# Patient Record
Sex: Male | Born: 1950 | Race: White | Hispanic: No | Marital: Married | Smoking: Former smoker
Health system: Southern US, Community
[De-identification: ages and names within clinical notes are randomized; demographics above are authoritative.]

## PROBLEM LIST (undated history)

## (undated) DIAGNOSIS — C4490 Unspecified malignant neoplasm of skin, unspecified: Secondary | ICD-10-CM

## (undated) DIAGNOSIS — C801 Malignant (primary) neoplasm, unspecified: Secondary | ICD-10-CM

## (undated) DIAGNOSIS — F32A Depression, unspecified: Secondary | ICD-10-CM

## (undated) DIAGNOSIS — I10 Essential (primary) hypertension: Secondary | ICD-10-CM

## (undated) DIAGNOSIS — F329 Major depressive disorder, single episode, unspecified: Secondary | ICD-10-CM

## (undated) DIAGNOSIS — K219 Gastro-esophageal reflux disease without esophagitis: Secondary | ICD-10-CM

## (undated) DIAGNOSIS — R0602 Shortness of breath: Secondary | ICD-10-CM

## (undated) DIAGNOSIS — G4730 Sleep apnea, unspecified: Secondary | ICD-10-CM

## (undated) DIAGNOSIS — Z9289 Personal history of other medical treatment: Secondary | ICD-10-CM

## (undated) DIAGNOSIS — C3490 Malignant neoplasm of unspecified part of unspecified bronchus or lung: Secondary | ICD-10-CM

## (undated) DIAGNOSIS — C8590 Non-Hodgkin lymphoma, unspecified, unspecified site: Secondary | ICD-10-CM

## (undated) DIAGNOSIS — Z87442 Personal history of urinary calculi: Secondary | ICD-10-CM

## (undated) DIAGNOSIS — M1990 Unspecified osteoarthritis, unspecified site: Secondary | ICD-10-CM

## (undated) DIAGNOSIS — F419 Anxiety disorder, unspecified: Secondary | ICD-10-CM

## (undated) DIAGNOSIS — K37 Unspecified appendicitis: Secondary | ICD-10-CM

## (undated) DIAGNOSIS — J189 Pneumonia, unspecified organism: Secondary | ICD-10-CM

## (undated) HISTORY — DX: Non-Hodgkin lymphoma, unspecified, unspecified site: C85.90

## (undated) HISTORY — DX: Unspecified appendicitis: K37

## (undated) HISTORY — PX: APPENDECTOMY: SHX54

## (undated) HISTORY — PX: BASAL CELL CARCINOMA EXCISION: SHX1214

## (undated) HISTORY — PX: TESTICLE REMOVAL: SHX68

## (undated) HISTORY — PX: TONSILLECTOMY: SUR1361

## (undated) HISTORY — PX: SPINE SURGERY: SHX786

## (undated) HISTORY — DX: Major depressive disorder, single episode, unspecified: F32.9

## (undated) HISTORY — PX: FRACTURE SURGERY: SHX138

## (undated) HISTORY — DX: Depression, unspecified: F32.A

## (undated) HISTORY — DX: Anxiety disorder, unspecified: F41.9

## (undated) HISTORY — DX: Unspecified osteoarthritis, unspecified site: M19.90

## (undated) SURGERY — CHEST TUBE INSERTION
Anesthesia: LOCAL

---

## 1972-06-23 DIAGNOSIS — Z9289 Personal history of other medical treatment: Secondary | ICD-10-CM

## 1972-06-23 HISTORY — DX: Personal history of other medical treatment: Z92.89

## 2006-02-10 ENCOUNTER — Ambulatory Visit: Payer: Self-pay | Admitting: Gastroenterology

## 2006-02-19 ENCOUNTER — Ambulatory Visit: Payer: Self-pay | Admitting: Gastroenterology

## 2006-02-19 ENCOUNTER — Encounter (INDEPENDENT_AMBULATORY_CARE_PROVIDER_SITE_OTHER): Payer: Self-pay | Admitting: Specialist

## 2007-05-25 ENCOUNTER — Ambulatory Visit: Payer: Self-pay | Admitting: Gastroenterology

## 2007-06-01 ENCOUNTER — Ambulatory Visit: Payer: Self-pay | Admitting: Gastroenterology

## 2007-06-01 ENCOUNTER — Encounter: Payer: Self-pay | Admitting: Gastroenterology

## 2007-07-15 DIAGNOSIS — K2980 Duodenitis without bleeding: Secondary | ICD-10-CM | POA: Insufficient documentation

## 2007-07-15 DIAGNOSIS — K2990 Gastroduodenitis, unspecified, without bleeding: Secondary | ICD-10-CM

## 2007-07-15 DIAGNOSIS — M129 Arthropathy, unspecified: Secondary | ICD-10-CM | POA: Insufficient documentation

## 2007-07-15 DIAGNOSIS — Z860100 Personal history of colon polyps, unspecified: Secondary | ICD-10-CM | POA: Insufficient documentation

## 2007-07-15 DIAGNOSIS — K2970 Gastritis, unspecified, without bleeding: Secondary | ICD-10-CM | POA: Insufficient documentation

## 2007-07-15 DIAGNOSIS — K5730 Diverticulosis of large intestine without perforation or abscess without bleeding: Secondary | ICD-10-CM | POA: Insufficient documentation

## 2007-07-15 DIAGNOSIS — J309 Allergic rhinitis, unspecified: Secondary | ICD-10-CM | POA: Insufficient documentation

## 2007-07-15 DIAGNOSIS — K648 Other hemorrhoids: Secondary | ICD-10-CM | POA: Insufficient documentation

## 2009-12-10 ENCOUNTER — Encounter: Admission: RE | Admit: 2009-12-10 | Discharge: 2009-12-10 | Payer: Self-pay | Admitting: Internal Medicine

## 2009-12-17 ENCOUNTER — Ambulatory Visit (HOSPITAL_COMMUNITY): Admission: RE | Admit: 2009-12-17 | Discharge: 2009-12-17 | Payer: Self-pay | Admitting: Urology

## 2010-11-05 NOTE — Assessment & Plan Note (Signed)
[facility]                         GASTROENTEROLOGY OFFICE NOTE   BERL, BONFANTI                     MRN:          [id number]  DATE:05/25/2007                            DOB:          April 01, 1951    REFERRING PHYSICIAN:  Stan Head. Cleta Alberts, M.D.   REASON FOR CONSULTATION:  Hemoccult positive stool.   HISTORY OF PRESENT ILLNESS:  Mr. Chad Knapp is a 60 year old white male who  I previously evaluated with a screening colonoscopy in August 2007.  Findings included several hyperplastic polyps, diverticulosis and  internal hemorrhoids.  He takes Naprosyn on a routine basis for back and  shoulder pain.  Hemoccult cards were positive in August 2008.  He notes  chronic problems with intestinal gas.  He notes no change in bowel  habits, change in stool caliber, dysphagia, odynophagia, dyspeptic  symptoms, abdominal pain, rectal pain or weight loss.  No family history  of colon cancer, colon polyps or irritable bowel disease.   PAST MEDICAL HISTORY:  1. Diverticulosis.  2. Internal hemorrhoids.  3. Arthritis.  4. History of nephrolithiasis.  5. Seasonal allergic rhinitis status post TNA.  6. Status post left foot Morton neuroma removed.  7. Status post appendectomy.  8. Status post basal cell carcinoma removed from his chest.   CURRENT MEDICATIONS:  Naprosyn 500 mg b.i.d.   ALLERGIES:  None known.   REVIEW OF SYSTEMS:  Per the hand written form.   PHYSICAL EXAMINATION:  GENERAL:  Well-developed, well-nourished, well-  nourished in no acute distress.  VITAL SIGNS:  Height 6 feet, 3 inches, weight 218.2 pounds.  Blood  pressure 116/78, pulse 72 and regular.  HEENT:  Anicteric sclerae.  Oropharynx clear.  CHEST:  Clear to auscultation bilaterally.  CARDIAC:  Regular rate and rhythm without murmurs appreciated.  ABDOMEN:  Soft, nontender, nondistended.  Normoactive bowel sounds.  No  palpable organomegaly masses or hernias.  EXTREMITIES:  No cyanosis,  clubbing or edema.  NEUROLOGICAL:  Alert and oriented x3.  Grossly nonfocal.   LABORATORY DATA:  From April 06, 2007, shows a normal comprehensive  metabolic panel, lipid panel, CBC and PSA except for an elevated LDL at  119.   ASSESSMENT/PLAN:  Asymptomatic hemoccult positive stool on chronic  Naprosyn therapy.  Colonoscopy performed in August 2007.  Rule out NSAID  gastrointestinal injury.  Risks, benefits and alternatives to upper  endoscopy discussed with the patient, and he consents to proceed.  This  will be scheduled electively.     Venita Lick. Russella Dar, MD, [facility]  Electronically Signed    MTS/MedQ  DD: 05/31/2007  DT: 05/31/2007  Job #: [id number]   cc:   Brett Canales A. Cleta Alberts, M.D.

## 2011-11-18 ENCOUNTER — Ambulatory Visit (INDEPENDENT_AMBULATORY_CARE_PROVIDER_SITE_OTHER): Payer: Federal, State, Local not specified - PPO | Admitting: Family Medicine

## 2011-11-18 VITALS — BP 111/76 | HR 82 | Temp 98.0°F | Resp 16 | Ht 74.0 in | Wt 247.4 lb

## 2011-11-18 DIAGNOSIS — R05 Cough: Secondary | ICD-10-CM

## 2011-11-18 DIAGNOSIS — R059 Cough, unspecified: Secondary | ICD-10-CM

## 2011-11-18 DIAGNOSIS — J329 Chronic sinusitis, unspecified: Secondary | ICD-10-CM

## 2011-11-18 DIAGNOSIS — J302 Other seasonal allergic rhinitis: Secondary | ICD-10-CM

## 2011-11-18 DIAGNOSIS — J309 Allergic rhinitis, unspecified: Secondary | ICD-10-CM

## 2011-11-18 DIAGNOSIS — J029 Acute pharyngitis, unspecified: Secondary | ICD-10-CM

## 2011-11-18 MED ORDER — FLUTICASONE PROPIONATE 50 MCG/ACT NA SUSP: 2.0000 | Freq: Every day | NASAL | Status: DC

## 2011-11-18 MED ORDER — HYDROCODONE-HOMATROPINE 5-1.5 MG/5ML PO SYRP: 5.0000 mL | ORAL_SOLUTION | Freq: Three times a day (TID) | ORAL | Status: AC | PRN

## 2011-11-18 MED ORDER — AMOXICILLIN 875 MG PO TABS: 875.0000 mg | ORAL_TABLET | Freq: Two times a day (BID) | ORAL | Status: AC

## 2011-11-18 NOTE — Progress Notes (Signed)
  Subjective:    Patient ID: Chad Knapp, male    DOB: Oct 20, 1950, 61 y.o.   MRN: [id number]  HPI  Patient presents with 3 week history of  facial congestion and pressure, PND and cough.  PMH/ seasonal allergies  SH/ former smoker  Review of Systems  Constitutional: Negative for fever and chills.  HENT: Positive for congestion, rhinorrhea and postnasal drip. Negative for ear pain and facial swelling.   Respiratory: Positive for cough. Negative for shortness of breath.        Objective:   Physical Exam  Constitutional: He appears well-developed.  HENT:       Nares boggy (B)   Neck: Neck supple.  Cardiovascular: Normal rate, regular rhythm and normal heart sounds.   Pulmonary/Chest: Effort normal.       Coarse BS  Abdominal: Soft. Bowel sounds are normal.  Skin: Skin is warm.         Assessment & Plan:   1. Sinusitis  amoxicillin (AMOXIL) 875 MG tablet  2. Seasonal allergies  fluticasone (FLONASE) 50 MCG/ACT nasal spray  3. Cough  HYDROcodone-homatropine (HYCODAN) 5-1.5 MG/5ML syrup; ProAir HFA 1-2 puffs q 6 hours prn(has at home)    Anticipatory guidance

## 2011-11-20 ENCOUNTER — Encounter: Payer: Self-pay | Admitting: Family Medicine

## 2012-09-23 ENCOUNTER — Ambulatory Visit (INDEPENDENT_AMBULATORY_CARE_PROVIDER_SITE_OTHER): Payer: Federal, State, Local not specified - PPO | Admitting: Family Medicine

## 2012-09-23 VITALS — BP 120/90 | HR 103 | Temp 99.0°F | Resp 20 | Ht 74.0 in | Wt 247.0 lb

## 2012-09-23 DIAGNOSIS — J019 Acute sinusitis, unspecified: Secondary | ICD-10-CM

## 2012-09-23 MED ORDER — AZITHROMYCIN 250 MG PO TABS: ORAL_TABLET | ORAL | Status: DC

## 2012-09-23 MED ORDER — HYDROCODONE-HOMATROPINE 5-1.5 MG/5ML PO SYRP: 5.0000 mL | ORAL_SOLUTION | Freq: Four times a day (QID) | ORAL | Status: DC | PRN

## 2012-09-23 NOTE — Progress Notes (Signed)
62 year old gentleman who comes in with 1 week of progressive sinus congestion. He's also coughing frequently. He tried Mucinex and this has not helped him. He said no fever, hemoptysis, nausea or vomiting  Objective: Marked nasal passage congestion HEENT otherwise negative Chest: Few rhonchi otherwise clear  Assessment: Sinusitis with cough  Plan: No diagnosis found. Z-Pak and Hydromet with followup in 3 days if not better

## 2012-09-29 ENCOUNTER — Ambulatory Visit (INDEPENDENT_AMBULATORY_CARE_PROVIDER_SITE_OTHER): Payer: Federal, State, Local not specified - PPO | Admitting: Emergency Medicine

## 2012-09-29 VITALS — BP 120/78 | HR 73 | Temp 98.3°F | Resp 18 | Ht 74.0 in | Wt 250.8 lb

## 2012-09-29 DIAGNOSIS — J209 Acute bronchitis, unspecified: Secondary | ICD-10-CM

## 2012-09-29 MED ORDER — HYDROCOD POLST-CHLORPHEN POLST 10-8 MG/5ML PO LQCR: 5.0000 mL | Freq: Two times a day (BID) | ORAL | Status: DC | PRN

## 2012-09-29 NOTE — Progress Notes (Signed)
[facility] [address], [place] [address] [phone]  Date:  09/29/2012   Name:  Chad Knapp   DOB:  July 17, 1950   MRN:  [id number]  PCP:  No PCP Per Patient    Chief Complaint: Follow-up   History of Present Illness:  Chad Knapp is a 62 y.o. very pleasant male patient who presents with the following:  Seen a week ago and treated with zithromax for sinusitis.  Has persistent cough. Productive mucoid sputum. Clear mucoid nasal discharge.  No wheezing or shortness of breath.  No fever or chills.  No nausea or vomiting.  Completed course of zithromax and tussionex   No improvement with over the counter medications or other home remedies. Denies other complaint or health concern today.   Patient Active Problem List  Diagnosis  . HEMORRHOIDS, INTERNAL  . RHINITIS  . GASTRITIS  . DUODENITIS WITHOUT MENTION OF HEMORRHAGE  . DIVERTICULOSIS OF COLON  . ARTHRITIS  . COLONIC POLYPS, HYPERPLASTIC, HX OF    Past Medical History  Diagnosis Date  . Arthritis   . Depression   . Anxiety     Past Surgical History  Procedure Laterality Date  . Appendectomy    . Fracture surgery      History  Substance Use Topics  . Smoking status: Former Games developer  . Smokeless tobacco: Not on file  . Alcohol Use: 25.2 oz/week    42 Cans of beer per week    No family history on file.  No Known Allergies  Medication list has been reviewed and updated.  Current Outpatient Prescriptions on File Prior to Visit  Medication Sig Dispense Refill  . aspirin 81 MG tablet Take 81 mg by mouth daily.      Marland Kitchen azithromycin (ZITHROMAX) 250 MG tablet Take 2 tablets tonight. Take one tablet every day for the next 4 days.  6 each  0  . citalopram (CELEXA) 20 MG tablet Take 20 mg by mouth daily.      . fish oil-omega-3 fatty acids 1000 MG capsule Take 2 g by mouth daily.      Marland Kitchen ketoconazole (NIZORAL) 2 % cream Apply topically daily.      . multivitamin/minerals (GOLDEN AGE)  LIQD Take by mouth daily.      . naproxen (NAPROSYN) 500 MG tablet Take 500 mg by mouth 2 (two) times daily with a meal.      . dextromethorphan-guaiFENesin (MUCINEX DM) 30-600 MG per 12 hr tablet Take 1 tablet by mouth every 12 (twelve) hours.      . fluticasone (FLONASE) 50 MCG/ACT nasal spray Place 2 sprays into the nose daily.  1 g  6  . HYDROcodone-homatropine (HYCODAN) 5-1.5 MG/5ML syrup Take 5 mLs by mouth every 6 (six) hours as needed for cough.  120 mL  0   No current facility-administered medications on file prior to visit.    Review of Systems:  As per HPI, otherwise negative.    Physical Examination: Filed Vitals:   09/29/12 1830  BP: 120/78  Pulse: 73  Temp: 98.3 F (36.8 C)  Resp: 18   Filed Vitals:   09/29/12 1830  Height: 6\' 2"  (1.88 m)  Weight: 250 lb 12.8 oz (113.762 kg)   Body mass index is 32.19 kg/(m^2). Ideal Body Weight: Weight in (lb) to have BMI = 25: 194.3  GEN: WDWN, NAD, Non-toxic, A & O x 3 HEENT: Atraumatic, Normocephalic. Neck supple. No masses, No LAD. Ears and  Nose: No external deformity. CV: RRR, No M/G/R. No JVD. No thrill. No extra heart sounds. PULM: CTA B, no wheezes, crackles, rhonchi. No retractions. No resp. distress. No accessory muscle use. ABD: S, NT, ND, +BS. No rebound. No HSM. EXTR: No c/c/e NEURO Normal gait.  PSYCH: Normally interactive. Conversant. Not depressed or anxious appearing.  Calm demeanor.    Assessment and Plan: Bronchitis Bronchospasm Use MDI Refill tussionex   Signed,  Phillips Odor, MD

## 2012-09-29 NOTE — Patient Instructions (Signed)

## 2012-11-08 ENCOUNTER — Ambulatory Visit (INDEPENDENT_AMBULATORY_CARE_PROVIDER_SITE_OTHER): Payer: Federal, State, Local not specified - PPO | Admitting: Family Medicine

## 2012-11-08 VITALS — BP 122/72 | HR 86 | Temp 97.7°F | Resp 16 | Ht 75.0 in | Wt 256.0 lb

## 2012-11-08 DIAGNOSIS — M549 Dorsalgia, unspecified: Secondary | ICD-10-CM

## 2012-11-08 DIAGNOSIS — M129 Arthropathy, unspecified: Secondary | ICD-10-CM

## 2012-11-08 DIAGNOSIS — M5410 Radiculopathy, site unspecified: Secondary | ICD-10-CM

## 2012-11-08 DIAGNOSIS — M546 Pain in thoracic spine: Secondary | ICD-10-CM

## 2012-11-08 DIAGNOSIS — M542 Cervicalgia: Secondary | ICD-10-CM

## 2012-11-08 MED ORDER — HYDROCODONE-ACETAMINOPHEN 5-325 MG PO TABS: 1.0000 | ORAL_TABLET | Freq: Four times a day (QID) | ORAL | Status: DC | PRN

## 2012-11-08 MED ORDER — PREDNISONE 10 MG PO TABS: ORAL_TABLET | ORAL | Status: DC

## 2012-11-08 MED ORDER — CYCLOBENZAPRINE HCL 10 MG PO TABS: 10.0000 mg | ORAL_TABLET | Freq: Three times a day (TID) | ORAL | Status: DC | PRN
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
## 2012-11-08 NOTE — Patient Instructions (Addendum)
Back Injury Prevention Back injuries can be extremely painful and difficult to heal. After having one back injury, you are much more likely to experience another later on. It is important to learn how to avoid injuring or re-injuring your back. The following tips can help you to prevent a back injury. PHYSICAL FITNESS  Exercise regularly and try to develop good tone in your abdominal muscles. Your abdominal muscles provide a lot of the support needed by your back.  Do aerobic exercises (walking, jogging, biking, swimming) regularly.  Do exercises that increase balance and strength (tai chi, yoga) regularly. This can decrease your risk of falling and injuring your back.  Stretch before and after exercising.  Maintain a healthy weight. The more you weigh, the more stress is placed on your back. For every pound of weight, 10 times that amount of pressure is placed on the back. DIET  Talk to your caregiver about how much calcium and vitamin D you need per day. These nutrients help to prevent weakening of the bones (osteoporosis). Osteoporosis can cause broken (fractured) bones that lead to back pain.  Include good sources of calcium in your diet, such as dairy products, green, leafy vegetables, and products with calcium added (fortified).  Include good sources of vitamin D in your diet, such as milk and foods that are fortified with vitamin D.  Consider taking a nutritional supplement or a multivitamin if needed.  Stop smoking if you smoke. POSTURE  Sit and stand up straight. Avoid leaning forward when you sit or hunching over when you stand.  Choose chairs with good low back (lumbar) support.  If you work at a desk, sit close to your work so you do not need to lean over. Keep your chin tucked in. Keep your neck drawn back and elbows bent at a right angle. Your arms should look like the letter "L."  Sit high and close to the steering wheel when you drive. Add a lumbar support to your car  seat if needed.  Avoid sitting or standing in one position for too long. Take breaks to get up, stretch, and walk around at least once every hour. Take breaks if you are driving for long periods of time.  Sleep on your side with your knees slightly bent, or sleep on your back with a pillow under your knees. Do not sleep on your stomach. LIFTING, TWISTING, AND REACHING  Avoid heavy lifting, especially repetitive lifting. If you must do heavy lifting:  Stretch before lifting.  Work slowly.  Rest between lifts.  Use carts and dollies to move objects when possible.  Make several small trips instead of carrying 1 heavy load.  Ask for help when you need it.  Ask for help when moving big, awkward objects.  Follow these steps when lifting:  Stand with your feet shoulder-width apart.  Get as close to the object as you can. Do not try to pick up heavy objects that are far from your body.  Use handles or lifting straps if they are available.  Bend at your knees. Squat down, but keep your heels off the floor.  Keep your shoulders pulled back, your chin tucked in, and your back straight.  Lift the object slowly, tightening the muscles in your legs, abdomen, and buttocks. Keep the object as close to the center of your body as possible.  When you put a load down, use these same guidelines in reverse.  Do not:  Lift the object above your waist.    Twist at the waist while lifting or carrying a load. Move your feet if you need to turn, not your waist.  Bend over without bending at your knees.  Avoid reaching over your head, across a table, or for an object on a high surface. OTHER TIPS  Avoid wet floors and keep sidewalks clear of ice to prevent falls.  Do not sleep on a mattress that is too soft or too hard.  Keep items that are used frequently within easy reach.  Put heavier objects on shelves at waist level and lighter objects on lower or higher shelves.  Find ways to  decrease your stress, such as exercise, massage, or relaxation techniques. Stress can build up in your muscles. Tense muscles are more vulnerable to injury.  Seek treatment for depression or anxiety if needed. These conditions can increase your risk of developing back pain. SEEK MEDICAL CARE IF:  You injure your back.  You have questions about diet, exercise, or other ways to prevent back injuries. MAKE SURE YOU:  Understand these instructions.  Will watch your condition.  Will get help right away if you are not doing well or get worse. Document Released: 07/17/2004 Document Revised: 09/01/2011 Document Reviewed: 07/21/2011 [facility] Patient Information 2013 [place], [place]. Back Exercises These exercises may help you when beginning to rehabilitate your injury. Your symptoms may resolve with or without further involvement from your physician, physical therapist or athletic trainer. While completing these exercises, remember:   Restoring tissue flexibility helps normal motion to return to the joints. This allows healthier, less painful movement and activity.  An effective stretch should be held for at least 30 seconds.  A stretch should never be painful. You should only feel a gentle lengthening or release in the stretched tissue. STRETCH  Extension, Prone on Elbows   Lie on your stomach on the floor, a bed will be too soft. Place your palms about shoulder width apart and at the height of your head.  Place your elbows under your shoulders. If this is too painful, stack pillows under your chest.  Allow your body to relax so that your hips drop lower and make contact more completely with the floor.  Hold this position for __________ seconds.  Slowly return to lying flat on the floor. Repeat __________ times. Complete this exercise __________ times per day.  RANGE OF MOTION  Extension, Prone Press Ups   Lie on your stomach on the floor, a bed will be too soft. Place your palms about  shoulder width apart and at the height of your head.  Keeping your back as relaxed as possible, slowly straighten your elbows while keeping your hips on the floor. You may adjust the placement of your hands to maximize your comfort. As you gain motion, your hands will come more underneath your shoulders.  Hold this position __________ seconds.  Slowly return to lying flat on the floor. Repeat __________ times. Complete this exercise __________ times per day.  RANGE OF MOTION- Quadruped, Neutral Spine   Assume a hands and knees position on a firm surface. Keep your hands under your shoulders and your knees under your hips. You may place padding under your knees for comfort.  Drop your head and point your tail bone toward the ground below you. This will round out your low back like an angry cat. Hold this position for __________ seconds.  Slowly lift your head and release your tail bone so that your back sags into a large arch, like an  old horse.  Hold this position for __________ seconds.  Repeat this until you feel limber in your low back.  Now, find your "sweet spot." This will be the most comfortable position somewhere between the two previous positions. This is your neutral spine. Once you have found this position, tense your stomach muscles to support your low back.  Hold this position for __________ seconds. Repeat __________ times. Complete this exercise __________ times per day.  STRETCH  Flexion, Single Knee to Chest   Lie on a firm bed or floor with both legs extended in front of you.  Keeping one leg in contact with the floor, bring your opposite knee to your chest. Hold your leg in place by either grabbing behind your thigh or at your knee.  Pull until you feel a gentle stretch in your low back. Hold __________ seconds.  Slowly release your grasp and repeat the exercise with the opposite side. Repeat __________ times. Complete this exercise __________ times per day.    STRETCH - Hamstrings, Standing  Stand or sit and extend your right / left leg, placing your foot on a chair or foot stool  Keeping a slight arch in your low back and your hips straight forward.  Lead with your chest and lean forward at the waist until you feel a gentle stretch in the back of your right / left knee or thigh. (When done correctly, this exercise requires leaning only a small distance.)  Hold this position for __________ seconds. Repeat __________ times. Complete this stretch __________ times per day. STRENGTHENING  Deep Abdominals, Pelvic Tilt   Lie on a firm bed or floor. Keeping your legs in front of you, bend your knees so they are both pointed toward the ceiling and your feet are flat on the floor.  Tense your lower abdominal muscles to press your low back into the floor. This motion will rotate your pelvis so that your tail bone is scooping upwards rather than pointing at your feet or into the floor.  With a gentle tension and even breathing, hold this position for __________ seconds. Repeat __________ times. Complete this exercise __________ times per day.  STRENGTHENING  Abdominals, Crunches   Lie on a firm bed or floor. Keeping your legs in front of you, bend your knees so they are both pointed toward the ceiling and your feet are flat on the floor. Cross your arms over your chest.  Slightly tip your chin down without bending your neck.  Tense your abdominals and slowly lift your trunk high enough to just clear your shoulder blades. Lifting higher can put excessive stress on the low back and does not further strengthen your abdominal muscles.  Control your return to the starting position. Repeat __________ times. Complete this exercise __________ times per day.  STRENGTHENING  Quadruped, Opposite UE/LE Lift   Assume a hands and knees position on a firm surface. Keep your hands under your shoulders and your knees under your hips. You may place padding under your  knees for comfort.  Find your neutral spine and gently tense your abdominal muscles so that you can maintain this position. Your shoulders and hips should form a rectangle that is parallel with the floor and is not twisted.  Keeping your trunk steady, lift your right hand no higher than your shoulder and then your left leg no higher than your hip. Make sure you are not holding your breath. Hold this position __________ seconds.  Continuing to keep your abdominal muscles  tense and your back steady, slowly return to your starting position. Repeat with the opposite arm and leg. Repeat __________ times. Complete this exercise __________ times per day. Document Released: 06/27/2005 Document Revised: 09/01/2011 Document Reviewed: 09/21/2008 [facility] Patient Information 2013 [place], [place].

## 2012-11-22 ENCOUNTER — Ambulatory Visit (INDEPENDENT_AMBULATORY_CARE_PROVIDER_SITE_OTHER): Payer: Federal, State, Local not specified - PPO | Admitting: Family Medicine

## 2012-11-22 ENCOUNTER — Ambulatory Visit: Payer: Federal, State, Local not specified - PPO

## 2012-11-22 VITALS — BP 124/88 | HR 94 | Temp 97.8°F | Resp 18 | Ht 75.0 in | Wt 254.0 lb

## 2012-11-22 DIAGNOSIS — M546 Pain in thoracic spine: Secondary | ICD-10-CM

## 2012-11-22 DIAGNOSIS — M5010 Cervical disc disorder with radiculopathy, unspecified cervical region: Secondary | ICD-10-CM

## 2012-11-22 DIAGNOSIS — M5412 Radiculopathy, cervical region: Secondary | ICD-10-CM

## 2012-11-22 MED ORDER — TRIAMCINOLONE ACETONIDE 40 MG/ML IJ SUSP
40.0000 mg | Freq: Once | INTRAMUSCULAR | Status: AC
  Administered 2012-11-22: 40 mg via INTRAMUSCULAR

## 2012-11-22 MED ORDER — OXYCODONE-ACETAMINOPHEN 5-325 MG PO TABS: 1.0000 | ORAL_TABLET | Freq: Four times a day (QID) | ORAL | Status: DC | PRN

## 2012-11-22 MED ORDER — AMITRIPTYLINE HCL 50 MG PO TABS: 50.0000 mg | ORAL_TABLET | Freq: Every day | ORAL | Status: DC

## 2012-11-22 NOTE — Progress Notes (Signed)
Subjective:    Patient ID: Chad Knapp, male    DOB: 1950-12-13, 62 y.o.   MRN: [id number] Chief Complaint  Patient presents with  . Shoulder Pain    Recheck  . Arm Pain   HPI  Better than it was 2 wks ago - down to 7/10 from 10/10 - pain from shoulder blade radiating down into forearm and 2nd finger numb and feels like it has needles in it.  Hydrocodone and flexeril just knocked off the edge of it -  Couldn't tell any difference on the prednisone.  Naproxen doesn't touch it. Has a point on his posterior shoulder that he can press and it radiates down his back. He can psuh certain areas of his forearm that if he pushes his left 2nd finger becomes more numb. Did get a skin biopsy at the [place]. They did a subcu injection with lidocaine that temp gave his whole arm relief but now wearing off. Turning towards the left with his neck causes more pain and laying on left side is very bad (laying on back or right is ok) No ortho doc Right handed so doesn't know if any weakness in left.  Past Medical History  Diagnosis Date  . Arthritis   . Depression   . Anxiety    Current Outpatient Prescriptions on File Prior to Visit  Medication Sig Dispense Refill  . aspirin 81 MG tablet Take 81 mg by mouth daily.      . citalopram (CELEXA) 20 MG tablet Take 20 mg by mouth daily.      . fish oil-omega-3 fatty acids 1000 MG capsule Take 2 g by mouth daily.      Marland Kitchen ketoconazole (NIZORAL) 2 % cream Apply topically daily.      . multivitamin/minerals (GOLDEN AGE) LIQD Take by mouth daily.      . naproxen (NAPROSYN) 500 MG tablet Take 500 mg by mouth 2 (two) times daily with a meal.      . fluticasone (FLONASE) 50 MCG/ACT nasal spray Place 2 sprays into the nose daily.  1 g  6   No current facility-administered medications on file prior to visit.   No Known Allergies  Review of Systems  Constitutional: Positive for activity change. Negative for fever, chills, diaphoresis and appetite change.  HENT:  Negative for neck pain and neck stiffness.   Musculoskeletal: Positive for myalgias, back pain and arthralgias. Negative for joint swelling and gait problem.  Skin: Negative for color change, rash and wound.  Neurological: Positive for numbness. Negative for tremors and weakness.  Hematological: Negative for adenopathy. Does not bruise/bleed easily.  Psychiatric/Behavioral: Positive for sleep disturbance.      BP 124/88  Pulse 94  Temp(Src) 97.8 F (36.6 C) (Oral)  Resp 18  Ht 6\' 3"  (1.905 m)  Wt 254 lb (115.214 kg)  BMI 31.75 kg/m2  SpO2 96% Objective:   Physical Exam  Constitutional: He is oriented to person, place, and time. He appears well-developed and well-nourished. No distress.  HENT:  Head: Normocephalic and atraumatic.  Eyes: No scleral icterus.  Pulmonary/Chest: Effort normal.  Musculoskeletal:       Left shoulder: He exhibits pain, spasm and decreased strength. He exhibits normal range of motion, no bony tenderness, no swelling and no effusion.       Left wrist: He exhibits normal range of motion, no tenderness and no bony tenderness.       Cervical back: He exhibits decreased range of motion and spasm. He exhibits  no tenderness, no bony tenderness and no pain.       Left hand: Decreased sensation noted. Decreased sensation is present in the radial distribution. Normal strength noted. He exhibits no finger abduction, no thumb/finger opposition and no wrist extension trouble.  Markedly decreased ROM with ear to shoulder and cervical extension.  Neurological: He is alert and oriented to person, place, and time. He displays no atrophy. He exhibits abnormal muscle tone.  Reflex Scores:      Tricep reflexes are 2+ on the right side and 2+ on the left side.      Bicep reflexes are 2+ on the right side and 2+ on the left side.      Brachioradialis reflexes are 2+ on the right side and 2+ on the left side. Neg Tinel's and Phalen's  Skin: Skin is warm and dry. He is not  diaphoretic.  Psychiatric: He has a normal mood and affect. His behavior is normal.     [facility] reading (PRIMARY) by  Dr. Clelia Croft. C-spine: C6 anterior inferior edge with bony spur separate - send for stat over-read - Mild Deg change only  Verbal consent obtained. Area of greatest pain over left upper trap cleaned w/ betadine x 2 and anesthetized with cold spray.  40mg  of Kenolog w/ 2cc 1% lidocaine mixed injected IM and fanned out with 25g needle. Pt tolerated procedure well. No comp, no EBL. Assessment & Plan:  Back pain, thoracic - Plan: DG Cervical Spine 2 or 3 views, triamcinolone acetonide (KENALOG-40) injection 40 mg - Attempted trigger point injection today for diagnostic and therapeutic reasons  Cervical disc disorder with radiculopathy of cervical region - Plan: Ambulatory referral to Orthopedic Surgery - I am concerned pt is having a C6 radiculopathy from his neck - though he localizes most of his pain to his shoulder and neck xray benign so could be more of a brachial plexus issue - will ask ortho to eval pt to help determine etiology of sxs and due to no sig improvement over 4-5 wks of pain now.  In the meantime, will start trial of elavil qhs and try percocet prn during the day.  Note given for 1 wk out of work.  Meds ordered this encounter  Medications  . triamcinolone acetonide (KENALOG-40) injection 40 mg    Sig:   . amitriptyline (ELAVIL) 50 MG tablet    Sig: Take 1 tablet (50 mg total) by mouth at bedtime.    Dispense:  30 tablet    Refill:  1  . oxyCODONE-acetaminophen (ROXICET) 5-325 MG per tablet    Sig: Take 1 tablet by mouth every 6 (six) hours as needed for pain.    Dispense:  40 tablet    Refill:  0

## 2013-01-12 NOTE — H&P (Signed)
History of Present Illness The patient is a 62 year old male who presents with neck pain. The patient is seen today in referral from [facility] [address]. The patient reports symptoms involving the left lateral neck which began 3 week(s) ago. The symptoms began without any known injury. Symptoms include neck pain, shoulder pain (left) and numbness (left index finger). The pain radiates to the left shoulder and left forearm. The patient describes the pain as sharp, dull and throbbing (at times with use). Onset was gradual. The symptoms occur frequently.The patient does feel that the symptoms are unchanged. Prior to being seen today the patient was previously evaluated in urgent care. Past evaluation has included cervical spine x-rays. Past treatment has included opioid analgesics and spinal injections (cortisone).  Subjective Transcription  The patient does not have a family physician. He presents with a three week history of atraumatic neck pain and radiation into the left arm. He states he is right hand dominant but he has been having horrific left arm pain. He was referred to me by the Urgent Care.      Family History Cancer. father   Social History Alcohol use. current drinker; drinks beer; less than 5 per week Children. 2 Current work status. working full time Drug/Alcohol Rehab (Currently). no Drug/Alcohol Rehab (Previously). no Exercise. Exercises rarely; does running / walking Illicit drug use. no Living situation. live with spouse Marital status. married Number of flights of stairs before winded. 2-3 Pain Contract. no Tobacco / smoke exposure. no Tobacco use. former smoker; smoke(d) less than 1/2 pack(s) per day   Past Surgical History Appendectomy Foot Surgery. left Other Orthopaedic Surgery Tonsillectomy   Other Problems Anxiety Disorder Diverticulitis Of Colon Kidney Stone Skin Cancer Sleep Apnea   Review  of Systems General:Present- Fatigue. Not Present- Chills, Fever, Night Sweats, Appetite Loss, Feeling sick, Weight Gain and Weight Loss. Respiratory:Present- Dyspnea. Not Present- Snoring, Chronic Cough and Bloody sputum. Cardiovascular:Present- Shortness of Breath. Not Present- Chest Pain, Swelling of Extremities, Leg Cramps and Palpitations. Musculoskeletal:Present- Back Pain. Not Present- Muscle Weakness, Muscle Pain, Joint Stiffness, Joint Swelling and Joint Pain. Neurological:Present- Numbness. Not Present- Tingling, Burning, Tremor, Headaches and Dizziness.   Vitals 11/25/2012 10:22 AM Weight: 254 lb Height: 75 in Body Surface Area: 2.47 m Body Mass Index: 31.75 kg/m BP: 124/88 (Sitting, Left Arm, Standard)    Objective Transcription  He is a pleasant gentleman appearing younger than his stated age. Alert and oriented times three. He has neck pain with range of motion to palpation. He does not have any significant shoulder, elbow or wrist pain with range of motion. He does have a trace weakness of the biceps and triceps on the left side and diminished sensation to light touch, predominantly in the C6 distribution. He ambulates well without any gait abnormality. Negative Hoffman's sign. There are 2+ symmetrical DTR's. Negative Babinski. No clonus. Normal gait pattern. He has a positive Spurling's sign on the left side.   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 A C2-3 there is a left sided disc hard disc osteophyte, at C3-4 there is a left hard disc osteophyte, both causing moderate to severe left foraminal narrowing. There is also left facet arthrosis that is moderate to severe at both those levels. C4-5 there is minimal disease. C5-6 there is facet arthrosis that is moderate to severe on the left with advanced left facet arthrosis. At C6-7 there is central and to the left hard disc osteophyte causing foraminal  stenosis.  Plans  Transcription(Byrd Rushlow Sheela Stack, MD; 12/17/2012 11:50 AM)  At this point in time the patient's clinical symptoms are consistent with C6-7 radiculopathy. While he does have significant problems in the upper cervical spine, I do not think these are causing his radicular symptoms in the arm. He has negative Babinski test. Negative Hoffman's sign. No clonus. Normal gait pattern. There is no evidence of suggest cervical spondylitic myelopathy and further more there are no cord signal changes.    At this point I would like to try a cervical epidural steroid injection specifically a C7 selective nerve root block. The patient is focused on mostly triceps and extensor surface of the forearm pain consistent with C7 nerve distribution. If there is significant improvement with this then we can at least begin to localize his pain source. I would not be surprised if there is only partial recovery as he has multiple areas, but clinically this is here he complains the most of. I will see him back after he has the C7 selective nerve root block and then we can determine how best to proceed at that time.  Patient noted marked temp. Improvement after injection.  Will proceed with ACDF of C6/7.  l Risks of surgery include, but are not limited to: Throat pain,swallowing difficulty, hoarseness or change in voice, Death, stroke, paralysis, nerve root damage/injury, bleeding, blood clots, loss of bowel/bladder control, hardware failure, or malposition, spinal fluid leak, adjacent segment disease, non-union, need for further surgery, ongoing or worse pain, infection and recurrent disc herniation

## 2013-01-24 ENCOUNTER — Encounter (HOSPITAL_COMMUNITY): Payer: Self-pay | Admitting: Pharmacy Technician

## 2013-01-24 ENCOUNTER — Encounter: Payer: Self-pay | Admitting: Gastroenterology

## 2013-01-27 ENCOUNTER — Encounter (HOSPITAL_COMMUNITY): Payer: Self-pay

## 2013-01-27 ENCOUNTER — Encounter (HOSPITAL_COMMUNITY)
Admission: RE | Admit: 2013-01-27 | Discharge: 2013-01-27 | Disposition: A | Discharge status: Home / Self Care | Payer: Federal, State, Local not specified - PPO | Source: Ambulatory Visit | Attending: Orthopedic Surgery | Admitting: Orthopedic Surgery

## 2013-01-27 ENCOUNTER — Other Ambulatory Visit (HOSPITAL_COMMUNITY): Payer: Federal, State, Local not specified - PPO

## 2013-01-27 DIAGNOSIS — Z01812 Encounter for preprocedural laboratory examination: Secondary | ICD-10-CM | POA: Insufficient documentation

## 2013-01-27 DIAGNOSIS — Z01818 Encounter for other preprocedural examination: Secondary | ICD-10-CM | POA: Insufficient documentation

## 2013-01-27 HISTORY — DX: Personal history of other medical treatment: Z92.89

## 2013-01-27 HISTORY — DX: Sleep apnea, unspecified: G47.30

## 2013-01-27 HISTORY — DX: Malignant (primary) neoplasm, unspecified: C80.1

## 2013-01-27 LAB — COMPREHENSIVE METABOLIC PANEL
AST: 21 U/L (ref 0–37)
CO2: 23 mEq/L (ref 19–32)
Calcium: 9.2 mg/dL (ref 8.4–10.5)
Creatinine, Ser: 0.9 mg/dL (ref 0.50–1.35)
GFR calc Af Amer: >90 mL/min (ref >90)
GFR calc non Af Amer: 89 mL/min — ABNORMAL LOW (ref >90)
Glucose, Bld: 103 mg/dL — ABNORMAL HIGH (ref 70–99)

## 2013-01-27 LAB — CBC
MCH: 32 pg (ref 26.0–34.0)
MCV: 90.7 fL (ref 78.0–100.0)
Platelets: 145 10*3/uL — ABNORMAL LOW (ref 150–400)
RBC: 4.94 MIL/uL (ref 4.22–5.81)

## 2013-01-27 NOTE — Progress Notes (Signed)
Primary physician - [facility] on [address] Does not have cardiologist Echo at [facility] - patient states was normal

## 2013-01-27 NOTE — Pre-Procedure Instructions (Addendum)
Chad Knapp  01/27/2013   Your procedure is scheduled on:  Thursday, August 14th  Report to [name] at 0530 AM.  Call this number if you have problems the morning of surgery: [phone]   Remember:   Do not eat food or drink liquids after midnight.   Take these medicines the morning of surgery with A SIP OF WATER: Celexa, Flonase, Albuterol if needed Stop taking aspirin, fish oil, naproxen 5 days prior to surgery   Do not wear jewelry.  Do not wear lotions, powders, or perfume, deodorant.  Do not bring valuables to the hospital.  [facility] is not responsible  for any belongings or valuables.  Contacts, dentures or bridgework may not be worn into surgery.  Leave suitcase in the car. After surgery it may be brought to your room.  For patients admitted to the hospital, checkout time is 11:00 AM the day of discharge.   Patients discharged the day of surgery will not be allowed to drive home.   Special Instructions: Shower using CHG 2 nights before surgery and the night before surgery.  If you shower the day of surgery use CHG.  Use special wash - you have one bottle of CHG for all showers.  You should use approximately 1/3 of the bottle for each shower.   Please read over the following fact sheets that you were given: Pain Booklet, Coughing and Deep Breathing, MRSA Information and Surgical Site Infection Prevention

## 2013-02-02 MED ORDER — CEFAZOLIN SODIUM-DEXTROSE 2-3 GM-% IV SOLR
2.0000 g | INTRAVENOUS | Status: AC
  Administered 2013-02-03: 2 g via INTRAVENOUS
  Filled 2013-02-02: qty 50

## 2013-02-02 MED ORDER — DEXAMETHASONE SODIUM PHOSPHATE 4 MG/ML IJ SOLN
4.0000 mg | Freq: Once | INTRAMUSCULAR | Status: AC
  Administered 2013-02-03: 4 mg via INTRAVENOUS
  Filled 2013-02-02: qty 1

## 2013-02-02 MED ORDER — ACETAMINOPHEN 10 MG/ML IV SOLN
1000.0000 mg | Freq: Once | INTRAVENOUS | Status: AC
  Administered 2013-02-03: 1000 mg via INTRAVENOUS
  Filled 2013-02-02: qty 100

## 2013-02-03 ENCOUNTER — Observation Stay (HOSPITAL_COMMUNITY): Payer: Federal, State, Local not specified - PPO

## 2013-02-03 ENCOUNTER — Encounter (HOSPITAL_COMMUNITY): Payer: Self-pay | Admitting: Certified Registered Nurse Anesthetist

## 2013-02-03 ENCOUNTER — Encounter (HOSPITAL_COMMUNITY)
Admission: RE | Disposition: A | Discharge status: Home / Self Care | Payer: Self-pay | Source: Ambulatory Visit | Attending: Orthopedic Surgery

## 2013-02-03 ENCOUNTER — Ambulatory Visit (HOSPITAL_COMMUNITY): Payer: Federal, State, Local not specified - PPO | Admitting: Anesthesiology

## 2013-02-03 ENCOUNTER — Observation Stay (HOSPITAL_COMMUNITY)
Admission: RE | Admit: 2013-02-03 | Discharge: 2013-02-04 | Disposition: A | Discharge status: Home / Self Care | Payer: Federal, State, Local not specified - PPO | Source: Ambulatory Visit | Attending: Orthopedic Surgery | Admitting: Orthopedic Surgery

## 2013-02-03 ENCOUNTER — Encounter (HOSPITAL_COMMUNITY): Payer: Self-pay | Admitting: Anesthesiology

## 2013-02-03 DIAGNOSIS — Z01812 Encounter for preprocedural laboratory examination: Secondary | ICD-10-CM | POA: Insufficient documentation

## 2013-02-03 DIAGNOSIS — Z01818 Encounter for other preprocedural examination: Secondary | ICD-10-CM | POA: Insufficient documentation

## 2013-02-03 DIAGNOSIS — R262 Difficulty in walking, not elsewhere classified: Secondary | ICD-10-CM | POA: Insufficient documentation

## 2013-02-03 DIAGNOSIS — M5412 Radiculopathy, cervical region: Principal | ICD-10-CM | POA: Insufficient documentation

## 2013-02-03 HISTORY — PX: ANTERIOR CERVICAL DECOMP/DISCECTOMY FUSION: SHX1161

## 2013-02-03 HISTORY — PX: CERVICAL SPINE SURGERY: SHX589

## 2013-02-03 HISTORY — DX: Shortness of breath: R06.02

## 2013-02-03 HISTORY — PX: CERVICAL FUSION: SHX112

## 2013-02-03 SURGERY — ANTERIOR CERVICAL DECOMPRESSION/DISCECTOMY FUSION 1 LEVEL
Anesthesia: General | Site: Spine Cervical | Wound class: Clean

## 2013-02-03 MED ORDER — OXYCODONE HCL 5 MG PO TABS
ORAL_TABLET | ORAL | Status: AC
  Administered 2013-02-03: 5 mg
  Filled 2013-02-03: qty 1

## 2013-02-03 MED ORDER — HYDROMORPHONE HCL PF 1 MG/ML IJ SOLN
INTRAMUSCULAR | Status: AC
  Filled 2013-02-03: qty 1

## 2013-02-03 MED ORDER — MIDAZOLAM HCL 5 MG/5ML IJ SOLN
INTRAMUSCULAR | Status: DC | PRN
  Administered 2013-02-03: 2 mg via INTRAVENOUS

## 2013-02-03 MED ORDER — ACETAMINOPHEN 10 MG/ML IV SOLN
1000.0000 mg | Freq: Four times a day (QID) | INTRAVENOUS | Status: DC
  Administered 2013-02-03 – 2013-02-04 (×3): 1000 mg via INTRAVENOUS
  Filled 2013-02-03 (×4): qty 100

## 2013-02-03 MED ORDER — DEXAMETHASONE SODIUM PHOSPHATE 4 MG/ML IJ SOLN
4.0000 mg | Freq: Four times a day (QID) | INTRAMUSCULAR | Status: DC
  Administered 2013-02-03 (×2): 4 mg via INTRAVENOUS
  Filled 2013-02-03 (×8): qty 1

## 2013-02-03 MED ORDER — CEFAZOLIN SODIUM 1-5 GM-% IV SOLN
1.0000 g | Freq: Three times a day (TID) | INTRAVENOUS | Status: AC
  Administered 2013-02-03 (×2): 1 g via INTRAVENOUS
  Filled 2013-02-03 (×2): qty 50

## 2013-02-03 MED ORDER — BUPIVACAINE-EPINEPHRINE 0.25% -1:200000 IJ SOLN
INTRAMUSCULAR | Status: DC | PRN
  Administered 2013-02-03: 3 mL

## 2013-02-03 MED ORDER — THROMBIN 20000 UNITS EX SOLR
CUTANEOUS | Status: AC
  Filled 2013-02-03: qty 20000

## 2013-02-03 MED ORDER — OXYCODONE HCL 5 MG PO TABS
10.0000 mg | ORAL_TABLET | ORAL | Status: DC | PRN
  Administered 2013-02-03 – 2013-02-04 (×3): 10 mg via ORAL
  Filled 2013-02-03 (×3): qty 2

## 2013-02-03 MED ORDER — MENTHOL 3 MG MT LOZG: 1.0000 | LOZENGE | OROMUCOSAL | Status: DC | PRN

## 2013-02-03 MED ORDER — LACTATED RINGERS IV SOLN
INTRAVENOUS | Status: DC | PRN
  Administered 2013-02-03: 08:00:00 via INTRAVENOUS

## 2013-02-03 MED ORDER — FENTANYL CITRATE 0.05 MG/ML IJ SOLN
INTRAMUSCULAR | Status: DC | PRN
  Administered 2013-02-03 (×2): 50 ug via INTRAVENOUS
  Administered 2013-02-03: 100 ug via INTRAVENOUS
  Administered 2013-02-03: 50 ug via INTRAVENOUS
  Administered 2013-02-03: 100 ug via INTRAVENOUS

## 2013-02-03 MED ORDER — OXYCODONE HCL 5 MG PO TABS: 5.0000 mg | ORAL_TABLET | Freq: Once | ORAL | Status: DC | PRN

## 2013-02-03 MED ORDER — CITALOPRAM HYDROBROMIDE 20 MG PO TABS
20.0000 mg | ORAL_TABLET | Freq: Every day | ORAL | Status: DC
  Administered 2013-02-04: 20 mg via ORAL
  Filled 2013-02-03: qty 1

## 2013-02-03 MED ORDER — NEOSTIGMINE METHYLSULFATE 1 MG/ML IJ SOLN
INTRAMUSCULAR | Status: DC | PRN
  Administered 2013-02-03: 5 mg via INTRAVENOUS

## 2013-02-03 MED ORDER — 0.9 % SODIUM CHLORIDE (POUR BTL) OPTIME
TOPICAL | Status: DC | PRN
  Administered 2013-02-03: 1000 mL

## 2013-02-03 MED ORDER — METHOCARBAMOL 500 MG PO TABS
ORAL_TABLET | ORAL | Status: AC
  Administered 2013-02-03: 500 mg
  Filled 2013-02-03: qty 1

## 2013-02-03 MED ORDER — TERBINAFINE HCL 1 % EX CREA: 1.0000 "application " | TOPICAL_CREAM | Freq: Two times a day (BID) | CUTANEOUS | Status: DC

## 2013-02-03 MED ORDER — DOCUSATE SODIUM 100 MG PO CAPS
100.0000 mg | ORAL_CAPSULE | Freq: Two times a day (BID) | ORAL | Status: DC
  Administered 2013-02-03 – 2013-02-04 (×2): 100 mg via ORAL
  Filled 2013-02-03 (×3): qty 1

## 2013-02-03 MED ORDER — ALBUTEROL SULFATE HFA 108 (90 BASE) MCG/ACT IN AERS
2.0000 | INHALATION_SPRAY | RESPIRATORY_TRACT | Status: DC | PRN
  Filled 2013-02-03: qty 6.7

## 2013-02-03 MED ORDER — METHOCARBAMOL 500 MG PO TABS
500.0000 mg | ORAL_TABLET | Freq: Four times a day (QID) | ORAL | Status: DC | PRN
  Administered 2013-02-03 – 2013-02-04 (×2): 500 mg via ORAL
  Filled 2013-02-03 (×2): qty 1

## 2013-02-03 MED ORDER — ARTIFICIAL TEARS OP OINT
TOPICAL_OINTMENT | OPHTHALMIC | Status: DC | PRN
  Administered 2013-02-03: 1 via OPHTHALMIC

## 2013-02-03 MED ORDER — SODIUM CHLORIDE 0.9 % IJ SOLN
3.0000 mL | Freq: Two times a day (BID) | INTRAMUSCULAR | Status: DC
  Administered 2013-02-03: 3 mL via INTRAVENOUS

## 2013-02-03 MED ORDER — PROMETHAZINE HCL 25 MG/ML IJ SOLN: 6.2500 mg | INTRAMUSCULAR | Status: DC | PRN

## 2013-02-03 MED ORDER — DEXTROSE 5 % IV SOLN: 500.0000 mg | Freq: Four times a day (QID) | INTRAVENOUS | Status: DC | PRN

## 2013-02-03 MED ORDER — HYDROMORPHONE HCL PF 1 MG/ML IJ SOLN
0.2500 mg | INTRAMUSCULAR | Status: DC | PRN
  Administered 2013-02-03 (×4): 0.5 mg via INTRAVENOUS

## 2013-02-03 MED ORDER — HEMOSTATIC AGENTS (NO CHARGE) OPTIME
TOPICAL | Status: DC | PRN
  Administered 2013-02-03: 1 via TOPICAL

## 2013-02-03 MED ORDER — ONDANSETRON HCL 4 MG/2ML IJ SOLN
INTRAMUSCULAR | Status: DC | PRN
  Administered 2013-02-03: 4 mg via INTRAVENOUS

## 2013-02-03 MED ORDER — LACTATED RINGERS IV SOLN
INTRAVENOUS | Status: DC | PRN
  Administered 2013-02-03 (×2): via INTRAVENOUS

## 2013-02-03 MED ORDER — LIDOCAINE HCL (CARDIAC) 20 MG/ML IV SOLN
INTRAVENOUS | Status: DC | PRN
  Administered 2013-02-03: 90 mg via INTRAVENOUS

## 2013-02-03 MED ORDER — PHENOL 1.4 % MT LIQD
1.0000 | OROMUCOSAL | Status: DC | PRN
  Filled 2013-02-03: qty 177

## 2013-02-03 MED ORDER — ONDANSETRON HCL 4 MG/2ML IJ SOLN
4.0000 mg | INTRAMUSCULAR | Status: DC | PRN
  Administered 2013-02-03: 4 mg via INTRAVENOUS
  Filled 2013-02-03: qty 2

## 2013-02-03 MED ORDER — OXYCODONE HCL 5 MG/5ML PO SOLN: 5.0000 mg | Freq: Once | ORAL | Status: DC | PRN

## 2013-02-03 MED ORDER — LACTATED RINGERS IV SOLN
INTRAVENOUS | Status: DC
  Administered 2013-02-03: 12:00:00 via INTRAVENOUS

## 2013-02-03 MED ORDER — PROPOFOL 10 MG/ML IV BOLUS
INTRAVENOUS | Status: DC | PRN
  Administered 2013-02-03: 200 mg via INTRAVENOUS
  Administered 2013-02-03: 100 mg via INTRAVENOUS

## 2013-02-03 MED ORDER — KETOCONAZOLE 2 % EX CREA
1.0000 "application " | TOPICAL_CREAM | Freq: Two times a day (BID) | CUTANEOUS | Status: DC
  Filled 2013-02-03: qty 15

## 2013-02-03 MED ORDER — SODIUM CHLORIDE 0.9 % IV SOLN: 250.0000 mL | INTRAVENOUS | Status: DC

## 2013-02-03 MED ORDER — BUPIVACAINE-EPINEPHRINE PF 0.25-1:200000 % IJ SOLN
INTRAMUSCULAR | Status: AC
  Filled 2013-02-03: qty 30

## 2013-02-03 MED ORDER — ZOLPIDEM TARTRATE 5 MG PO TABS: 5.0000 mg | ORAL_TABLET | Freq: Every evening | ORAL | Status: DC | PRN

## 2013-02-03 MED ORDER — SODIUM CHLORIDE 0.9 % IJ SOLN: 3.0000 mL | INTRAMUSCULAR | Status: DC | PRN

## 2013-02-03 MED ORDER — PHENYLEPHRINE HCL 10 MG/ML IJ SOLN
INTRAMUSCULAR | Status: DC | PRN
  Administered 2013-02-03 (×2): 80 ug via INTRAVENOUS

## 2013-02-03 MED ORDER — MORPHINE SULFATE 2 MG/ML IJ SOLN
1.0000 mg | INTRAMUSCULAR | Status: DC | PRN
  Administered 2013-02-03 (×2): 2 mg via INTRAVENOUS
  Filled 2013-02-03 (×2): qty 1

## 2013-02-03 MED ORDER — ROCURONIUM BROMIDE 100 MG/10ML IV SOLN
INTRAVENOUS | Status: DC | PRN
  Administered 2013-02-03: 20 mg via INTRAVENOUS
  Administered 2013-02-03: 50 mg via INTRAVENOUS

## 2013-02-03 MED ORDER — SUCCINYLCHOLINE CHLORIDE 20 MG/ML IJ SOLN
INTRAMUSCULAR | Status: DC | PRN
  Administered 2013-02-03: 140 mg via INTRAVENOUS

## 2013-02-03 MED ORDER — DEXAMETHASONE 4 MG PO TABS
4.0000 mg | ORAL_TABLET | Freq: Four times a day (QID) | ORAL | Status: DC
  Administered 2013-02-03 – 2013-02-04 (×2): 4 mg via ORAL
  Filled 2013-02-03 (×10): qty 1

## 2013-02-03 MED ORDER — GLYCOPYRROLATE 0.2 MG/ML IJ SOLN
INTRAMUSCULAR | Status: DC | PRN
  Administered 2013-02-03: .8 mg via INTRAVENOUS

## 2013-02-03 MED ORDER — THROMBIN 20000 UNITS EX SOLR
CUTANEOUS | Status: DC | PRN
  Administered 2013-02-03: 09:00:00 via TOPICAL

## 2013-02-03 MED ORDER — FLUTICASONE PROPIONATE 50 MCG/ACT NA SUSP
2.0000 | Freq: Every day | NASAL | Status: DC | PRN
  Filled 2013-02-03: qty 16

## 2013-02-03 SURGICAL SUPPLY — 54 items
BLADE SURG ROTATE 9660 (MISCELLANEOUS) IMPLANT
BUR EGG ELITE 4.0 (BURR) IMPLANT
BUR MATCHSTICK NEURO 3.0 LAGG (BURR) IMPLANT
CANISTER SUCTION 2500CC (MISCELLANEOUS) ×2 IMPLANT
CLOTH BEACON ORANGE TIMEOUT ST (SAFETY) ×2 IMPLANT
CLSR STERI-STRIP ANTIMIC 1/2X4 (GAUZE/BANDAGES/DRESSINGS) ×2 IMPLANT
CORDS BIPOLAR (ELECTRODE) ×2 IMPLANT
COVER SURGICAL LIGHT HANDLE (MISCELLANEOUS) ×4 IMPLANT
CRADLE DONUT ADULT HEAD (MISCELLANEOUS) ×2 IMPLANT
DRAPE C-ARM 42X72 X-RAY (DRAPES) ×2 IMPLANT
DRAPE POUCH INSTRU U-SHP 10X18 (DRAPES) ×2 IMPLANT
DRAPE SURG 17X23 STRL (DRAPES) ×2 IMPLANT
DRAPE U-SHAPE 47X51 STRL (DRAPES) ×2 IMPLANT
DRSG MEPILEX BORDER 4X4 (GAUZE/BANDAGES/DRESSINGS) ×2 IMPLANT
DURAPREP 26ML APPLICATOR (WOUND CARE) ×2 IMPLANT
ELECT COATED BLADE 2.86 ST (ELECTRODE) ×2 IMPLANT
ELECT REM PT RETURN 9FT ADLT (ELECTROSURGICAL) ×2
ELECTRODE REM PT RTRN 9FT ADLT (ELECTROSURGICAL) ×1 IMPLANT
ENDOSKELETON LG TC 6VBR 8MM (Orthopedic Implant) ×1 IMPLANT
GLOVE BIOGEL PI IND STRL 8.5 (GLOVE) ×1 IMPLANT
GLOVE BIOGEL PI INDICATOR 8.5 (GLOVE) ×1
GLOVE ECLIPSE 8.5 STRL (GLOVE) ×2 IMPLANT
GOWN PREVENTION PLUS XXLARGE (GOWN DISPOSABLE) ×2 IMPLANT
GOWN STRL REIN XL XLG (GOWN DISPOSABLE) ×4 IMPLANT
KIT BASIN OR (CUSTOM PROCEDURE TRAY) ×2 IMPLANT
KIT ROOM TURNOVER OR (KITS) ×2 IMPLANT
NDL SPNL 18GX3.5 QUINCKE PK (NEEDLE) ×1 IMPLANT
NEEDLE SPNL 18GX3.5 QUINCKE PK (NEEDLE) ×2 IMPLANT
NS IRRIG 1000ML POUR BTL (IV SOLUTION) ×2 IMPLANT
PACK ORTHO CERVICAL (CUSTOM PROCEDURE TRAY) ×2 IMPLANT
PACK UNIVERSAL I (CUSTOM PROCEDURE TRAY) ×2 IMPLANT
PAD ARMBOARD 7.5X6 YLW CONV (MISCELLANEOUS) ×4 IMPLANT
PATTIES SURGICAL .25X.25 (GAUZE/BANDAGES/DRESSINGS) IMPLANT
PIN DISTRACTION 14 (PIN) ×1 IMPLANT
PIN RETAINER PRODISC 14 MM (PIN) ×1 IMPLANT
PLATE ONE LEVEL SKYLINE 14MM (Plate) ×1 IMPLANT
PUTTY BONE DBX 2.5 MIS (Bone Implant) ×1 IMPLANT
RESTRAINT LIMB HOLDER UNIV (RESTRAINTS) ×2 IMPLANT
SCREW SKYLINE 14MM SD-VA (Screw) ×4 IMPLANT
SPONGE INTESTINAL PEANUT (DISPOSABLE) ×2 IMPLANT
SPONGE SURGIFOAM ABS GEL 100 (HEMOSTASIS) ×2 IMPLANT
SURGIFLO TRUKIT (HEMOSTASIS) IMPLANT
SUT MNCRL AB 3-0 PS2 18 (SUTURE) ×2 IMPLANT
SUT SILK 2 0 (SUTURE)
SUT SILK 2-0 18XBRD TIE 12 (SUTURE) IMPLANT
SUT VIC AB 2-0 CT1 18 (SUTURE) ×2 IMPLANT
SYR BULB IRRIGATION 50ML (SYRINGE) ×2 IMPLANT
SYR CONTROL 10ML LL (SYRINGE) ×2 IMPLANT
TAPE CLOTH 4X10 WHT NS (GAUZE/BANDAGES/DRESSINGS) ×2 IMPLANT
TAPE UMBILICAL COTTON 1/8X30 (MISCELLANEOUS) ×2 IMPLANT
TOWEL OR 17X24 6PK STRL BLUE (TOWEL DISPOSABLE) ×2 IMPLANT
TOWEL OR 17X26 10 PK STRL BLUE (TOWEL DISPOSABLE) ×2 IMPLANT
WATER STERILE IRR 1000ML POUR (IV SOLUTION) ×2 IMPLANT
YANKAUER SUCT BULB TIP NO VENT (SUCTIONS) ×1 IMPLANT

## 2013-02-03 NOTE — Plan of Care (Signed)
Problem: Consults Goal: Diagnosis - Spinal Surgery Cervical Spine Fusion C6-C7     

## 2013-02-03 NOTE — Brief Op Note (Signed)
02/03/2013  9:45 AM  PATIENT:  Chad Knapp  62 y.o. male  PRE-OPERATIVE DIAGNOSIS:  cervical spondylotic radiculopathy  POST-OPERATIVE DIAGNOSIS:  cervical spondylotic radiculopathy  PROCEDURE:  Procedure(s): ACDF C6 - 7     1 LEVEL (N/A)  SURGEON:  Surgeon(s) and Role:    * Venita Lick, MD - Primary  PHYSICIAN ASSISTANT:   ASSISTANTS: none   ANESTHESIA:   general  EBL:  Total I/O In: 1000 [I.V.:1000] Out: -   BLOOD ADMINISTERED:none  DRAINS: none   LOCAL MEDICATIONS USED:  MARCAINE     SPECIMEN:  No Specimen  DISPOSITION OF SPECIMEN:  N/A  COUNTS:  YES  TOURNIQUET:  * No tourniquets in log *  DICTATION: .Other Dictation: Dictation Number [id number]  PLAN OF CARE: Admit for overnight observation  PATIENT DISPOSITION:  PACU - hemodynamically stable.

## 2013-02-03 NOTE — Evaluation (Signed)
Physical Therapy Evaluation Patient Details Name: Chad Knapp MRN: [id number] DOB: 1951-04-02 Today's Date: 02/03/2013 Time: 1207-1232 PT Time Calculation (min): 25 min  PT Assessment / Plan / Recommendation History of Present Illness  Pt is a 62 y/o male admitted s/p ACDF C6-7 on 02/03/2013  Clinical Impression  Pt presents with decreased safety and independence with functional mobility following ACDF of C6-7. Pt with previous injury to L lower leg with residual foot drop, but functional strength grossly in LLE. At time of PT eval, pt required supervision during transfer to EOB, and min guard for all other transfers and ambulation. This patient would benefit from skilled physical therapy services to address acute functional limitations, stair training, gait training with an AD, and functional mobility prior to d/c.     PT Assessment  Patient needs continued PT services    Follow Up Recommendations  No PT follow up    Does the patient have the potential to tolerate intense rehabilitation      Barriers to Discharge        Equipment Recommendations  Rolling walker with 5" wheels (depending on progress; pt has cane at home)    Recommendations for Other Services     Frequency 7X/week    Precautions / Restrictions Precautions Precautions: Cervical Required Braces or Orthoses: Cervical Brace Cervical Brace: Hard collar Restrictions Weight Bearing Restrictions: No   Pertinent Vitals/Pain Pt reports that he has had pain medication and is not in pain at time of PT eval.      Mobility  Bed Mobility Bed Mobility: Supine to Sit;Sitting - Scoot to Edge of Bed Supine to Sit: 5: Supervision;HOB elevated;With rails Sitting - Scoot to Edge of Bed: 5: Supervision;With rail Details for Bed Mobility Assistance: Pt able to perform transfer to EOB with VC's for sequencing and maintaining upright cervical posture. Transfers Transfers: Sit to Stand;Stand to Sit Sit to Stand: 4: Min  guard;From bed;With upper extremity assist Stand to Sit: 4: Min guard;To chair/3-in-1;With armrests Details for Transfer Assistance: VC's for hand placement on seated surface prior to initiating transfers, and cues to maintain proper upper body and cervical posture. Ambulation/Gait Ambulation/Gait Assistance: 4: Min guard Ambulation Distance (Feet): 5 Feet Assistive device: Rolling walker Ambulation/Gait Assistance Details: Pt able to ambulate without an AD, however feels more comfortable with RW right now due to residual effects from injury to L LE. Gait Pattern: Step-through pattern;Decreased stride length;Decreased dorsiflexion - left;Decreased weight shift to right;Decreased weight shift to left Gait velocity: decreased Stairs: No    Exercises     PT Diagnosis: Difficulty walking;Acute pain  PT Problem List: Decreased strength;Decreased activity tolerance;Decreased balance;Decreased mobility;Decreased knowledge of use of DME;Decreased safety awareness;Decreased knowledge of precautions;Pain PT Treatment Interventions:       PT Goals(Current goals can be found in the care plan section) Acute Rehab PT Goals Patient Stated Goal: To return home with family PT Goal Formulation: With patient Time For Goal Achievement: 02/10/13 Potential to Achieve Goals: Good  Visit Information  Last PT Received On: 02/03/13 Assistance Needed: +1 History of Present Illness: Pt is a 62 y/o male admitted s/p ACDF C6-7 on 02/03/2013       Prior Functioning  Home Living Family/patient expects to be discharged to:: Private residence Living Arrangements: Spouse/significant other Available Help at Discharge: Family;Available 24 hours/day Type of Home: House Home Access: Stairs to enter [employer] of Steps: 1 Entrance Stairs-Rails: None Home Layout: One level Home Equipment: Cane - single point Prior Function Level of  Independence: Independent Comments: Previous crush injury of L lower  leg causing foot drop. Pt able to ambulate without an AD with shoes on prior to admission. Communication Communication: No difficulties Dominant Hand: Right    Cognition  Cognition Arousal/Alertness: Awake/alert Behavior During Therapy: WFL for tasks assessed/performed Overall Cognitive Status: Within Functional Limits for tasks assessed    Extremity/Trunk Assessment Upper Extremity Assessment Upper Extremity Assessment: Defer to OT evaluation Lower Extremity Assessment Lower Extremity Assessment: Overall WFL for tasks assessed;LLE deficits/detail LLE Deficits / Details: Previous crush injury causing foot drop   Balance Balance Balance Assessed: Yes Static Sitting Balance Static Sitting - Balance Support: Bilateral upper extremity supported;Feet supported Static Sitting - Level of Assistance: 7: Independent Static Sitting - Comment/# of Minutes: 5  End of Session PT - End of Session Equipment Utilized During Treatment: Gait belt;Cervical collar;Oxygen Activity Tolerance: Patient tolerated treatment well Patient left: in chair;with call bell/phone within reach  GP     Ruthann Cancer 02/03/2013, 1:44 PM  Ruthann Cancer, PT Acute Rehabilitation Services

## 2013-02-03 NOTE — H&P (Signed)
No change in clinical exam H+P Reviewed  

## 2013-02-03 NOTE — Preoperative (Signed)
Beta Blockers   Reason not to administer Beta Blockers:Not Applicable 

## 2013-02-03 NOTE — Anesthesia Procedure Notes (Signed)
Procedure Name: Intubation Date/Time: 02/03/2013 7:40 AM Performed by: Margaree Mackintosh Pre-anesthesia Checklist: Patient identified, Timeout performed, Emergency Drugs available, Suction available and Patient being monitored Patient Re-evaluated:Patient Re-evaluated prior to inductionOxygen Delivery Method: Circle system utilized Preoxygenation: Pre-oxygenation with 100% oxygen Intubation Type: IV induction Ventilation: Two handed mask ventilation required and Oral airway inserted - appropriate to patient size Laryngoscope size: Glidescope. Grade View: Grade I Tube type: Oral Tube size: 7.5 mm Number of attempts: 1 Airway Equipment and Method: Stylet Placement Confirmation: ETT inserted through vocal cords under direct vision,  positive ETCO2 and breath sounds checked- equal and bilateral Secured at: 23 cm Tube secured with: Tape Dental Injury: Teeth and Oropharynx as per pre-operative assessment

## 2013-02-03 NOTE — Anesthesia Postprocedure Evaluation (Signed)
  Anesthesia Post-op Note  Patient: Chad Knapp  Procedure(s) Performed: Procedure(s): ACDF C6 - 7     1 LEVEL (N/A)  Patient Location: PACU  Anesthesia Type:General  Level of Consciousness: awake, alert  and oriented  Airway and Oxygen Therapy: Patient Spontanous Breathing and Patient connected to nasal cannula oxygen  Post-op Pain: mild  Post-op Assessment: Post-op Vital signs reviewed, Patient's Cardiovascular Status Stable, Respiratory Function Stable, Patent Airway and Pain level controlled  Post-op Vital Signs: stable  Complications: No apparent anesthesia complications

## 2013-02-03 NOTE — Anesthesia Preprocedure Evaluation (Addendum)
Anesthesia Evaluation  Patient identified by MRN, date of birth, ID band Patient awake    Reviewed: Allergy & Precautions, H&P , NPO status , Patient's Chart, lab work & pertinent test results  History of Anesthesia Complications Negative for: history of anesthetic complications  Airway Mallampati: II TM Distance: >3 FB Neck ROM: Full    Dental  (+) Dental Advisory Given and Teeth Intact   Pulmonary sleep apnea and Continuous Positive Airway Pressure Ventilation , former smoker,  breath sounds clear to auscultation        Cardiovascular negative cardio ROS  Rhythm:Regular Rate:Normal     Neuro/Psych negative neurological ROS     GI/Hepatic negative GI ROS, Neg liver ROS,   Endo/Other  negative endocrine ROS  Renal/GU negative Renal ROS     Musculoskeletal   Abdominal   Peds  Hematology   Anesthesia Other Findings   Reproductive/Obstetrics                          Anesthesia Physical Anesthesia Plan  ASA: II  Anesthesia Plan: General   Post-op Pain Management:    Induction: Intravenous  Airway Management Planned: Oral ETT  Additional Equipment:   Intra-op Plan:   Post-operative Plan: Extubation in OR  Informed Consent: I have reviewed the patients History and Physical, chart, labs and discussed the procedure including the risks, benefits and alternatives for the proposed anesthesia with the patient or authorized representative who has indicated his/her understanding and acceptance.   Dental advisory given  Plan Discussed with: CRNA and Surgeon  Anesthesia Plan Comments:         Anesthesia Quick Evaluation

## 2013-02-03 NOTE — Transfer of Care (Signed)
Immediate Anesthesia Transfer of Care Note  Patient: Chad Knapp  Procedure(s) Performed: Procedure(s): ACDF C6 - 7     1 LEVEL (N/A)  Patient Location: PACU  Anesthesia Type:General  Level of Consciousness: awake, alert  and oriented  Airway & Oxygen Therapy: Patient Spontanous Breathing and Patient connected to face mask oxygen  Post-op Assessment: Report given to PACU RN, Post -op Vital signs reviewed and stable and Patient moving all extremities X 4  Post vital signs: Reviewed and stable  Complications: No apparent anesthesia complications

## 2013-02-04 ENCOUNTER — Encounter (HOSPITAL_COMMUNITY): Payer: Self-pay | Admitting: Orthopedic Surgery

## 2013-02-04 MED ORDER — DOCUSATE SODIUM 100 MG PO CAPS: 100.0000 mg | ORAL_CAPSULE | Freq: Three times a day (TID) | ORAL | Status: DC | PRN

## 2013-02-04 MED ORDER — POLYETHYLENE GLYCOL 3350 17 GM/SCOOP PO POWD: 17.0000 g | Freq: Every day | ORAL | Status: DC

## 2013-02-04 MED ORDER — ONDANSETRON HCL 4 MG PO TABS: 4.0000 mg | ORAL_TABLET | Freq: Three times a day (TID) | ORAL | Status: DC | PRN

## 2013-02-04 MED ORDER — OXYCODONE-ACETAMINOPHEN 10-325 MG PO TABS: 1.0000 | ORAL_TABLET | ORAL | Status: DC | PRN

## 2013-02-04 MED ORDER — METHOCARBAMOL 500 MG PO TABS: 500.0000 mg | ORAL_TABLET | Freq: Three times a day (TID) | ORAL | Status: DC | PRN

## 2013-02-04 NOTE — Discharge Summary (Signed)
Patient ID: Chad Knapp MRN: [id number] DOB/AGE: May 29, 1951 62 y.o.  Admit date: 02/03/2013 Discharge date: 02/04/2013  Admission Diagnoses:  Active Problems:   * No active hospital problems. *   Discharge Diagnoses:  Active Problems:   * No active hospital problems. *  status post Procedure(s): ACDF C6 - 7     1 LEVEL  Past Medical History  Diagnosis Date  . Arthritis   . Depression   . Anxiety   . Sleep apnea     cpap  . Cancer     basal cell -removed  . Hx of transfusion of packed red blood cells 1974  . Shortness of breath     Surgeries: Procedure(s): ACDF C6 - 7     1 LEVEL on 02/03/2013   Consultants:    Discharged Condition: Improved  Hospital Course: Chad Knapp is an 62 y.o. male who was admitted 02/03/2013 for operative treatment of <principal problem not specified>. Patient failed conservative treatments (please see the history and physical for the specifics) and had severe unremitting pain that affects sleep, daily activities and work/hobbies. After pre-op clearance, the patient was taken to the operating room on 02/03/2013 and underwent  Procedure(s): ACDF C6 - 7     1 LEVEL.    Patient was given perioperative antibiotics: Anti-infectives   Start     Dose/Rate Route Frequency Ordered Stop   02/03/13 1600  ceFAZolin (ANCEF) IVPB 1 g/50 mL premix     1 g 100 mL/hr over 30 Minutes Intravenous Every 8 hours 02/03/13 1142 02/03/13 2349   02/02/13 1424  ceFAZolin (ANCEF) IVPB 2 g/50 mL premix     2 g 100 mL/hr over 30 Minutes Intravenous 30 min pre-op 02/02/13 1424 02/03/13 0751       Patient was given sequential compression devices and early ambulation to prevent DVT.   Patient benefited maximally from hospital stay and there were no complications. At the time of discharge, the patient was urinating/moving their bowels without difficulty, tolerating a regular diet, pain is controlled with oral pain medications and they have been cleared by  PT/OT.   Recent vital signs: Patient Vitals for the past 24 hrs:  BP Temp Temp src Pulse Resp SpO2  02/04/13 0538 138/88 mmHg 98 F (36.7 C) Oral 79 18 98 %  02/04/13 0135 138/81 mmHg 98 F (36.7 C) Oral 66 18 98 %  02/03/13 2056 145/85 mmHg 98.4 F (36.9 C) Oral 83 20 98 %  02/03/13 1400 148/104 mmHg 97.6 F (36.4 C) - 81 20 99 %  02/03/13 1140 136/84 mmHg - - 74 18 96 %  02/03/13 1113 - 98 F (36.7 C) - 73 20 95 %  02/03/13 1107 - - - 73 16 97 %  02/03/13 1102 146/97 mmHg - - - - -  02/03/13 1100 - - - 77 15 96 %  02/03/13 1047 135/91 mmHg - - - - -  02/03/13 1040 137/95 mmHg - - 71 10 96 %  02/03/13 1030 - - - 69 18 99 %  02/03/13 1017 139/93 mmHg - - - - -  02/03/13 1000 140/88 mmHg 97.6 F (36.4 C) - - 20 -  02/03/13 0956 - - - 87 - 91 %     Recent laboratory studies: No results found for this basename: WBC, HGB, HCT, PLT, NA, K, CL, CO2, BUN, CREATININE, GLUCOSE, PT, INR, CALCIUM, 2,  in the last 72 hours   Discharge Medications:  Medication List    STOP taking these medications       naproxen 500 MG tablet  Commonly known as:  NAPROSYN      TAKE these medications       ACIDOPHILUS PO  Take 1 tablet by mouth daily.     albuterol 108 (90 BASE) MCG/ACT inhaler  Commonly known as:  PROVENTIL HFA;VENTOLIN HFA  Inhale 2 puffs into the lungs every 4 (four) hours as needed for wheezing or shortness of breath.     aspirin 81 MG tablet  Take 81 mg by mouth daily.     citalopram 20 MG tablet  Commonly known as:  CELEXA  Take 20 mg by mouth daily.     docusate sodium 100 MG capsule  Commonly known as:  COLACE  Take 1 capsule (100 mg total) by mouth 3 (three) times daily as needed for constipation.     Fish Oil 600 MG Caps  Take 600 mg by mouth daily.     fluticasone 50 MCG/ACT nasal spray  Commonly known as:  FLONASE  Place 2 sprays into the nose daily as needed for rhinitis or allergies.     ketoconazole 2 % cream  Commonly known as:  NIZORAL  Apply  1 application topically 2 (two) times daily.     methocarbamol 500 MG tablet  Commonly known as:  ROBAXIN  Take 1 tablet (500 mg total) by mouth 3 (three) times daily as needed.     multivitamin with minerals Tabs tablet  Take 1 tablet by mouth daily.     ondansetron 4 MG tablet  Commonly known as:  ZOFRAN  Take 1 tablet (4 mg total) by mouth every 8 (eight) hours as needed for nausea.     oxyCODONE-acetaminophen 10-325 MG per tablet  Commonly known as:  PERCOCET  Take 1 tablet by mouth every 4 (four) hours as needed for pain.     polyethylene glycol powder powder  Commonly known as:  GLYCOLAX  Take 17 g by mouth daily.     terbinafine 1 % cream  Commonly known as:  LAMISIL  Apply 1 application topically 2 (two) times daily.        Diagnostic Studies: Dg Cervical Spine 2-3 Views  02/03/2013   *RADIOLOGY REPORT*  Clinical Data: Cervical fusion  DG C-ARM 1-60 MIN,CERVICAL SPINE - 2-3 VIEW  Comparison: 01/27/2013  Findings: Three fluoroscopic intraoperative spot images document changes of anterior instrumented ACDF C6-7.  Alignment is preserved.  IMPRESSION:  ACDF C6-7   Original Report Authenticated By: D. Andria Rhein, MD   Dg Cervical Spine 2 Or 3 Views  02/03/2013   *RADIOLOGY REPORT*  Clinical Data: Post fusion.  CERVICAL SPINE - 2-3 VIEW  Comparison: 01/27/2013 plain film exam.  Findings: Two portable views of the cervical spine submitted for review after surgery.  This reveals C6-7 anterior discectomy with plate and screw is in place with interbody spacer.  The inferior aspect of the fusion is not well visualized on the lateral view secondary to overlying shoulders.  There is a slight tilt of the anterior plate to the left on the frontal view.  On the lateral view, interbody spacer appears contained within the disc space region.  IMPRESSION: C6-7 anterior discectomy with plate and screw is in place with interbody spacer.  The inferior aspect of the fusion is not well visualized on  the lateral view secondary to overlying shoulders. There is a slight tilt of the anterior plate to the left  on the frontal view.  On the lateral view, interbody spacer appears contained within the disc space region.   Original Report Authenticated By: Lacy Duverney, M.D.   Dg Cervical Spine 2 Or 3 Views  01/27/2013   *RADIOLOGY REPORT*  Clinical Data: Preop for cervical fusion.  CERVICAL SPINE - 2-3 VIEW  Comparison: 11/22/2012.  Findings: The cervical vertebral bodies are normally aligned.  No acute bony findings or abnormal prevertebral soft tissue swelling. Mild multilevel disc disease and facet disease but no destructive bony changes.  IMPRESSION: Stable alignment and mild stable degenerative changes. No acute bony findings.   Original Report Authenticated By: Rudie Meyer, M.D.   Dg C-arm 1-60 Min  02/03/2013   *RADIOLOGY REPORT*  Clinical Data: Cervical fusion  DG C-ARM 1-60 MIN,CERVICAL SPINE - 2-3 VIEW  Comparison: 01/27/2013  Findings: Three fluoroscopic intraoperative spot images document changes of anterior instrumented ACDF C6-7.  Alignment is preserved.  IMPRESSION:  ACDF C6-7   Original Report Authenticated By: D. Andria Rhein, MD          Follow-up Information   Follow up with Alvy Beal, MD. Schedule an appointment as soon as possible for a visit in 2 weeks.   Specialty:  Orthopedic Surgery   Contact information:   [address] [place] [address] [phone]       Discharge Plan:  discharge to home  Disposition: stable    Signed: Venita Lick D for Dr. Venita Lick [facility] [phone] 02/04/2013, 7:43 AM

## 2013-02-04 NOTE — Progress Notes (Signed)
    Subjective: Procedure(s) (LRB): ACDF C6 - 7     1 LEVEL (N/A) 1 Day Post-Op  Patient reports pain as 2 on 0-10 scale.  Reports decreased arm pain reports incisional neck pain   Positive void Negative bowel movement Positive flatus Negative chest pain or shortness of breath  Objective: Vital signs in last 24 hours: Temp:  [97.6 F (36.4 C)-98.4 F (36.9 C)] 98 F (36.7 C) (08/15 0538) Pulse Rate:  [66-87] 79 (08/15 0538) Resp:  [10-20] 18 (08/15 0538) BP: (135-148)/(81-104) 138/88 mmHg (08/15 0538) SpO2:  [91 %-99 %] 98 % (08/15 0538)  Intake/Output from previous day: 08/14 0701 - 08/15 0700 In: 2452.1 [P.O.:50; I.V.:2402.1] Out: 2100 [Urine:2050; Blood:50]  Labs: No results found for this basename: WBC, RBC, HCT, PLT,  in the last 72 hours No results found for this basename: NA, K, CL, CO2, BUN, CREATININE, GLUCOSE, CALCIUM,  in the last 72 hours No results found for this basename: LABPT, INR,  in the last 72 hours  Physical Exam: Neurologically intact ABD soft Neurovascular intact Intact pulses distally Incision: dressing C/D/I and no drainage Compartment soft  Assessment/Plan: Patient stable  xrays satisfactory Mobilization with physical therapy Encourage incentive spirometry Continue care  Advance diet Up with therapy D/C IV fluids D/C to home today  Venita Lick, MD [facility] [phone]

## 2013-02-04 NOTE — Evaluation (Signed)
Occupational Therapy Evaluation Patient Details Name: Chad Knapp MRN: [id number] DOB: Nov 14, 1950 Today's Date: 02/04/2013 Time: 4098-1191 OT Time Calculation (min): 14 min  OT Assessment / Plan / Recommendation History of present illness Pt is a 62 y/o male admitted s/p ACDF C6-7 on 02/03/2013   Clinical Impression   Patient evaluated by Occupational Therapy with no further acute OT needs identified. All education has been completed and the patient has no further questions. All education completed. See below for any follow-up Occupational Therapy or equipment needs. OT is signing off. Thank you for this referral.     OT Assessment  Patient does not need any further OT services    Follow Up Recommendations  No OT follow up    Barriers to Discharge      Equipment Recommendations  None recommended by OT    Recommendations for Other Services    Frequency       Precautions / Restrictions Precautions Precautions: Cervical Required Braces or Orthoses: Cervical Brace Cervical Brace: Hard collar Restrictions Weight Bearing Restrictions: No   Pertinent Vitals/Pain     ADL  Eating/Feeding: Modified independent Where Assessed - Eating/Feeding: Chair Grooming: Wash/dry hands;Wash/dry face;Teeth care;Set up Where Assessed - Grooming: Supported sitting;Unsupported standing Upper Body Bathing: Supervision/safety Where Assessed - Upper Body Bathing: Unsupported sitting Lower Body Bathing: Supervision/safety Where Assessed - Lower Body Bathing: Unsupported sit to stand Upper Body Dressing: Supervision/safety Where Assessed - Upper Body Dressing: Unsupported sitting Lower Body Dressing: Supervision/safety Where Assessed - Lower Body Dressing: Unsupported sit to stand Toilet Transfer: Modified independent Toilet Transfer Method: Sit to stand;Stand pivot Toilet Transfer Equipment: Comfort height toilet Toileting - Clothing Manipulation and Hygiene: Supervision/safety Where  Assessed - Toileting Clothing Manipulation and Hygiene: Standing ADL Comments: pt and wife verbalize/demonstrate independence with cervical precautions.  Instructed both in safe technique for LB ADLs, grooming, donning/doffing collar, changing pads.  All questions answered, pt ready for discharge.     OT Diagnosis:    OT Problem List:   OT Treatment Interventions:     OT Goals(Current goals can be found in the care plan section)    Visit Information  Last OT Received On: 02/04/13 Assistance Needed: +1 History of Present Illness: Pt is a 63 y/o male admitted s/p ACDF C6-7 on 02/03/2013       Prior Functioning     Home Living Family/patient expects to be discharged to:: Private residence Living Arrangements: Spouse/significant other Available Help at Discharge: Family;Available 24 hours/day Type of Home: House Home Access: Stairs to enter [employer] of Steps: 1 Entrance Stairs-Rails: None Home Layout: One level Home Equipment: Cane - single point Prior Function Level of Independence: Independent Comments: Previous crush injury of L lower leg causing foot drop. Pt able to ambulate without an AD with shoes on prior to admission. Communication Communication: No difficulties Dominant Hand: Right         Vision/Perception     Cognition  Cognition Arousal/Alertness: Awake/alert Behavior During Therapy: WFL for tasks assessed/performed Overall Cognitive Status: Within Functional Limits for tasks assessed    Extremity/Trunk Assessment Upper Extremity Assessment Upper Extremity Assessment: Overall WFL for tasks assessed (within confines of precautions) Lower Extremity Assessment Lower Extremity Assessment: Defer to PT evaluation     Mobility       Exercise     Balance     End of Session OT - End of Session Equipment Utilized During Treatment: Cervical collar Activity Tolerance: Patient tolerated treatment well Patient left: in chair;with call  bell/phone within reach;with family/visitor present  GO Functional Limitation: Self care Self Care Current Status [phone]): At least 1 percent but less than 20 percent impaired, limited or restricted Self Care Goal Status ([id number]): At least 1 percent but less than 20 percent impaired, limited or restricted Self Care Discharge Status [phone]): At least 1 percent but less than 20 percent impaired, limited or restricted   [name] M 02/04/2013, 3:00 PM

## 2013-02-04 NOTE — Progress Notes (Signed)
   CARE MANAGEMENT NOTE 02/04/2013  Patient:  Chad Knapp, Chad Knapp   Account Number:  [id number]  Date Initiated:  02/04/2013  Documentation initiated by:  [facility] [address] [facility]  Subjective/Objective Assessment:   Anterior cervical diskectomy and fusion C6-7     Action/Plan:   HH, no HH PT recommended.   Anticipated DC Date:  02/04/2013   Anticipated DC Plan:  HOME/SELF CARE      DC Planning Services  CM consult      Choice offered to / List presented to:             Status of service:  Completed, signed off Medicare Important Message given?   (If response is "NO", the following Medicare IM given date fields will be blank) Date Medicare IM given:   Date Additional Medicare IM given:    Discharge Disposition:  HOME/SELF CARE  Per UR Regulation:  Reviewed for med. necessity/level of care/duration of stay  If discussed at Long Length of Stay Meetings, dates discussed:    Comments:  02/04/2013 0945 NCM spoke to pt and no DME is needed. Wife at home to assist with his care. Isidoro Donning RN CCM Case Mgmt phone [phone]

## 2013-02-04 NOTE — Progress Notes (Signed)
PT evaluation addendum Late entry for 02/11/2013   11-Feb-2013 11/08/1207  PT G-Codes **NOT FOR INPATIENT CLASS**  Functional Assessment Tool Used clinical judgement/chart review  Functional Limitation Mobility: Walking and moving around  Mobility: Walking and Moving Around Current Status [phone]) CI  Mobility: Walking and Moving Around Goal Status [phone]) CI  02/04/2013 Corlis Hove, PT [phone]

## 2013-02-04 NOTE — Progress Notes (Signed)
Patient d/c to home this am with wife.  IV's both removed.  Prescriptions and d/c instructions given and reviewed.

## 2013-02-04 NOTE — Progress Notes (Signed)
Utilization review complete. Glessie Eustice RN CCM Case Mgmt phone [phone] 

## 2013-02-04 NOTE — Progress Notes (Signed)
SW received a consult for possible placement. PT  At this time is recommending no follow up. Clinical Social Worker will sign off for now as social work intervention is no longer needed. Please consult [place] again if new need arises.   Sabino Niemann, MSW [phone]

## 2013-02-04 NOTE — Progress Notes (Signed)
Physical Therapy Treatment Patient Details Name: Chad Knapp MRN: [id number] DOB: 08/15/1950 Today's Date: 02/04/2013 Time: 5621-3086 PT Time Calculation (min): 14 min  PT Assessment / Plan / Recommendation  History of Present Illness Pt is a 62 y/o male admitted s/p ACDF C6-7 on 02/03/2013   PT Comments   Patient has met all PT goals. Plans to DC today  Follow Up Recommendations  No PT follow up     Does the patient have the potential to tolerate intense rehabilitation     Barriers to Discharge        Equipment Recommendations  Rolling walker with 5" wheels    Recommendations for Other Services    Frequency 7X/week   Progress towards PT Goals Progress towards PT goals: Progressing toward goals  Plan Current plan remains appropriate    Precautions / Restrictions Precautions Precautions: Cervical Required Braces or Orthoses: Cervical Brace Cervical Brace: Hard collar   Pertinent Vitals/Pain no apparent distress     Mobility  Bed Mobility Supine to Sit: 7: Independent Sitting - Scoot to Edge of Bed: 7: Independent Transfers Sit to Stand: 7: Independent Stand to Sit: 7: Independent Ambulation/Gait Ambulation/Gait Assistance: 6: Modified independent (Device/Increase time) Ambulation Distance (Feet): 600 Feet Assistive device: None Gait Pattern: Within Functional Limits Stairs: Yes Stairs Assistance: 6: Modified independent (Device/Increase time) Stair Management Technique: Forwards;Alternating pattern;No rails Number of Stairs: 5    Exercises     PT Diagnosis:    PT Problem List:   PT Treatment Interventions:     PT Goals (current goals can now be found in the care plan section)    Visit Information  Last PT Received On: 02/04/13 Assistance Needed: +1 History of Present Illness: Pt is a 62 y/o male admitted s/p ACDF C6-7 on 02/03/2013    Subjective Data      Cognition  Cognition Arousal/Alertness: Awake/alert Behavior During Therapy: WFL for  tasks assessed/performed Overall Cognitive Status: Within Functional Limits for tasks assessed    Balance     End of Session PT - End of Session Equipment Utilized During Treatment: Cervical collar Activity Tolerance: Patient tolerated treatment well Patient left: in chair;with call bell/phone within reach   GP     Fredrich Birks 02/04/2013, 8:47 AM 02/04/2013 Fredrich Birks PTA [phone] pager [phone] office

## 2013-02-04 NOTE — Op Note (Signed)
NAME:  QUINTAVIS, BRANDS NO.:  [id number]  MEDICAL RECORD NO.:  [id number]  LOCATION:  [id number]                        FACILITY:  [facility]  PHYSICIAN:  Alvy Beal, MD    DATE OF BIRTH:  1950-07-26  DATE OF PROCEDURE:  02/03/2013 DATE OF DISCHARGE:                              OPERATIVE REPORT   PREOPERATIVE DIAGNOSIS:  Cervical spondylitic radiculopathy, left side C7.  POSTOPERATIVE DIAGNOSIS:  Cervical spondylitic radiculopathy, left side C7.  OPERATIVE PROCEDURE:  Anterior cervical diskectomy and fusion C6-7.  INSTRUMENTATION SYSTEM USED:  Titan 8 large lordotic intervertebral spacer packed with DBX mix with a 14 mm anterior cervical SKYLINE DePuy plate fixed with 14 mm screws.  COMPLICATIONS:  None.  CONDITION:  Stable.  INTRAOPERATIVE FINDINGS:  Disk fragment removed from a left posterolateral corner with resection of the osteophytes in the uncovertebral joint as well.  HISTORY:  This is a very pleasant gentleman, who has been complaining of severe neck and left arm pain for sometime.  Clinical and radiographic analysis confirmed the diagnosis of C7 radiculopathy from a C6-7 disk or disk osteophyte.  The patient had release of symptoms with isolated left C7 selective nerve root block.  As a result of the positive success over the long-term conservative management, we elected to proceed with surgery.  All appropriate risks, benefits, and alternatives to surgery were discussed with the patient and consent was obtained.  OPERATIVE NOTE:  The patient was brought to the operating room, placed supine on the operating table.  After successful induction of general anesthesia and endotracheal intubation, TEDs, and SCDs were applied. Towels were placed between the shoulder blades and pulling restraints were placed at the wrist.  The anterior cervical spine was then prepped and draped in standard fashion.  Time-out was done to confirm the patient, procedure,  and all other pertinent important data.  Once this was done, we identified the C6-7 disk space with x-ray and then infiltrated the proposed transverse incision site.  A standard left transverse Clementeen Graham approach was then undertaken.  Incision was made.  Sharp dissection was carried out down through the platysma and then sharply dissected along the medial border of sternocleidomastoid through the deep cervical and prevertebral fascia.  I then swept the omohyoid to the right along with the trachea and esophagus, and identified and protected the carotid sheath laterally with my finger.  I then placed a needle into the C6-7 disk space, took an x-ray, and confirmed I was at the appropriate level.  I then mobilized the longus coli muscles from the midbody of C6 to the midbody of C7 using bipolar electrocautery.  I then placed self- retaining retractors and deflated the endotracheal cuff, expanded the retractors to the appropriate width and then reinflated the endotracheal cuff.  I then removed the anterior osteophytes from the body of C6-7 and then performed an annulotomy.  Using pituitary rongeurs, curettes, and Kerrison rongeurs, I removed all the disk material.  I then placed distraction pins into the bodies of C6 and C7, distracted the intervertebral space and then maintained the distraction pins.  This allowed me to work further posteriorly.  I released the posterior anulus, and then using  a 1 mm Kerrison resected the posterior osteophytic ridge on the inferior aspect of the C6-7 vertebral bodies. This allowed me to get underneath the uncovertebral joints and resect the osteophytes from here as well.  I then used a small nerve hook to develop a plane underneath the posterior longitudinal ligament.  I then used a 1 mm Kerrison to resect this.  I then mobilized out a disk fragment on the left side with my nerve hook.  At this point, I was satisfied with my decompression.  I then  rasped the endplates to have bleeding subchondral bone.  I trialed with various implants and elected to use the 8 large end plate.  This was obtained, packed with DBX mix, and malleted to the appropriate depth.  The anterior cervical plate was then affixed with self-drilling screws into the bodies of C6 and C7. Once the screws were all tightened down, they all had good purchase.  I finally torqued them with the locking device.  At this point, I then removed all the retracting devices, irrigated copiously with normal saline, obtained hemostasis using bipolar electrocautery.  I then checked to ensure the esophagus did not become entrapped beneath the plate and when I confirmed this, I returned it to the midline position along by the trachea.  I then closed the platysma with interrupted 2-0 Vicryl sutures, and 3-0 Monocryl for the skin.  Steri-Strips and dry dressing, and Aspen collar were applied.  The patient was ultimately extubated and transferred to PACU without incident.  At the end of the case, all needle and sponge counts were correct.  Final x-rays were satisfactory.     Alvy Beal, MD     DDB/MEDQ  D:  02/03/2013  T:  02/03/2013  Job:  [id number]

## 2013-04-28 ENCOUNTER — Other Ambulatory Visit: Payer: Self-pay

## 2014-04-07 ENCOUNTER — Other Ambulatory Visit: Payer: Self-pay

## 2014-09-08 ENCOUNTER — Ambulatory Visit (INDEPENDENT_AMBULATORY_CARE_PROVIDER_SITE_OTHER): Payer: Federal, State, Local not specified - PPO

## 2014-09-08 ENCOUNTER — Ambulatory Visit (INDEPENDENT_AMBULATORY_CARE_PROVIDER_SITE_OTHER): Payer: Federal, State, Local not specified - PPO | Admitting: Family Medicine

## 2014-09-08 ENCOUNTER — Encounter: Payer: Self-pay | Admitting: Family Medicine

## 2014-09-08 VITALS — BP 125/84 | HR 78 | Temp 97.9°F | Resp 16 | Ht 74.0 in | Wt 264.8 lb

## 2014-09-08 DIAGNOSIS — Z1329 Encounter for screening for other suspected endocrine disorder: Secondary | ICD-10-CM

## 2014-09-08 DIAGNOSIS — G4733 Obstructive sleep apnea (adult) (pediatric): Secondary | ICD-10-CM | POA: Diagnosis not present

## 2014-09-08 DIAGNOSIS — Z125 Encounter for screening for malignant neoplasm of prostate: Secondary | ICD-10-CM

## 2014-09-08 DIAGNOSIS — M542 Cervicalgia: Secondary | ICD-10-CM

## 2014-09-08 DIAGNOSIS — Z136 Encounter for screening for cardiovascular disorders: Secondary | ICD-10-CM

## 2014-09-08 DIAGNOSIS — Z23 Encounter for immunization: Secondary | ICD-10-CM

## 2014-09-08 DIAGNOSIS — Z113 Encounter for screening for infections with a predominantly sexual mode of transmission: Secondary | ICD-10-CM

## 2014-09-08 DIAGNOSIS — M5440 Lumbago with sciatica, unspecified side: Secondary | ICD-10-CM

## 2014-09-08 DIAGNOSIS — B351 Tinea unguium: Secondary | ICD-10-CM | POA: Diagnosis not present

## 2014-09-08 DIAGNOSIS — Z1383 Encounter for screening for respiratory disorder NEC: Secondary | ICD-10-CM | POA: Diagnosis not present

## 2014-09-08 DIAGNOSIS — Z0000 Encounter for general adult medical examination without abnormal findings: Secondary | ICD-10-CM | POA: Diagnosis not present

## 2014-09-08 DIAGNOSIS — Z130 Encounter for screening for diseases of the blood and blood-forming organs and certain disorders involving the immune mechanism: Secondary | ICD-10-CM | POA: Diagnosis not present

## 2014-09-08 DIAGNOSIS — Z1389 Encounter for screening for other disorder: Secondary | ICD-10-CM

## 2014-09-08 LAB — CBC WITH DIFFERENTIAL/PLATELET
BASOS ABS: 0 10*3/uL (ref 0.0–0.1)
BASOS PCT: 0 % (ref 0–1)
Eosinophils Absolute: 0.4 10*3/uL (ref 0.0–0.7)
Eosinophils Relative: 6 % — ABNORMAL HIGH (ref 0–5)
HEMATOCRIT: 46 % (ref 39.0–52.0)
Hemoglobin: 16.3 g/dL (ref 13.0–17.0)
LYMPHS PCT: 29 % (ref 12–46)
Lymphs Abs: 1.9 10*3/uL (ref 0.7–4.0)
MCH: 31.7 pg (ref 26.0–34.0)
MCHC: 35.4 g/dL (ref 30.0–36.0)
MCV: 89.5 fL (ref 78.0–100.0)
MONO ABS: 0.5 10*3/uL (ref 0.1–1.0)
MONOS PCT: 8 % (ref 3–12)
MPV: 8.9 fL (ref 8.6–12.4)
NEUTROS ABS: 3.8 10*3/uL (ref 1.7–7.7)
NEUTROS PCT: 57 % (ref 43–77)
Platelets: 151 10*3/uL (ref 150–400)
RBC: 5.14 MIL/uL (ref 4.22–5.81)
RDW: 13.5 % (ref 11.5–15.5)
WBC: 6.6 10*3/uL (ref 4.0–10.5)

## 2014-09-08 NOTE — Patient Instructions (Signed)
Cardiac Diet This diet can help prevent heart disease and stroke. Many factors influence your heart health, including eating and exercise habits. Coronary risk rises a lot with abnormal blood fat (lipid) levels. Cardiac meal planning includes limiting unhealthy fats, increasing healthy fats, and making other small dietary changes. General guidelines are as follows:  Adjust calorie intake to reach and maintain desirable body weight.  Limit total fat intake to less than 30% of total calories. Saturated fat should be less than 7% of calories.  Saturated fats are found in animal products and in some vegetable products. Saturated vegetable fats are found in coconut oil, cocoa butter, palm oil, and palm kernel oil. Read labels carefully to avoid these products as much as possible. Use butter in moderation. Choose tub margarines and oils that have 2 grams of fat or less. Good cooking oils are canola and olive oils.  Practice low-fat cooking techniques. Do not fry food. Instead, broil, bake, boil, steam, grill, roast on a rack, stir-fry, or microwave it. Other fat reducing suggestions include:  Remove the skin from poultry.  Remove all visible fat from meats.  Skim the fat off stews, soups, and gravies before serving them.  Steam vegetables in water or broth instead of sauting them in fat.  Avoid foods with trans fat (or hydrogenated oils), such as commercially fried foods and commercially baked goods. Commercial shortening and deep-frying fats will contain trans fat.  Increase intake of fruits, vegetables, whole grains, and legumes to replace foods high in fat.  Increase consumption of nuts, legumes, and seeds to at least 4 servings weekly. One serving of a legume equals  cup, and 1 serving of nuts or seeds equals  cup.  Choose whole grains more often. Have 3 servings per day (a serving is 1 ounce [oz]).  Eat 4 to 5 servings of vegetables per day. A serving of vegetables is 1 cup of raw leafy  vegetables;  cup of raw or cooked cut-up vegetables;  cup of vegetable juice.  Eat 4 to 5 servings of fruit per day. A serving of fruit is 1 medium whole fruit;  cup of dried fruit;  cup of fresh, frozen, or canned fruit;  cup of 100% fruit juice.  Increase your intake of dietary fiber to 20 to 30 grams per day. Insoluble fiber may help lower your risk of heart disease and may help curb your appetite.  Soluble fiber binds cholesterol to be removed from the blood. Foods high in soluble fiber are dried beans, citrus fruits, oats, apples, bananas, broccoli, Brussels sprouts, and eggplant.  Try to include foods fortified with plant sterols or stanols, such as yogurt, breads, juices, or margarines. Choose several fortified foods to achieve a daily intake of 2 to 3 grams of plant sterols or stanols.  Foods with omega-3 fats can help reduce your risk of heart disease. Aim to have a 3.5 oz portion of fatty fish twice per week, such as salmon, mackerel, albacore tuna, sardines, lake trout, or herring. If you wish to take a fish oil supplement, choose one that contains 1 gram of both DHA and EPA.  Limit processed meats to 2 servings (3 oz portion) weekly.  Limit the sodium in your diet to 1500 milligrams (mg) per day. If you have high blood pressure, talk to a registered dietitian about a DASH (Dietary Approaches to Stop Hypertension) eating plan.  Limit sweets and beverages with added sugar, such as soda, to no more than 5 servings per week. One  serving is:   1 tablespoon sugar.  1 tablespoon jelly or jam.   cup sorbet.  1 cup lemonade.   cup regular soda. CHOOSING FOODS Starches  Allowed: Breads: All kinds (wheat, rye, raisin, white, oatmeal, [place], [place], and English muffin bread). Low-fat rolls: English muffins, frankfurter and hamburger buns, bagels, pita bread, tortillas (not fried). Pancakes, waffles, biscuits, and muffins made with recommended oil.  Avoid: Products made with  saturated or trans fats, oils, or whole milk products. Butter rolls, cheese breads, croissants. Commercial doughnuts, muffins, sweet rolls, biscuits, waffles, pancakes, store-bought mixes. Crackers  Allowed: Low-fat crackers and snacks: Animal, graham, rye, saltine (with recommended oil, no lard), oyster, and matzo crackers. Bread sticks, melba toast, rusks, flatbread, pretzels, and light popcorn.  Avoid: High-fat crackers: cheese crackers, butter crackers, and those made with coconut, palm oil, or trans fat (hydrogenated oils). Buttered popcorn. Cereals  Allowed: Hot or cold whole-grain cereals.  Avoid: Cereals containing coconut, hydrogenated vegetable fat, or animal fat. Potatoes / Pasta / Rice  Allowed: All kinds of potatoes, rice, and pasta (such as macaroni, spaghetti, and noodles).  Avoid: Pasta or rice prepared with cream sauce or high-fat cheese. Chow mein noodles, [place] fries. Vegetables  Allowed: All vegetables and vegetable juices.  Avoid: Fried vegetables. Vegetables in cream, butter, or high-fat cheese sauces. Limit coconut. Fruit in cream or custard. Protein  Allowed: Limit your intake of meat, seafood, and poultry to no more than 6 oz (cooked weight) per day. All lean, well-trimmed beef, veal, pork, and lamb. All chicken and [place] without skin. All fish and shellfish. Wild game: wild duck, rabbit, pheasant, and venison. Egg whites or low-cholesterol egg substitutes may be used as desired. Meatless dishes: recipes with dried beans, peas, lentils, and tofu (soybean curd). Seeds and nuts: all seeds and most nuts.  Avoid: Prime grade and other heavily marbled and fatty meats, such as short ribs, spare ribs, rib eye roast or steak, frankfurters, sausage, bacon, and high-fat luncheon meats, mutton. Caviar. Commercially fried fish. Domestic duck, goose, venison sausage. Organ meats: liver, gizzard, heart, chitterlings, brains, kidney, sweetbreads. Dairy  Allowed: Low-fat  cheeses: nonfat or low-fat cottage cheese (1% or 2% fat), cheeses made with part skim milk, such as mozzarella, farmers, string, or ricotta. (Cheeses should be labeled no more than 2 to 6 grams fat per oz.). Skim (or 1%) milk: liquid, powdered, or evaporated. Buttermilk made with low-fat milk. Drinks made with skim or low-fat milk or cocoa. Chocolate milk or cocoa made with skim or low-fat (1%) milk. Nonfat or low-fat yogurt.  Avoid: Whole milk cheeses, including colby, cheddar, [place], Monterey Jack, [place], [place], [place], American, Swiss, and blue. Creamed cottage cheese, cream cheese. Whole milk and whole milk products, including buttermilk or yogurt made from whole milk, drinks made from whole milk. Condensed milk, evaporated whole milk, and 2% milk. Soups and Combination Foods  Allowed: Low-fat low-sodium soups: broth, dehydrated soups, homemade broth, soups with the fat removed, homemade cream soups made with skim or low-fat milk. Low-fat spaghetti, lasagna, chili, and Spanish rice if low-fat ingredients and low-fat cooking techniques are used.  Avoid: Cream soups made with whole milk, cream, or high-fat cheese. All other soups. Desserts and Sweets  Allowed: Sherbet, fruit ices, gelatins, meringues, and angel food cake. Homemade desserts with recommended fats, oils, and milk products. Jam, jelly, honey, marmalade, sugars, and syrups. Pure sugar candy, such as gum drops, hard candy, jelly beans, marshmallows, mints, and small amounts of dark chocolate.  Avoid: Commercially prepared  cakes, pies, cookies, frosting, pudding, or mixes for these products. Desserts containing whole milk products, chocolate, coconut, lard, palm oil, or palm kernel oil. Ice cream or ice cream drinks. Candy that contains chocolate, coconut, butter, hydrogenated fat, or unknown ingredients. Buttered syrups. Fats and Oils  Allowed: Vegetable oils: safflower, sunflower, corn, soybean, cottonseed, sesame, canola, olive,  or peanut. Non-hydrogenated margarines. Salad dressing or mayonnaise: homemade or commercial, made with a recommended oil. Low or nonfat salad dressing or mayonnaise.  Limit added fats and oils to 6 to 8 tsp per day (includes fats used in cooking, baking, salads, and spreads on bread). Remember to count the "hidden fats" in foods.  Avoid: Solid fats and shortenings: butter, lard, salt pork, bacon drippings. Gravy containing meat fat, shortening, or suet. Cocoa butter, coconut. Coconut oil, palm oil, palm kernel oil, or hydrogenated oils: these ingredients are often used in bakery products, nondairy creamers, whipped toppings, candy, and commercially fried foods. Read labels carefully. Salad dressings made of unknown oils, sour cream, or cheese, such as blue cheese and Roquefort. Cream, all kinds: half-and-half, light, heavy, or whipping. Sour cream or cream cheese (even if "light" or low-fat). Nondairy cream substitutes: coffee creamers and sour cream substitutes made with palm, palm kernel, hydrogenated oils, or coconut oil. Beverages  Allowed: Coffee (regular or decaffeinated), tea. Diet carbonated beverages, mineral water. Alcohol: Check with your caregiver. Moderation is recommended.  Avoid: Whole milk, regular sodas, and juice drinks with added sugar. Condiments  Allowed: All seasonings and condiments. Cocoa powder. "Cream" sauces made with recommended ingredients.  Avoid: Carob powder made with hydrogenated fats. SAMPLE MENU Breakfast   cup orange juice   cup oatmeal  1 slice toast  1 tsp margarine  1 cup skim milk Lunch  [place] sandwich with 2 oz [place], 2 slices bread  Lettuce and tomato slices  Fresh fruit  Carrot sticks  Coffee or tea Snack  Fresh fruit or low-fat crackers Dinner  3 oz lean ground beef  1 baked potato  1 tsp margarine   cup asparagus  Lettuce salad  1 tbs non-creamy dressing   cup peach slices  1 cup skim milk Document Released:  03/18/2008 Document Revised: 12/09/2011 Document Reviewed: 08/09/2013 [employer] Patient Information 2015 [place], [place]. This information is not intended to replace advice given to you by your health care provider. Make sure you discuss any questions you have with your health care provider.   Health Maintenance A healthy lifestyle and preventative care can promote health and wellness.  Maintain regular health, dental, and eye exams.  Eat a healthy diet. Foods like vegetables, fruits, whole grains, low-fat dairy products, and lean protein foods contain the nutrients you need and are low in calories. Decrease your intake of foods high in solid fats, added sugars, and salt. Get information about a proper diet from your health care provider, if necessary.  Regular physical exercise is one of the most important things you can do for your health. Most adults should get at least 150 minutes of moderate-intensity exercise (any activity that increases your heart rate and causes you to sweat) each week. In addition, most adults need muscle-strengthening exercises on 2 or more days a week.   Maintain a healthy weight. The body mass index (BMI) is a screening tool to identify possible weight problems. It provides an estimate of body fat based on height and weight. Your health care provider can find your BMI and can help you achieve or maintain a healthy weight. For males  20 years and older:  A BMI below 18.5 is considered underweight.  A BMI of 18.5 to 24.9 is normal.  A BMI of 25 to 29.9 is considered overweight.  A BMI of 30 and above is considered obese.  Maintain normal blood lipids and cholesterol by exercising and minimizing your intake of saturated fat. Eat a balanced diet with plenty of fruits and vegetables. Blood tests for lipids and cholesterol should begin at age 48 and be repeated every 5 years. If your lipid or cholesterol levels are high, you are over age 60, or you are at high risk for  heart disease, you may need your cholesterol levels checked more frequently.Ongoing high lipid and cholesterol levels should be treated with medicines if diet and exercise are not working.  If you smoke, find out from your health care provider how to quit. If you do not use tobacco, do not start.  Lung cancer screening is recommended for adults aged 92-80 years who are at high risk for developing lung cancer because of a history of smoking. A yearly low-dose CT scan of the lungs is recommended for people who have at least a 30-pack-year history of smoking and are current smokers or have quit within the past 15 years. A pack year of smoking is smoking an average of 1 pack of cigarettes a day for 1 year (for example, a 30-pack-year history of smoking could mean smoking 1 pack a day for 30 years or 2 packs a day for 15 years). Yearly screening should continue until the smoker has stopped smoking for at least 15 years. Yearly screening should be stopped for people who develop a health problem that would prevent them from having lung cancer treatment.  If you choose to drink alcohol, do not have more than 2 drinks per day. One drink is considered to be 12 oz (360 mL) of beer, 5 oz (150 mL) of wine, or 1.5 oz (45 mL) of liquor.  Avoid the use of street drugs. Do not share needles with anyone. Ask for help if you need support or instructions about stopping the use of drugs.  High blood pressure causes heart disease and increases the risk of stroke. Blood pressure should be checked at least every 1-2 years. Ongoing high blood pressure should be treated with medicines if weight loss and exercise are not effective.  If you are 10-38 years old, ask your health care provider if you should take aspirin to prevent heart disease.  Diabetes screening involves taking a blood sample to check your fasting blood sugar level. This should be done once every 3 years after age 80 if you are at a normal weight and without  risk factors for diabetes. Testing should be considered at a younger age or be carried out more frequently if you are overweight and have at least 1 risk factor for diabetes.  Colorectal cancer can be detected and often prevented. Most routine colorectal cancer screening begins at the age of 57 and continues through age 8. However, your health care provider may recommend screening at an earlier age if you have risk factors for colon cancer. On a yearly basis, your health care provider may provide home test kits to check for hidden blood in the stool. A small camera at the end of a tube may be used to directly examine the colon (sigmoidoscopy or colonoscopy) to detect the earliest forms of colorectal cancer. Talk to your health care provider about this at age 57 when routine screening  begins. A direct exam of the colon should be repeated every 5-10 years through age 34, unless early forms of precancerous polyps or small growths are found.  People who are at an increased risk for hepatitis B should be screened for this virus. You are considered at high risk for hepatitis B if:  You were born in a country where hepatitis B occurs often. Talk with your health care provider about which countries are considered high risk.  Your parents were born in a high-risk country and you have not received a shot to protect against hepatitis B (hepatitis B vaccine).  You have HIV or AIDS.  You use needles to inject street drugs.  You live with, or have sex with, someone who has hepatitis B.  You are a man who has sex with other men (MSM).  You get hemodialysis treatment.  You take certain medicines for conditions like cancer, organ transplantation, and autoimmune conditions.  Hepatitis C blood testing is recommended for all people born from [birth year] through 1966 and any individual with known risk factors for hepatitis C.  Healthy men should no longer receive prostate-specific antigen (PSA) blood tests as part of  routine cancer screening. Talk to your health care provider about prostate cancer screening.  Testicular cancer screening is not recommended for adolescents or adult males who have no symptoms. Screening includes self-exam, a health care provider exam, and other screening tests. Consult with your health care provider about any symptoms you have or any concerns you have about testicular cancer.  Practice safe sex. Use condoms and avoid high-risk sexual practices to reduce the spread of sexually transmitted infections (STIs).  You should be screened for STIs, including gonorrhea and chlamydia if:  You are sexually active and are younger than 24 years.  You are older than 24 years, and your health care provider tells you that you are at risk for this type of infection.  Your sexual activity has changed since you were last screened, and you are at an increased risk for chlamydia or gonorrhea. Ask your health care provider if you are at risk.  If you are at risk of being infected with HIV, it is recommended that you take a prescription medicine daily to prevent HIV infection. This is called pre-exposure prophylaxis (PrEP). You are considered at risk if:  You are a man who has sex with other men (MSM).  You are a heterosexual man who is sexually active with multiple partners.  You take drugs by injection.  You are sexually active with a partner who has HIV.  Talk with your health care provider about whether you are at high risk of being infected with HIV. If you choose to begin PrEP, you should first be tested for HIV. You should then be tested every 3 months for as long as you are taking PrEP.  Use sunscreen. Apply sunscreen liberally and repeatedly throughout the day. You should seek shade when your shadow is shorter than you. Protect yourself by wearing long sleeves, pants, a wide-brimmed hat, and sunglasses year round whenever you are outdoors.  Tell your health care provider of new moles  or changes in moles, especially if there is a change in shape or color. Also, tell your health care provider if a mole is larger than the size of a pencil eraser.  A one-time screening for abdominal aortic aneurysm (AAA) and surgical repair of large AAAs by ultrasound is recommended for men aged 9-75 years who are current or former smokers.  Stay current with your vaccines (immunizations). Document Released: 12/06/2007 Document Revised: 06/14/2013 Document Reviewed: 11/04/2010 [facility] Patient Information 2015 [place], [place]. This information is not intended to replace advice given to you by your health care provider. Make sure you discuss any questions you have with your health care provider.   Exercise to Stay Healthy Exercise helps you become and stay healthy. EXERCISE IDEAS AND TIPS Choose exercises that:  You enjoy.  Fit into your day. You do not need to exercise really hard to be healthy. You can do exercises at a slow or medium level and stay healthy. You can:  Stretch before and after working out.  Try yoga, Pilates, or tai chi.  Lift weights.  Walk fast, swim, jog, run, climb stairs, bicycle, dance, or rollerskate.  Take aerobic classes. Exercises that burn about 150 calories:  Running 1  miles in 15 minutes.  Playing volleyball for 45 to 60 minutes.  Washing and waxing a car for 45 to 60 minutes.  Playing touch football for 45 minutes.  Walking 1  miles in 35 minutes.  Pushing a stroller 1  miles in 30 minutes.  Playing basketball for 30 minutes.  Raking leaves for 30 minutes.  Bicycling 5 miles in 30 minutes.  Walking 2 miles in 30 minutes.  Dancing for 30 minutes.  Shoveling snow for 15 minutes.  Swimming laps for 20 minutes.  Walking up stairs for 15 minutes.  Bicycling 4 miles in 15 minutes.  Gardening for 30 to 45 minutes.  Jumping rope for 15 minutes.  Washing windows or floors for 45 to 60 minutes. Document Released: 07/12/2010  Document Revised: 09/01/2011 Document Reviewed: 07/12/2010 [facility] Patient Information 2015 [place], [place]. This information is not intended to replace advice given to you by your health care provider. Make sure you discuss any questions you have with your health care provider.

## 2014-09-08 NOTE — Progress Notes (Deleted)
   Subjective:    Patient ID: Chad Knapp, male    DOB: 12-Aug-1950, 64 y.o.   MRN: [id number]  HPI    Review of Systems  Constitutional: Positive for fatigue.  HENT: Positive for hearing loss and rhinorrhea.   Eyes: Negative.   Respiratory: Positive for apnea and shortness of breath.   Cardiovascular: Negative.   Gastrointestinal: Negative.   Endocrine: Negative.   Genitourinary: Negative.   Musculoskeletal: Positive for back pain, arthralgias and neck pain.  Skin: Negative.   Allergic/Immunologic: Negative.   Neurological: Positive for tremors and numbness.  Hematological: Negative.   Psychiatric/Behavioral: Positive for sleep disturbance and decreased concentration. The patient is nervous/anxious.        Objective:   Physical Exam        Assessment & Plan:

## 2014-09-08 NOTE — Progress Notes (Addendum)
Subjective:    Patient ID: Chad Knapp, male    DOB: Nov 27, 1950, 64 y.o.   MRN: [id number] This chart was scribed for Delman Cheadle, MD by Zola Button, Medical Scribe. This patient was seen in Room 24 and the patient's care was started at 3:01 PM.   Chief Complaint  Patient presents with  . Annual Exam    HPI HPI Comments: Chad Knapp is a 64 y.o. male with a hx of basal cell carcinoma who presents to the [facility] for a complete physical exam.   Neck Pain: Patient had C6-7 fusion surgery done by Dr. Rolena Infante in August, 2014. He was doing well until 3 weeks ago when he started having the same symptoms again - neck tightness/pain radiating down his left arm with left arm numbness. He also started having some numbness in his right arm, which is new for him.   Back Pain: Patient also has had lumbar back pain radiating to his hips for several years. The right hip is described more of a burning sensation and the left hip is described as a pain. He has been seen by the [facility] for this and was found to have herniated disc. This pain has been gradually worsening. The pain is worse when standing still or sitting in the same position for minutes at a time; it is not as bad when walking. He had an MRI of his lower back when he had surgery at Dr. Rolena Infante' office; this was done in error.  Tremors: Patient notes having some tremors in his hands occasionally, mostly when he holds small things such as a can. His father did not have tremors as far as the patient is aware.  Seasonal Allergies: Patient takes loratadine every morning for allergies.  Toenail Fungus: Patient reports having some toenail fungus. He had been put on Lamisil in the past a long time ago, but he does not remember if it was effective. He has had his toenail removed and was told the fungus would not grow back, but it did return. Patient denies pain to the area.  Health Maintenance: Patient goes to the [place] about every 6  months. He had a colonoscopy done at [facility], set up through the [place], about 3-4 years ago. He was told to have a repeat colonoscopy in 5 years. His father does have hx of prostate cancer. Last visit to PCP at [facility] was in September of last year. He sees his dermatologist every 6 months due to hx of basal cell carcinoma. He has had his shingles vaccine a few years ago. Patient does not have a urologist and does not have any urinary symptoms. Patient takes multivitamins daily, naproxen once daily (for 5-6 years), and baby aspirin daily. He had been diagnosed with severe sleep apnea and was put on a CPAP; he has been sleeping well since then. Patient has not had hearing testing done. He denies chest pain, indigestion, heartburn. Dentist: Demetrius Revel.  Patient retired in January, 2015.  Past Medical History  Diagnosis Date  . Arthritis   . Depression   . Anxiety   . Sleep apnea     cpap  . Cancer     basal cell -removed  . Hx of transfusion of packed red blood cells 1974  . Shortness of breath    Past Surgical History  Procedure Laterality Date  . Appendectomy    . Tonsillectomy    . Fracture surgery Left     tib/fib/femur  .  Cervical spine surgery  02/03/2013    Dr Rolena Infante  . Anterior cervical decomp/discectomy fusion N/A 02/03/2013    Procedure: ACDF C6 - 7     1 LEVEL;  Surgeon: Melina Schools, MD;  Location: [facility];  Service: Orthopedics;  Laterality: N/A;   Current Outpatient Prescriptions on File Prior to Visit  Medication Sig Dispense Refill  . albuterol (PROVENTIL HFA;VENTOLIN HFA) 108 (90 BASE) MCG/ACT inhaler Inhale 2 puffs into the lungs every 4 (four) hours as needed for wheezing or shortness of breath.    Marland Kitchen aspirin 81 MG tablet Take 81 mg by mouth daily.    . citalopram (CELEXA) 20 MG tablet Take 20 mg by mouth daily.    . fluticasone (FLONASE) 50 MCG/ACT nasal spray Place 2 sprays into the nose daily as needed for rhinitis or allergies.    Marland Kitchen ketoconazole (NIZORAL) 2 % cream  Apply 1 application topically 2 (two) times daily.     . Multiple Vitamin (MULTIVITAMIN WITH MINERALS) TABS tablet Take 1 tablet by mouth daily.    Marland Kitchen docusate sodium (COLACE) 100 MG capsule Take 1 capsule (100 mg total) by mouth 3 (three) times daily as needed for constipation. (Patient not taking: Reported on 09/08/2014) 30 capsule 0  . Lactobacillus (ACIDOPHILUS PO) Take 1 tablet by mouth daily.    . methocarbamol (ROBAXIN) 500 MG tablet Take 1 tablet (500 mg total) by mouth 3 (three) times daily as needed. (Patient not taking: Reported on 09/08/2014) 60 tablet 0  . Omega-3 Fatty Acids (FISH OIL) 600 MG CAPS Take 600 mg by mouth daily.    . ondansetron (ZOFRAN) 4 MG tablet Take 1 tablet (4 mg total) by mouth every 8 (eight) hours as needed for nausea. (Patient not taking: Reported on 09/08/2014) 20 tablet 0  . oxyCODONE-acetaminophen (PERCOCET) 10-325 MG per tablet Take 1 tablet by mouth every 4 (four) hours as needed for pain. (Patient not taking: Reported on 09/08/2014) 60 tablet 0  . polyethylene glycol powder (GLYCOLAX) powder Take 17 g by mouth daily. (Patient not taking: Reported on 09/08/2014) 255 g 1  . terbinafine (LAMISIL) 1 % cream Apply 1 application topically 2 (two) times daily.     No current facility-administered medications on file prior to visit.   No Known Allergies Family History  Problem Relation Age of Onset  . Mental illness Mother    History   Social History  . Marital Status: Married    Spouse Name: N/A  . Number of Children: N/A  . Years of Education: N/A   Social History Main Topics  . Smoking status: Former Smoker    Quit date: 06/20/2007  . Smokeless tobacco: Never Used  . Alcohol Use: 0.0 oz/week     Comment: socially  . Drug Use: No  . Sexual Activity: Yes   Other Topics Concern  . None   Social History Narrative    Review of Systems  All other systems reviewed and are negative.  Review of Systems  Constitutional: Positive for fatigue.  HENT:  Positive for hearing loss and rhinorrhea.   Eyes: Negative.   Respiratory: Positive for apnea and shortness of breath.   Cardiovascular: Negative.   Gastrointestinal: Negative.   Endocrine: Negative.   Genitourinary: Negative.   Musculoskeletal: Positive for back pain, arthralgias and neck pain.  Skin: Negative.   Allergic/Immunologic: Negative.   Neurological: Positive for tremors and numbness.  Hematological: Negative.   Psychiatric/Behavioral: Positive for sleep disturbance and decreased concentration. The patient is nervous/anxious.  Objective:  BP 125/84 mmHg  Pulse 78  Temp(Src) 97.9 F (36.6 C) (Oral)  Resp 16  Ht 6\' 2"  (1.88 m)  Wt 264 lb 12.8 oz (120.112 kg)  BMI 33.98 kg/m2  SpO2 96%  Physical Exam  Constitutional: He is oriented to person, place, and time. He appears well-developed and well-nourished. No distress.  HENT:  Head: Normocephalic and atraumatic.  Right Ear: Tympanic membrane normal.  Left Ear: Tympanic membrane normal.  Mouth/Throat: Oropharynx is clear and moist. No oropharyngeal exudate.  Eyes: Pupils are equal, round, and reactive to light.  Neck: Neck supple. No thyromegaly present.  Normal thyroid.  Cardiovascular: Normal rate.   Pulmonary/Chest: Effort normal.  Genitourinary: Prostate normal. Rectal exam shows external hemorrhoid.  Moderate non-inflamed external hemorrhoid, non-thrombosed. Normal rectum. Normal prostate.  Musculoskeletal: He exhibits no edema.  Lymphadenopathy:    He has no cervical adenopathy.  Neurological: He is alert and oriented to person, place, and time. No cranial nerve deficit.  Reflex Scores:      Patellar reflexes are 2+ on the right side and 2+ on the left side. Skin: Skin is warm and dry. No rash noted.  Psychiatric: He has a normal mood and affect. His behavior is normal.  Nursing note and vitals reviewed.   [facility] reading (PRIMARY) by  Dr. Brigitte Pulse. c-spine:  Surgical hardware appears in place at c7-t1 -  severe disc degeneration and c6-vertebral spurring   l-spine: spondylolisthesis at L4-5 with degenerative spurring.  EKG: NSR, no ischemic change  Assessment & Plan:  Neck pain - Plan: DG Cervical Spine 2 or 3 views, DG Cervical Spine 2 or 3 views  Low back pain with sciatica, sciatica laterality unspecified, unspecified back pain laterality - Plan: DG Lumbar Spine 2-3 Views, DG Lumbar Spine 2-3 Views  Need for Tdap vaccination - Plan: Tdap vaccine greater than or equal to 7yo IM  Preventative health care - Plan: Vit D  25 hydroxy (rtn osteoporosis monitoring), PSA, Lipid panel, COMPLETE METABOLIC PANEL WITH GFR, CBC with Differential/Platelet, TSH  Screening for cardiovascular, respiratory, and genitourinary diseases - Plan: EKG 12-Lead  Onychomycosis  Obstructive sleep apnea  Screening for thyroid disorder  Screening for STD (sexually transmitted disease) - Plan: TSH  Screening for deficiency anemia - Plan: COMPLETE METABOLIC PANEL WITH GFR, CBC with Differential/Platelet  Screening for prostate cancer - Plan: PSA    I personally performed the services described in this documentation, which was scribed in my presence. The recorded information has been reviewed and considered, and addended by me as needed.  Delman Cheadle, MD MPH

## 2014-09-09 LAB — LIPID PANEL
CHOL/HDL RATIO: 4 ratio
CHOLESTEROL: 175 mg/dL (ref 0–200)
HDL: 44 mg/dL (ref >=40)
LDL Cholesterol: 115 mg/dL — ABNORMAL HIGH (ref 0–99)
TRIGLYCERIDES: 82 mg/dL (ref <150)
VLDL: 16 mg/dL (ref 0–40)

## 2014-09-09 LAB — COMPLETE METABOLIC PANEL WITH GFR
ALT: 18 U/L (ref 0–53)
AST: 15 U/L (ref 0–37)
Albumin: 3.9 g/dL (ref 3.5–5.2)
Alkaline Phosphatase: 70 U/L (ref 39–117)
BILIRUBIN TOTAL: 0.4 mg/dL (ref 0.2–1.2)
BUN: 15 mg/dL (ref 6–23)
CALCIUM: 9.1 mg/dL (ref 8.4–10.5)
CHLORIDE: 106 meq/L (ref 96–112)
CO2: 21 mEq/L (ref 19–32)
CREATININE: 0.93 mg/dL (ref 0.50–1.35)
GFR, Est African American: >89 mL/min
GFR, Est Non African American: 86 mL/min
Glucose, Bld: 91 mg/dL (ref 70–99)
Potassium: 4.1 mEq/L (ref 3.5–5.3)
Sodium: 137 mEq/L (ref 135–145)
Total Protein: 6.5 g/dL (ref 6.0–8.3)

## 2014-09-09 LAB — PSA: PSA: 1.25 ng/mL (ref <=4.00)

## 2014-09-09 LAB — VITAMIN D 25 HYDROXY (VIT D DEFICIENCY, FRACTURES): VIT D 25 HYDROXY: 35 ng/mL (ref 30–100)

## 2014-09-09 LAB — TSH: TSH: 2.361 u[IU]/mL (ref 0.350–4.500)

## 2014-10-15 NOTE — Addendum Note (Signed)
Addended by: Brigitte Pulse, Mckinzey Entwistle on: 10/15/2014 01:00 AM   Modules accepted: Miquel Dunn

## 2015-05-14 ENCOUNTER — Ambulatory Visit (INDEPENDENT_AMBULATORY_CARE_PROVIDER_SITE_OTHER): Payer: Federal, State, Local not specified - PPO | Admitting: Urgent Care

## 2015-05-14 VITALS — BP 122/74 | HR 80 | Temp 98.0°F | Resp 16 | Ht 74.0 in | Wt 281.0 lb

## 2015-05-14 DIAGNOSIS — Z87442 Personal history of urinary calculi: Secondary | ICD-10-CM

## 2015-05-14 DIAGNOSIS — M549 Dorsalgia, unspecified: Secondary | ICD-10-CM

## 2015-05-14 LAB — POC MICROSCOPIC URINALYSIS (UMFC): MUCUS RE: ABSENT

## 2015-05-14 LAB — POCT URINALYSIS DIP (MANUAL ENTRY)
BILIRUBIN UA: NEGATIVE
BILIRUBIN UA: NEGATIVE
Blood, UA: NEGATIVE
GLUCOSE UA: NEGATIVE
Leukocytes, UA: NEGATIVE
Nitrite, UA: NEGATIVE
Protein Ur, POC: NEGATIVE
SPEC GRAV UA: 1.02
Urobilinogen, UA: 0.2
pH, UA: 5.5

## 2015-05-14 MED ORDER — TRAMADOL HCL 50 MG PO TABS: 50.0000 mg | ORAL_TABLET | Freq: Three times a day (TID) | ORAL | Status: DC | PRN

## 2015-05-14 MED ORDER — METHOCARBAMOL 500 MG PO TABS: 500.0000 mg | ORAL_TABLET | Freq: Three times a day (TID) | ORAL | Status: DC | PRN

## 2015-05-14 MED ORDER — TAMSULOSIN HCL 0.4 MG PO CAPS: 0.4000 mg | ORAL_CAPSULE | Freq: Every day | ORAL | Status: DC

## 2015-05-14 NOTE — Progress Notes (Signed)
MRN: [id number] DOB: 04-08-1951  Subjective:   Chad Knapp is a 64 y.o. male with pmh of renal stones presenting for chief complaint of Back Pain  Reports 4 day history of worsening low back pain. Yesterday, his back pain was at its worst, felt really uncomfortable in any position. Has also had some nausea, urinary frequency. Has tried Robaxin with minimal relief. Patient last had a kidney stone 2-3 years ago, passed it on his own. This pain feels very much the same as his previous kidney stones. Has had shockwave lithotripsy before as well. Denies fever, abdominal pain, pelvic pain, vomiting, hematuria, back trauma, history of low back injury. He has bowel movements every day. Denies bloody stools. Denies straining.  Chad Knapp has a current medication list which includes the following prescription(s): albuterol, aspirin, fluticasone, multivitamin with minerals, naproxen, terbinafine, and citalopram. Also has No Known Allergies.  Chad Knapp  has a past medical history of Arthritis; Depression; Anxiety; Sleep apnea; Cancer [facility]); transfusion of packed red blood cells (1974); and Shortness of breath. Also  has past surgical history that includes Appendectomy; Tonsillectomy; Fracture surgery (Left); Cervical spine surgery (02/03/2013); Anterior cervical decomp/discectomy fusion (N/A, 02/03/2013); and Excision basal cell carcinoma.  Objective:   Vitals: BP 122/74 mmHg  Pulse 80  Temp(Src) 98 F (36.7 C) (Oral)  Resp 16  Ht 6\' 2"  (1.88 m)  Wt 281 lb (127.461 kg)  BMI 36.06 kg/m2  SpO2 97%  Physical Exam  Constitutional: He is oriented to person, place, and time. He appears well-developed and well-nourished.  Cardiovascular: Normal rate, regular rhythm and intact distal pulses.  Exam reveals no gallop and no friction rub.   No murmur heard. Pulmonary/Chest: No respiratory distress. He has no wheezes. He has no rales.  Abdominal: Soft. Bowel sounds are normal. He exhibits no distension and no  mass. There is no tenderness.  Musculoskeletal:       Lumbar back: He exhibits tenderness (over area depicted). He exhibits no bony tenderness, no swelling, no edema, no deformity and no laceration.       Back:  Neurological: He is alert and oriented to person, place, and time.  Skin: Skin is warm and dry. No rash noted. No erythema. No pallor.   Results for orders placed or performed in visit on 05/14/15 (from the past 24 hour(s))  POCT urinalysis dipstick     Status: Abnormal   Collection Time: 05/14/15 10:03 AM  Result Value Ref Range   Color, UA yellow yellow   Clarity, UA cloudy (A) clear   Glucose, UA negative negative   Bilirubin, UA negative negative   Ketones, POC UA negative negative   Spec Grav, UA 1.020    Blood, UA negative negative   pH, UA 5.5    Protein Ur, POC negative negative   Urobilinogen, UA 0.2    Nitrite, UA Negative Negative   Leukocytes, UA Negative Negative  POCT Microscopic Urinalysis ([facility])     Status: Abnormal   Collection Time: 05/14/15 10:03 AM  Result Value Ref Range   WBC,UR,HPF,POC None None WBC/hpf   RBC,UR,HPF,POC None None RBC/hpf   Bacteria None None, Too numerous to count   Mucus Absent Absent   Epithelial Cells, UR Per Microscopy Few (A) None, Too numerous to count cells/hpf   Assessment and Plan :   1. Bilateral back pain, unspecified location 2. History of renal stone - Recurrent renal stone versus constipation, DDD, ascending UTI. Start aggressive hydration, Flomax, use Tylenol as needed for  pain. Strain urine for kidney stone. Tramadol for breakthrough pain. Rtc in 1 week if no improvement. Patient agreed.   Jaynee Eagles, PA-C [facility] [phone] 05/14/2015 9:33 AM

## 2015-05-14 NOTE — Patient Instructions (Addendum)
Renal Colic Renal colic is pain that is caused by passing a kidney stone. The pain can be sharp and severe. It may be felt in the back, abdomen, side (flank), or groin. It can cause nausea. Renal colic can come and go. HOME CARE INSTRUCTIONS Watch your condition for any changes. The following actions may help to lessen any discomfort that you are feeling:  Take medicines only as directed by your health care provider.  Ask your health care provider if it is okay to take over-the-counter pain medicine.  Drink enough fluid to keep your urine clear or pale yellow. Drink 6-8 glasses of water each day.  Limit the amount of salt that you eat to less than 2 grams per day.  Reduce the amount of protein in your diet. Eat less meat, fish, nuts, and dairy.  Avoid foods such as spinach, rhubarb, nuts, or bran. These may make kidney stones more likely to form. SEEK MEDICAL CARE IF:  You have a fever or chills.  Your urine smells bad or looks cloudy.  You have pain or burning when you pass urine. SEEK IMMEDIATE MEDICAL CARE IF:  Your flank pain or groin pain suddenly worsens.  You become confused or disoriented or you lose consciousness.   This information is not intended to replace advice given to you by your health care provider. Make sure you discuss any questions you have with your health care provider.   Document Released: 03/19/2005 Document Revised: 06/30/2014 Document Reviewed: 04/19/2014 Elsevier Interactive Patient Education 2016 Elsevier Inc.    Tramadol tablets What is this medicine? TRAMADOL (TRA ma dole) is a pain reliever. It is used to treat moderate to severe pain in adults. This medicine may be used for other purposes; ask your health care provider or pharmacist if you have questions. What should I tell my health care provider before I take this medicine? They need to know if you have any of these conditions: -brain tumor -depression -drug abuse or addiction -head  injury -if you frequently drink alcohol containing drinks -kidney disease or trouble passing urine -liver disease -lung disease, asthma, or breathing problems -seizures or epilepsy -suicidal thoughts, plans, or attempt; a previous suicide attempt by you or a family member -an unusual or allergic reaction to tramadol, codeine, other medicines, foods, dyes, or preservatives -pregnant or trying to get pregnant -breast-feeding How should I use this medicine? Take this medicine by mouth with a full glass of water. Follow the directions on the prescription label. If the medicine upsets your stomach, take it with food or milk. Do not take more medicine than you are told to take. Talk to your pediatrician regarding the use of this medicine in children. Special care may be needed. Overdosage: If you think you have taken too much of this medicine contact a poison control center or emergency room at once. NOTE: This medicine is only for you. Do not share this medicine with others. What if I miss a dose? If you miss a dose, take it as soon as you can. If it is almost time for your next dose, take only that dose. Do not take double or extra doses. What may interact with this medicine? Do not take this medicine with any of the following medications: -MAOIs like Carbex, Eldepryl, Marplan, Nardil, and Parnate This medicine may also interact with the following medications: -alcohol or medicines that contain alcohol -antihistamines -benzodiazepines -bupropion -carbamazepine or oxcarbazepine -clozapine -cyclobenzaprine -digoxin -furazolidone -linezolid -medicines for depression, anxiety, or psychotic disturbances -  medicines for migraine headache like almotriptan, eletriptan, frovatriptan, naratriptan, rizatriptan, sumatriptan, zolmitriptan -medicines for pain like pentazocine, buprenorphine, butorphanol, meperidine, nalbuphine, and propoxyphene -medicines for sleep -muscle  relaxants -naltrexone -phenobarbital -phenothiazines like perphenazine, thioridazine, chlorpromazine, mesoridazine, fluphenazine, prochlorperazine, promazine, and trifluoperazine -procarbazine -warfarin This list may not describe all possible interactions. Give your health care provider a list of all the medicines, herbs, non-prescription drugs, or dietary supplements you use. Also tell them if you smoke, drink alcohol, or use illegal drugs. Some items may interact with your medicine. What should I watch for while using this medicine? Tell your doctor or health care professional if your pain does not go away, if it gets worse, or if you have new or a different type of pain. You may develop tolerance to the medicine. Tolerance means that you will need a higher dose of the medicine for pain relief. Tolerance is normal and is expected if you take this medicine for a long time. Do not suddenly stop taking your medicine because you may develop a severe reaction. Your body becomes used to the medicine. This does NOT mean you are addicted. Addiction is a behavior related to getting and using a drug for a non-medical reason. If you have pain, you have a medical reason to take pain medicine. Your doctor will tell you how much medicine to take. If your doctor wants you to stop the medicine, the dose will be slowly lowered over time to avoid any side effects. You may get drowsy or dizzy. Do not drive, use machinery, or do anything that needs mental alertness until you know how this medicine affects you. Do not stand or sit up quickly, especially if you are an older patient. This reduces the risk of dizzy or fainting spells. Alcohol can increase or decrease the effects of this medicine. Avoid alcoholic drinks. You may have constipation. Try to have a bowel movement at least every 2 to 3 days. If you do not have a bowel movement for 3 days, call your doctor or health care professional. Your mouth may get dry. Chewing  sugarless gum or sucking hard candy, and drinking plenty of water may help. Contact your doctor if the problem does not go away or is severe. What side effects may I notice from receiving this medicine? Side effects that you should report to your doctor or health care professional as soon as possible: -allergic reactions like skin rash, itching or hives, swelling of the face, lips, or tongue -breathing difficulties, wheezing -confusion -itching -light headedness or fainting spells -redness, blistering, peeling or loosening of the skin, including inside the mouth -seizures Side effects that usually do not require medical attention (report to your doctor or health care professional if they continue or are bothersome): -constipation -dizziness -drowsiness -headache -nausea, vomiting This list may not describe all possible side effects. Call your doctor for medical advice about side effects. You may report side effects to FDA at 1-800-FDA-1088. Where should I keep my medicine? Keep out of the reach of children. This medicine may cause accidental overdose and death if it taken by other adults, children, or pets. Mix any unused medicine with a substance like cat litter or coffee grounds. Then throw the medicine away in a sealed container like a sealed bag or a coffee can with a lid. Do not use the medicine after the expiration date. Store at room temperature between 15 and 30 degrees C (59 and 86 degrees F). NOTE: This sheet is a summary. It may not  cover all possible information. If you have questions about this medicine, talk to your doctor, pharmacist, or health care provider.    2016, Elsevier/Gold Standard. (2013-08-05 15:42:09)

## 2015-06-04 ENCOUNTER — Ambulatory Visit (INDEPENDENT_AMBULATORY_CARE_PROVIDER_SITE_OTHER): Payer: Federal, State, Local not specified - PPO | Admitting: Internal Medicine

## 2015-06-04 ENCOUNTER — Ambulatory Visit (INDEPENDENT_AMBULATORY_CARE_PROVIDER_SITE_OTHER): Payer: Federal, State, Local not specified - PPO

## 2015-06-04 VITALS — BP 130/88 | HR 79 | Temp 98.1°F | Resp 16 | Ht 74.0 in | Wt 274.0 lb

## 2015-06-04 DIAGNOSIS — Z87442 Personal history of urinary calculi: Secondary | ICD-10-CM

## 2015-06-04 DIAGNOSIS — K5732 Diverticulitis of large intestine without perforation or abscess without bleeding: Secondary | ICD-10-CM | POA: Diagnosis not present

## 2015-06-04 DIAGNOSIS — R1012 Left upper quadrant pain: Secondary | ICD-10-CM | POA: Diagnosis not present

## 2015-06-04 DIAGNOSIS — R110 Nausea: Secondary | ICD-10-CM

## 2015-06-04 DIAGNOSIS — K5901 Slow transit constipation: Secondary | ICD-10-CM | POA: Diagnosis not present

## 2015-06-04 LAB — POCT URINALYSIS DIP (MANUAL ENTRY)
Bilirubin, UA: NEGATIVE
GLUCOSE UA: NEGATIVE
Ketones, POC UA: NEGATIVE
Leukocytes, UA: NEGATIVE
Nitrite, UA: NEGATIVE
PH UA: 7
Protein Ur, POC: NEGATIVE
RBC UA: NEGATIVE
SPEC GRAV UA: 1.02
UROBILINOGEN UA: 0.2

## 2015-06-04 LAB — POC MICROSCOPIC URINALYSIS (UMFC): Mucus: ABSENT

## 2015-06-04 LAB — COMPREHENSIVE METABOLIC PANEL
ALK PHOS: 75 U/L (ref 40–115)
ALT: 27 U/L (ref 9–46)
AST: 23 U/L (ref 10–35)
Albumin: 4.1 g/dL (ref 3.6–5.1)
BILIRUBIN TOTAL: 0.5 mg/dL (ref 0.2–1.2)
BUN: 11 mg/dL (ref 7–25)
CALCIUM: 9.3 mg/dL (ref 8.6–10.3)
CO2: 25 mmol/L (ref 20–31)
Chloride: 99 mmol/L (ref 98–110)
Creat: 0.98 mg/dL (ref 0.70–1.25)
GLUCOSE: 89 mg/dL (ref 65–99)
Potassium: 4.5 mmol/L (ref 3.5–5.3)
Sodium: 141 mmol/L (ref 135–146)
TOTAL PROTEIN: 7.3 g/dL (ref 6.1–8.1)

## 2015-06-04 LAB — POCT CBC
Granulocyte percent: 67.3 %G (ref 37–80)
HCT, POC: 48.7 % (ref 43.5–53.7)
HEMOGLOBIN: 17 g/dL (ref 14.1–18.1)
LYMPH, POC: 1.9 (ref 0.6–3.4)
MCH, POC: 31.9 pg — AB (ref 27–31.2)
MCHC: 34.8 g/dL (ref 31.8–35.4)
MCV: 91.6 fL (ref 80–97)
MID (cbc): 0.7 (ref 0–0.9)
MPV: 5.4 fL (ref 0–99.8)
PLATELET COUNT, POC: 213 10*3/uL (ref 142–424)
POC Granulocyte: 5.2 (ref 2–6.9)
POC LYMPH %: 24.1 % (ref 10–50)
POC MID %: 8.6 %M (ref 0–12)
RBC: 5.32 M/uL (ref 4.69–6.13)
RDW, POC: 12.6 %
WBC: 7.7 10*3/uL (ref 4.6–10.2)

## 2015-06-04 LAB — POCT SEDIMENTATION RATE: POCT SED RATE: 38 mm/h — AB (ref 0–22)

## 2015-06-04 LAB — LIPASE: Lipase: 57 U/L (ref 7–60)

## 2015-06-04 LAB — GLUCOSE, POCT (MANUAL RESULT ENTRY): POC GLUCOSE: 91 mg/dL (ref 70–99)

## 2015-06-04 MED ORDER — METRONIDAZOLE 250 MG PO TABS: 250.0000 mg | ORAL_TABLET | Freq: Three times a day (TID) | ORAL | Status: DC

## 2015-06-04 MED ORDER — CIPROFLOXACIN HCL 500 MG PO TABS: 500.0000 mg | ORAL_TABLET | Freq: Two times a day (BID) | ORAL | Status: DC

## 2015-06-04 NOTE — Patient Instructions (Signed)
Constipation, Adult Constipation is when a person has fewer than three bowel movements a week, has difficulty having a bowel movement, or has stools that are dry, hard, or larger than normal. As people grow older, constipation is more common. A low-fiber diet, not taking in enough fluids, and taking certain medicines may make constipation worse.  CAUSES   Certain medicines, such as antidepressants, pain medicine, iron supplements, antacids, and water pills.   Certain diseases, such as diabetes, irritable bowel syndrome (IBS), thyroid disease, or depression.   Not drinking enough water.   Not eating enough fiber-rich foods.   Stress or travel.   Lack of physical activity or exercise.   Ignoring the urge to have a bowel movement.   Using laxatives too much.  SIGNS AND SYMPTOMS   Having fewer than three bowel movements a week.   Straining to have a bowel movement.   Having stools that are hard, dry, or larger than normal.   Feeling full or bloated.   Pain in the lower abdomen.   Not feeling relief after having a bowel movement.  DIAGNOSIS  Your health care provider will take a medical history and perform a physical exam. Further testing may be done for severe constipation. Some tests may include:  A barium enema X-ray to examine your rectum, colon, and, sometimes, your small intestine.   A sigmoidoscopy to examine your lower colon.   A colonoscopy to examine your entire colon. TREATMENT  Treatment will depend on the severity of your constipation and what is causing it. Some dietary treatments include drinking more fluids and eating more fiber-rich foods. Lifestyle treatments may include regular exercise. If these diet and lifestyle recommendations do not help, your health care provider may recommend taking over-the-counter laxative medicines to help you have bowel movements. Prescription medicines may be prescribed if over-the-counter medicines do not work.    HOME CARE INSTRUCTIONS   Eat foods that have a lot of fiber, such as fruits, vegetables, whole grains, and beans.  Limit foods high in fat and processed sugars, such as french fries, hamburgers, cookies, candies, and soda.   A fiber supplement may be added to your diet if you cannot get enough fiber from foods.   Drink enough fluids to keep your urine clear or pale yellow.   Exercise regularly or as directed by your health care provider.   Go to the restroom when you have the urge to go. Do not hold it.   Only take over-the-counter or prescription medicines as directed by your health care provider. Do not take other medicines for constipation without talking to your health care provider first.  [place] IF:   You have bright red blood in your stool.   Your constipation lasts for more than 4 days or gets worse.   You have abdominal or rectal pain.   You have thin, pencil-like stools.   You have unexplained weight loss. MAKE SURE YOU:   Understand these instructions.  Will watch your condition.  Will get help right away if you are not doing well or get worse.   This information is not intended to replace advice given to you by your health care provider. Make sure you discuss any questions you have with your health care provider.   Document Released: 03/07/2004 Document Revised: 06/30/2014 Document Reviewed: 03/21/2013 Elsevier Interactive Patient Education 2016 [employer]. Diverticulitis Diverticulitis is inflammation or infection of small pouches in your colon that form when you have a condition called  diverticulosis. The pouches in your colon are called diverticula. Your colon, or large intestine, is where water is absorbed and stool is formed. Complications of diverticulitis can include:  Bleeding.  Severe infection.  Severe pain.  Perforation of your colon.  Obstruction of your colon. CAUSES  Diverticulitis is caused by  bacteria. Diverticulitis happens when stool becomes trapped in diverticula. This allows bacteria to grow in the diverticula, which can lead to inflammation and infection. RISK FACTORS People with diverticulosis are at risk for diverticulitis. Eating a diet that does not include enough fiber from fruits and vegetables may make diverticulitis more likely to develop. SYMPTOMS  Symptoms of diverticulitis may include:  Abdominal pain and tenderness. The pain is normally located on the left side of the abdomen, but may occur in other areas.  Fever and chills.  Bloating.  Cramping.  Nausea.  Vomiting.  Constipation.  Diarrhea.  Blood in your stool. DIAGNOSIS  Your health care provider will ask you about your medical history and do a physical exam. You may need to have tests done because many medical conditions can cause the same symptoms as diverticulitis. Tests may include:  Blood tests.  Urine tests.  Imaging tests of the abdomen, including X-rays and CT scans. When your condition is under control, your health care provider may recommend that you have a colonoscopy. A colonoscopy can show how severe your diverticula are and whether something else is causing your symptoms. TREATMENT  Most cases of diverticulitis are mild and can be treated at home. Treatment may include:  Taking over-the-counter pain medicines.  Following a clear liquid diet.  Taking antibiotic medicines by mouth for 7-10 days. More severe cases may be treated at a hospital. Treatment may include:  Not eating or drinking.  Taking prescription pain medicine.  Receiving antibiotic medicines through an IV tube.  Receiving fluids and nutrition through an IV tube.  Surgery. HOME CARE INSTRUCTIONS   Follow your health care provider's instructions carefully.  Follow a full liquid diet or other diet as directed by your health care provider. After your symptoms improve, your health care provider may tell you  to change your diet. He or she may recommend you eat a high-fiber diet. Fruits and vegetables are good sources of fiber. Fiber makes it easier to pass stool.  Take fiber supplements or probiotics as directed by your health care provider.  Only take medicines as directed by your health care provider.  Keep all your follow-up appointments. SEEK MEDICAL CARE IF:   Your pain does not improve.  You have a hard time eating food.  Your bowel movements do not return to normal. SEEK IMMEDIATE MEDICAL CARE IF:   Your pain becomes worse.  Your symptoms do not get better.  Your symptoms suddenly get worse.  You have a fever.  You have repeated vomiting.  You have bloody or black, tarry stools. MAKE SURE YOU:   Understand these instructions.  Will watch your condition.  Will get help right away if you are not doing well or get worse.   This information is not intended to replace advice given to you by your health care provider. Make sure you discuss any questions you have with your health care provider.   Document Released: 03/19/2005 Document Revised: 06/14/2013 Document Reviewed: 05/04/2013 Elsevier Interactive Patient Education [employer].  Return i 1-2days if not better and will asses for need of CT scan

## 2015-06-04 NOTE — Progress Notes (Signed)
64 year old male patient with history of basal cell carcinoma presents today with complaint of left upper quadrant abdominal pain. Patient states he was seen 2 weeks ago for back pain, he was told that he had a kidney stone. Left upper quadrant abdominal pain has been present for 4 days. Chronic pain prevents patient from sleeping. No issues with bowel movements. Patient has had urinary frequency. He states he has had about 4 kidney stones within the past 10 years. No CT or Xray was done in previous visits. Patient denies diarrhea, has had some constipation. Per patient colonoscopy was done about 4 years ago. No history of hernia. Denies fever/chills. Diabetes controlled. Denies Blood in Stool.

## 2015-06-04 NOTE — Progress Notes (Signed)
06/04/2015 at 11:37 AM  [name] / DOB: 05-24-1951 / MRN: [id number]  Problem list reviewed and updated by me where necessary.   SUBJECTIVE  Chad Knapp is a 64 y.o. well appearing male presenting for the chief complaint of left abdominal pain.  64 year old male patient with history of basal cell carcinoma presents today with complaint of left upper quadrant abdominal pain. Patient states he was seen 2 weeks ago for back pain, he was told that he had a kidney stone. Left upper quadrant abdominal pain has been present for 4 days. Chronic pain prevents patient from sleeping. No issues with bowel movements. Patient has had urinary frequency. He states he has had about 4 kidney stones within the past 10 years. No CT or Xray was done in previous visits. Patient denies diarrhea, has had some constipation. Per patient colonoscopy was done about 4 years ago. No history of hernia. Denies fever/chills. Diabetes controlled. Denies Blood in Stool.    He  has a past medical history of Arthritis; Depression; Anxiety; Sleep apnea; Cancer [facility]); transfusion of packed red blood cells (1974); and Shortness of breath.    Medications reviewed and updated by myself where necessary, and exist elsewhere in the encounter.   [name] has No Known Allergies. He  reports that he quit smoking about 7 years ago. He has never used smokeless tobacco. He reports that he drinks alcohol. He reports that he does not use illicit drugs. He  reports that he currently engages in sexual activity. The patient  has past surgical history that includes Appendectomy; Tonsillectomy; Fracture surgery (Left); Cervical spine surgery (02/03/2013); Anterior cervical decomp/discectomy fusion (N/A, 02/03/2013); Excision basal cell carcinoma; and Testicle removal (Left).  His family history includes Mental illness in his mother.  Review of Systems  Constitutional: Negative for fever and chills.  Eyes: Negative.   Respiratory:  Negative.  Negative for shortness of breath.   Cardiovascular: Negative.  Negative for chest pain.  Gastrointestinal: Positive for nausea, abdominal pain and constipation. Negative for vomiting, diarrhea and blood in stool.  Genitourinary: Positive for frequency. Negative for dysuria, urgency, hematuria and flank pain.  Musculoskeletal: Negative for myalgias, back pain, joint pain and neck pain.  Skin: Negative for rash.  Neurological: Negative.  Negative for dizziness, weakness and headaches.  Endo/Heme/Allergies: Negative.   Psychiatric/Behavioral: Negative.     OBJECTIVE  His  height is 6\' 2"  (1.88 m) and weight is 274 lb (124.286 kg). His oral temperature is 98.1 F (36.7 C). His blood pressure is 130/88 and his pulse is 79. His respiration is 16 and oxygen saturation is 96%.  The patient's body mass index is 35.16 kg/(m^2).  Physical Exam  Constitutional: He is oriented to person, place, and time. He appears well-developed and well-nourished. No distress.  HENT:  Head: Normocephalic.  Nose: Nose normal.  Mouth/Throat: Oropharynx is clear and moist.  Eyes: Conjunctivae and EOM are normal. Pupils are equal, round, and reactive to light. No scleral icterus.  Neck: Normal range of motion.  Cardiovascular: Normal rate, regular rhythm and normal heart sounds.   Respiratory: Effort normal and breath sounds normal.  GI: Soft. Normal appearance. He exhibits no distension. Bowel sounds are increased. There is no hepatosplenomegaly. There is tenderness in the left lower quadrant. There is no rigidity, no rebound, no guarding and no CVA tenderness. Hernia confirmed negative in the left inguinal area.    Genitourinary:    Left testis shows no mass, no swelling and  no tenderness. Left testis is descended. Cremasteric reflex is not absent on the left side. Circumcised. No penile tenderness.  Left testicle removed  Musculoskeletal: Normal range of motion.  Lymphadenopathy:       Left: No  inguinal adenopathy present.  Neurological: He is alert and oriented to person, place, and time. No cranial nerve deficit. He exhibits normal muscle tone. Coordination normal.  Skin: No rash noted.  Psychiatric: He has a normal mood and affect. His behavior is normal. Judgment and thought content normal.   [facility] reading (PRIMARY) by  Dr.Beckem Tomberlin. No free air seen, does have small bowel air pattern suggestive of ileus or partial obstruction.    Results for orders placed or performed in visit on 06/04/15 (from the past 24 hour(s))  POCT CBC     Status: Abnormal   Collection Time: 06/04/15 10:52 AM  Result Value Ref Range   WBC 7.7 4.6 - 10.2 K/uL   Lymph, poc 1.9 0.6 - 3.4   POC LYMPH PERCENT 24.1 10 - 50 %L   MID (cbc) 0.7 0 - 0.9   POC MID % 8.6 0 - 12 %M   POC Granulocyte 5.2 2 - 6.9   Granulocyte percent 67.3 37 - 80 %G   RBC 5.32 4.69 - 6.13 M/uL   Hemoglobin 17.0 14.1 - 18.1 g/dL   HCT, POC 48.7 43.5 - 53.7 %   MCV 91.6 80 - 97 fL   MCH, POC 31.9 (A) 27 - 31.2 pg   MCHC 34.8 31.8 - 35.4 g/dL   RDW, POC 12.6 %   Platelet Count, POC 213 142 - 424 K/uL   MPV 5.4 0 - 99.8 fL  POCT glucose (manual entry)     Status: None   Collection Time: 06/04/15 10:52 AM  Result Value Ref Range   POC Glucose 91 70 - 99 mg/dl  POCT urinalysis dipstick     Status: None   Collection Time: 06/04/15 10:52 AM  Result Value Ref Range   Color, UA yellow yellow   Clarity, UA clear clear   Glucose, UA negative negative   Bilirubin, UA negative negative   Ketones, POC UA negative negative   Spec Grav, UA 1.020    Blood, UA negative negative   pH, UA 7.0    Protein Ur, POC negative negative   Urobilinogen, UA 0.2    Nitrite, UA Negative Negative   Leukocytes, UA Negative Negative  POCT Microscopic Urinalysis ([facility])     Status: None   Collection Time: 06/04/15 10:52 AM  Result Value Ref Range   WBC,UR,HPF,POC None None WBC/hpf   RBC,UR,HPF,POC None None RBC/hpf   Bacteria None None, Too numerous  to count   Mucus Absent Absent   Epithelial Cells, UR Per Microscopy None None, Too numerous to count cells/hpf   Normal AAS per radiology.  ASSESSMENT & PLAN  Could be early diverticulitis. Could be constipation only. Treatment trial augmentin and miralax. Natalia was seen today for abdominal pain.  Diagnoses and all orders for this visit:  Left upper quadrant pain -     POCT CBC -     POCT glucose (manual entry) -     POCT SEDIMENTATION RATE -     POCT urinalysis dipstick -     POCT Microscopic Urinalysis ([facility]) -     Lipase -     Comprehensive metabolic panel -     DG Abd Acute W/Chest; Future  Nausea without vomiting -     POCT  CBC -     POCT glucose (manual entry) -     POCT SEDIMENTATION RATE -     POCT urinalysis dipstick -     POCT Microscopic Urinalysis ([facility]) -     Lipase -     Comprehensive metabolic panel -     DG Abd Acute W/Chest; Future  History of kidney stones -     POCT CBC -     POCT glucose (manual entry) -     POCT SEDIMENTATION RATE -     POCT urinalysis dipstick -     POCT Microscopic Urinalysis ([facility]) -     Lipase -     Comprehensive metabolic panel -     DG Abd Acute W/Chest; Future

## 2015-06-06 ENCOUNTER — Ambulatory Visit (INDEPENDENT_AMBULATORY_CARE_PROVIDER_SITE_OTHER): Payer: Federal, State, Local not specified - PPO | Admitting: Family Medicine

## 2015-06-06 VITALS — BP 118/76 | HR 81 | Temp 98.8°F | Resp 16 | Ht 74.0 in | Wt 269.0 lb

## 2015-06-06 DIAGNOSIS — K5732 Diverticulitis of large intestine without perforation or abscess without bleeding: Secondary | ICD-10-CM

## 2015-06-06 DIAGNOSIS — R1032 Left lower quadrant pain: Secondary | ICD-10-CM | POA: Diagnosis not present

## 2015-06-06 NOTE — Progress Notes (Addendum)
Subjective:  This chart was scribed for Merri Ray, MD by Moises Blood, Medical Scribe. This patient was seen in room 5 and the patient's care was started 12:47 PM.   Patient ID: Chad Knapp, male    DOB: 01-13-1951, 64 y.o.   MRN: [id number] Chief Complaint  Patient presents with  . Follow-up    abdominal pain - pt states he is feeling better but still has a slight pain     HPI Chad Knapp is a 64 y.o. male Pt is here for follow up on abd pain. He was seen here with LUQ abd pain with LLQ, rating 8/10 2 days ago. He was diagnosed with constipation and possible diverticulitis, treated with cipro 500 mg bid, flagyl 250 mg tid. His abd xray showed no acute access, cbc normal range with wbc 7.7, slightly elevated sed rate at 38. He had nausea without vomiting.   He's feeling better but he still has a slight pain, rating 3/10. The pain started about a week ago. He's taken 3 doses of miralax. His stools are now soft. He denies blood in stool. He denies nausea, vomiting, diarrhea, fever and constipation today. He denies any complications with his medications.   He states that his last colonoscopy was 4-5 years ago.  In 2007, his colonoscopy showed multiple polyps and diverticulosis.   Patient Active Problem List   Diagnosis Date Noted  . HEMORRHOIDS, INTERNAL 07/15/2007  . RHINITIS 07/15/2007  . GASTRITIS 07/15/2007  . DUODENITIS WITHOUT MENTION OF HEMORRHAGE 07/15/2007  . DIVERTICULOSIS OF COLON 07/15/2007  . ARTHRITIS 07/15/2007  . COLONIC POLYPS, HYPERPLASTIC, HX OF 07/15/2007   Past Medical History  Diagnosis Date  . Arthritis   . Depression   . Anxiety   . Sleep apnea     cpap  . Cancer ([place])     basal cell -removed  . Hx of transfusion of packed red blood cells 1974  . Shortness of breath    Past Surgical History  Procedure Laterality Date  . Appendectomy    . Tonsillectomy    . Fracture surgery Left     tib/fib/femur  . Cervical spine surgery   02/03/2013    Dr Rolena Infante  . Anterior cervical decomp/discectomy fusion N/A 02/03/2013    Procedure: ACDF C6 - 7     1 LEVEL;  Surgeon: Melina Schools, MD;  Location: [facility];  Service: Orthopedics;  Laterality: N/A;  . Basal cell carcinoma excision    . Testicle removal Left    No Known Allergies Prior to Admission medications   Medication Sig Start Date End Date Taking? Authorizing Provider  albuterol (PROVENTIL HFA;VENTOLIN HFA) 108 (90 BASE) MCG/ACT inhaler Inhale 2 puffs into the lungs every 4 (four) hours as needed for wheezing or shortness of breath.   Yes Historical Provider, MD  aspirin 81 MG tablet Take 81 mg by mouth daily.   Yes Historical Provider, MD  ciprofloxacin (CIPRO) 500 MG tablet Take 1 tablet (500 mg total) by mouth 2 (two) times daily. 06/04/15  Yes Orma Flaming, MD  escitalopram (LEXAPRO) 20 MG tablet Take 20 mg by mouth daily.   Yes Historical Provider, MD  fluticasone (FLONASE) 50 MCG/ACT nasal spray Place 2 sprays into the nose daily as needed for rhinitis or allergies.   Yes Historical Provider, MD  methocarbamol (ROBAXIN) 500 MG tablet Take 1 tablet (500 mg total) by mouth 3 (three) times daily as needed. 05/14/15  Yes Jaynee Eagles, PA-C  metroNIDAZOLE (FLAGYL)  250 MG tablet Take 1 tablet (250 mg total) by mouth 3 (three) times daily. 06/04/15  Yes Orma Flaming, MD  Multiple Vitamin (MULTIVITAMIN WITH MINERALS) TABS tablet Take 1 tablet by mouth daily.   Yes Historical Provider, MD  naproxen (NAPROSYN) 500 MG tablet Take 500 mg by mouth 2 (two) times daily with a meal.   Yes Historical Provider, MD  tamsulosin (FLOMAX) 0.4 MG CAPS capsule Take 1 capsule (0.4 mg total) by mouth daily. 05/14/15  Yes Jaynee Eagles, PA-C  terbinafine (LAMISIL) 1 % cream Apply 1 application topically 2 (two) times daily.   Yes Historical Provider, MD  traMADol (ULTRAM) 50 MG tablet Take 1 tablet (50 mg total) by mouth every 8 (eight) hours as needed. 05/14/15  Yes Jaynee Eagles, PA-C   Social  History   Social History  . Marital Status: Married    Spouse Name: N/A  . Number of Children: N/A  . Years of Education: N/A   Occupational History  . Not on file.   Social History Main Topics  . Smoking status: Former Smoker    Quit date: 06/20/2007  . Smokeless tobacco: Never Used  . Alcohol Use: 0.0 oz/week     Comment: socially  . Drug Use: No  . Sexual Activity: Yes   Other Topics Concern  . Not on file   Social History Narrative   Review of Systems  Constitutional: Negative for fever, chills and fatigue.  Respiratory: Negative for cough, chest tightness and shortness of breath.   Cardiovascular: Negative for chest pain.  Gastrointestinal: Positive for abdominal pain. Negative for nausea, vomiting, diarrhea, constipation and blood in stool.  Genitourinary: Negative for dysuria.       Objective:   Physical Exam  Constitutional: He is oriented to person, place, and time. He appears well-developed and well-nourished. No distress.  HENT:  Head: Normocephalic and atraumatic.  Eyes: EOM are normal. Pupils are equal, round, and reactive to light.  Neck: Neck supple.  Cardiovascular: Normal rate.   Pulmonary/Chest: Effort normal. No respiratory distress.  Abdominal: There is tenderness (minimal tenderness) in the left lower quadrant.  Hyperactive not high pitched bowel sounds  Musculoskeletal: Normal range of motion.  Neurological: He is alert and oriented to person, place, and time.  Skin: Skin is warm and dry.  Psychiatric: He has a normal mood and affect. His behavior is normal.  Nursing note and vitals reviewed.   Filed Vitals:   06/06/15 1134  BP: 118/76  Pulse: 81  Temp: 98.8 F (37.1 C)  TempSrc: Oral  Resp: 16  Height: 6\' 2"  (1.88 m)  Weight: 269 lb (122.018 kg)  SpO2: 97%      Assessment & Plan:  Chad Knapp is a 64 y.o. male LLQ abdominal pain  Diverticulitis of colon    -Suspected diverticulitis and has started to improve. Minimal  discomfort on exam. Continue fluids today and into tomorrow if his pain is not significantly improved from today. Restart diet slowly as his pain improves. Continue Cipro and Flagyl at current doses, advised to call gastroenterologist for follow-up within the next 6-8 weeks. RTC precautions if increasing pain or worsening symptoms.  No orders of the defined types were placed in this encounter.   Patient Instructions  Continue the cipro and flagyl at current doses for probable diverticulitis. As pain improves into tomorrow and the next day, can slowly increase your diet.  If any return of pain or worsening - return here or emergency room as  you may need other testing such as a cat scan.  Call your gastroenterologist for follow up in the next month to 6 weeks.  Return to the clinic or go to the nearest emergency room if any of your symptoms worsen or new symptoms occur.  Diverticulitis Diverticulitis is inflammation or infection of small pouches in your colon that form when you have a condition called diverticulosis. The pouches in your colon are called diverticula. Your colon, or large intestine, is where water is absorbed and stool is formed. Complications of diverticulitis can include:  Bleeding.  Severe infection.  Severe pain.  Perforation of your colon.  Obstruction of your colon. CAUSES  Diverticulitis is caused by bacteria. Diverticulitis happens when stool becomes trapped in diverticula. This allows bacteria to grow in the diverticula, which can lead to inflammation and infection. RISK FACTORS People with diverticulosis are at risk for diverticulitis. Eating a diet that does not include enough fiber from fruits and vegetables may make diverticulitis more likely to develop. SYMPTOMS  Symptoms of diverticulitis may include:  Abdominal pain and tenderness. The pain is normally located on the left side of the abdomen, but may occur in other areas.  Fever and  chills.  Bloating.  Cramping.  Nausea.  Vomiting.  Constipation.  Diarrhea.  Blood in your stool. DIAGNOSIS  Your health care provider will ask you about your medical history and do a physical exam. You may need to have tests done because many medical conditions can cause the same symptoms as diverticulitis. Tests may include:  Blood tests.  Urine tests.  Imaging tests of the abdomen, including X-rays and CT scans. When your condition is under control, your health care provider may recommend that you have a colonoscopy. A colonoscopy can show how severe your diverticula are and whether something else is causing your symptoms. TREATMENT  Most cases of diverticulitis are mild and can be treated at home. Treatment may include:  Taking over-the-counter pain medicines.  Following a clear liquid diet.  Taking antibiotic medicines by mouth for 7-10 days. More severe cases may be treated at a hospital. Treatment may include:  Not eating or drinking.  Taking prescription pain medicine.  Receiving antibiotic medicines through an IV tube.  Receiving fluids and nutrition through an IV tube.  Surgery. HOME CARE INSTRUCTIONS   Follow your health care provider's instructions carefully.  Follow a full liquid diet or other diet as directed by your health care provider. After your symptoms improve, your health care provider may tell you to change your diet. He or she may recommend you eat a high-fiber diet. Fruits and vegetables are good sources of fiber. Fiber makes it easier to pass stool.  Take fiber supplements or probiotics as directed by your health care provider.  Only take medicines as directed by your health care provider.  Keep all your follow-up appointments. SEEK MEDICAL CARE IF:   Your pain does not improve.  You have a hard time eating food.  Your bowel movements do not return to normal. SEEK IMMEDIATE MEDICAL CARE IF:   Your pain becomes worse.  Your  symptoms do not get better.  Your symptoms suddenly get worse.  You have a fever.  You have repeated vomiting.  You have bloody or black, tarry stools. MAKE SURE YOU:   Understand these instructions.  Will watch your condition.  Will get help right away if you are not doing well or get worse.   This information is not intended to replace advice  given to you by your health care provider. Make sure you discuss any questions you have with your health care provider.   Document Released: 03/19/2005 Document Revised: 06/14/2013 Document Reviewed: 05/04/2013 Elsevier Interactive Patient Education [employer].         By signing my name below, I, Moises Blood, attest that this documentation has been prepared under the direction and in the presence of Merri Ray, MD. Electronically Signed: Moises Blood, [place]. 06/06/2015 , 12:47 PM .

## 2015-06-06 NOTE — Patient Instructions (Signed)
Continue the cipro and flagyl at current doses for probable diverticulitis. As pain improves into tomorrow and the next day, can slowly increase your diet.  If any return of pain or worsening - return here or emergency room as you may need other testing such as a cat scan.  Call your gastroenterologist for follow up in the next month to 6 weeks.  Return to the clinic or go to the nearest emergency room if any of your symptoms worsen or new symptoms occur.  Diverticulitis Diverticulitis is inflammation or infection of small pouches in your colon that form when you have a condition called diverticulosis. The pouches in your colon are called diverticula. Your colon, or large intestine, is where water is absorbed and stool is formed. Complications of diverticulitis can include:  Bleeding.  Severe infection.  Severe pain.  Perforation of your colon.  Obstruction of your colon. CAUSES  Diverticulitis is caused by bacteria. Diverticulitis happens when stool becomes trapped in diverticula. This allows bacteria to grow in the diverticula, which can lead to inflammation and infection. RISK FACTORS People with diverticulosis are at risk for diverticulitis. Eating a diet that does not include enough fiber from fruits and vegetables may make diverticulitis more likely to develop. SYMPTOMS  Symptoms of diverticulitis may include:  Abdominal pain and tenderness. The pain is normally located on the left side of the abdomen, but may occur in other areas.  Fever and chills.  Bloating.  Cramping.  Nausea.  Vomiting.  Constipation.  Diarrhea.  Blood in your stool. DIAGNOSIS  Your health care provider will ask you about your medical history and do a physical exam. You may need to have tests done because many medical conditions can cause the same symptoms as diverticulitis. Tests may include:  Blood tests.  Urine tests.  Imaging tests of the abdomen, including X-rays and CT scans. When  your condition is under control, your health care provider may recommend that you have a colonoscopy. A colonoscopy can show how severe your diverticula are and whether something else is causing your symptoms. TREATMENT  Most cases of diverticulitis are mild and can be treated at home. Treatment may include:  Taking over-the-counter pain medicines.  Following a clear liquid diet.  Taking antibiotic medicines by mouth for 7-10 days. More severe cases may be treated at a hospital. Treatment may include:  Not eating or drinking.  Taking prescription pain medicine.  Receiving antibiotic medicines through an IV tube.  Receiving fluids and nutrition through an IV tube.  Surgery. HOME CARE INSTRUCTIONS   Follow your health care provider's instructions carefully.  Follow a full liquid diet or other diet as directed by your health care provider. After your symptoms improve, your health care provider may tell you to change your diet. He or she may recommend you eat a high-fiber diet. Fruits and vegetables are good sources of fiber. Fiber makes it easier to pass stool.  Take fiber supplements or probiotics as directed by your health care provider.  Only take medicines as directed by your health care provider.  Keep all your follow-up appointments. SEEK MEDICAL CARE IF:   Your pain does not improve.  You have a hard time eating food.  Your bowel movements do not return to normal. SEEK IMMEDIATE MEDICAL CARE IF:   Your pain becomes worse.  Your symptoms do not get better.  Your symptoms suddenly get worse.  You have a fever.  You have repeated vomiting.  You have bloody or black, tarry  stools. MAKE SURE YOU:   Understand these instructions.  Will watch your condition.  Will get help right away if you are not doing well or get worse.   This information is not intended to replace advice given to you by your health care provider. Make sure you discuss any questions you have  with your health care provider.   Document Released: 03/19/2005 Document Revised: 06/14/2013 Document Reviewed: 05/04/2013 Elsevier Interactive Patient Education [employer].

## 2015-06-12 ENCOUNTER — Ambulatory Visit (INDEPENDENT_AMBULATORY_CARE_PROVIDER_SITE_OTHER): Payer: Federal, State, Local not specified - PPO | Admitting: Urgent Care

## 2015-06-12 VITALS — BP 144/88 | HR 87 | Temp 98.0°F | Resp 17 | Ht 74.0 in | Wt 271.0 lb

## 2015-06-12 DIAGNOSIS — B029 Zoster without complications: Secondary | ICD-10-CM

## 2015-06-12 MED ORDER — HYDROCODONE-ACETAMINOPHEN 5-325 MG PO TABS: 1.0000 | ORAL_TABLET | Freq: Four times a day (QID) | ORAL | Status: DC | PRN

## 2015-06-12 MED ORDER — VALACYCLOVIR HCL 1 G PO TABS: 1000.0000 mg | ORAL_TABLET | Freq: Three times a day (TID) | ORAL | Status: DC

## 2015-06-12 NOTE — Patient Instructions (Signed)

## 2015-06-12 NOTE — Progress Notes (Signed)
    MRN: [id number] DOB: 1951-04-04  Subjective:   Chad Knapp is a 64 y.o. male presenting for chief complaint of shingles per patient  Reports 2 day history of itchy rash that has now become painful over his left torso. Has not tried medications for relief. Denies fever, cough, facial droop, chest congestion, n/v, numbness or tingling. He does not know if he has received Zostavax.  Chad Knapp has a current medication list which includes the following prescription(s): albuterol, aspirin, ciprofloxacin, escitalopram, fluticasone, methocarbamol, metronidazole, multivitamin with minerals, naproxen, tamsulosin, and tramadol. Also has No Known Allergies.  Chad Knapp  has a past medical history of Arthritis; Depression; Anxiety; Sleep apnea; Cancer [facility]); transfusion of packed red blood cells (1974); and Shortness of breath. Also  has past surgical history that includes Appendectomy; Tonsillectomy; Fracture surgery (Left); Cervical spine surgery (02/03/2013); Anterior cervical decomp/discectomy fusion (N/A, 02/03/2013); Excision basal cell carcinoma; and Testicle removal (Left).  Objective:   Vitals: BP 144/88 mmHg  Pulse 87  Temp(Src) 98 F (36.7 C) (Oral)  Resp 17  Ht 6\' 2"  (1.88 m)  Wt 271 lb (122.925 kg)  BMI 34.78 kg/m2  SpO2 98%  Physical Exam  Constitutional: He is oriented to person, place, and time. He appears well-developed and well-nourished.  HENT:  Mouth/Throat: Oropharynx is clear and moist.  TM's intact bilaterally.  Eyes: Right eye exhibits no discharge. Left eye exhibits no discharge. No scleral icterus.  Cardiovascular: Normal rate, regular rhythm and intact distal pulses.  Exam reveals no gallop and no friction rub.   No murmur heard. Pulmonary/Chest: No respiratory distress. He has no wheezes. He has no rales.  Neurological: He is alert and oriented to person, place, and time.  Skin: Skin is warm and dry. Rash noted. There is erythema. No pallor.      Assessment and  Plan :   1. Herpes zoster - Start Valtrex for shingles treatment. Patient will contact the [facility] to check if he has received his shingles vaccination. RTC in 1 week if no improvement or worsening symptoms.  Jaynee Eagles, PA-C [facility] [phone] 06/12/2015 4:24 PM

## 2015-09-17 ENCOUNTER — Other Ambulatory Visit: Payer: Self-pay | Admitting: Urgent Care

## 2015-11-13 ENCOUNTER — Encounter: Payer: Self-pay | Admitting: Gastroenterology

## 2015-12-03 DIAGNOSIS — H903 Sensorineural hearing loss, bilateral: Secondary | ICD-10-CM | POA: Insufficient documentation

## 2015-12-03 DIAGNOSIS — D333 Benign neoplasm of cranial nerves: Secondary | ICD-10-CM | POA: Insufficient documentation

## 2015-12-05 ENCOUNTER — Other Ambulatory Visit: Payer: Self-pay | Admitting: Otolaryngology

## 2015-12-05 DIAGNOSIS — D333 Benign neoplasm of cranial nerves: Secondary | ICD-10-CM

## 2015-12-14 ENCOUNTER — Ambulatory Visit
Admission: RE | Admit: 2015-12-14 | Discharge: 2015-12-14 | Disposition: A | Discharge status: Home / Self Care | Payer: Federal, State, Local not specified - PPO | Source: Ambulatory Visit | Attending: Otolaryngology | Admitting: Otolaryngology

## 2015-12-14 DIAGNOSIS — D333 Benign neoplasm of cranial nerves: Secondary | ICD-10-CM

## 2015-12-14 MED ORDER — GADOBENATE DIMEGLUMINE 529 MG/ML IV SOLN
20.0000 mL | Freq: Once | INTRAVENOUS | Status: AC | PRN
  Administered 2015-12-14: 20 mL via INTRAVENOUS

## 2015-12-21 ENCOUNTER — Other Ambulatory Visit: Payer: Self-pay | Admitting: Urgent Care

## 2015-12-24 ENCOUNTER — Other Ambulatory Visit: Payer: Self-pay | Admitting: Urgent Care

## 2016-01-20 ENCOUNTER — Other Ambulatory Visit: Payer: Self-pay | Admitting: Urgent Care

## 2016-02-21 ENCOUNTER — Other Ambulatory Visit: Payer: Self-pay | Admitting: Family Medicine

## 2016-03-10 IMAGING — CR DG CERVICAL SPINE 2 OR 3 VIEWS
3 series · 3 of 3 positions shown · non-contrast
Comparison: AP and lateral cervical spine images obtained
immediately postoperatively 02/03/2013.

CLINICAL DATA: Acute superimposed upon chronic neck pain.

EXAM:
CERVICAL SPINE - 2-3 VIEW

[lateral]
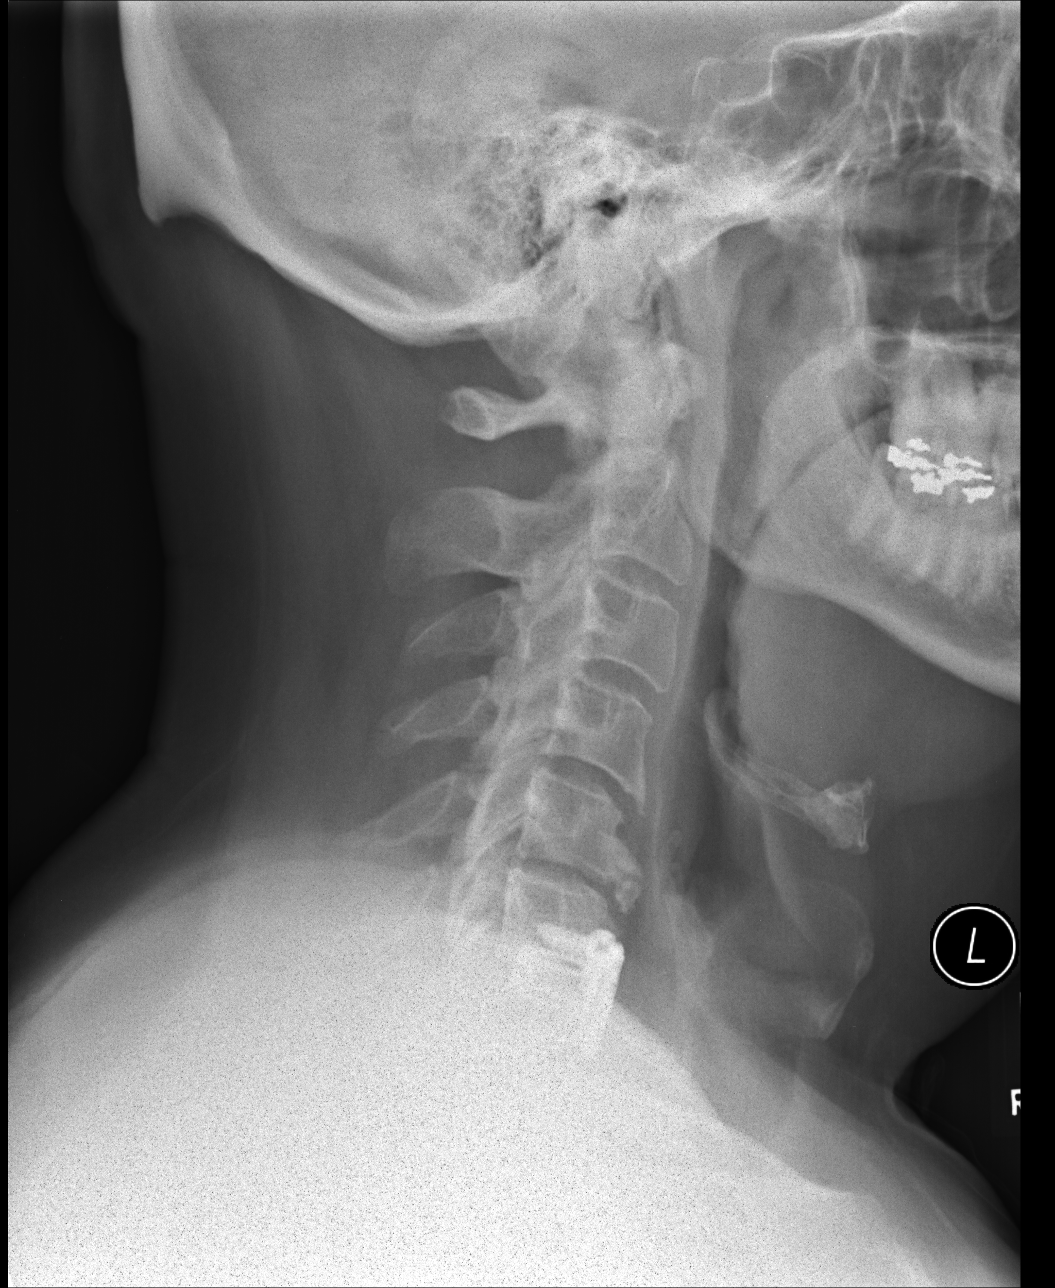

[other]
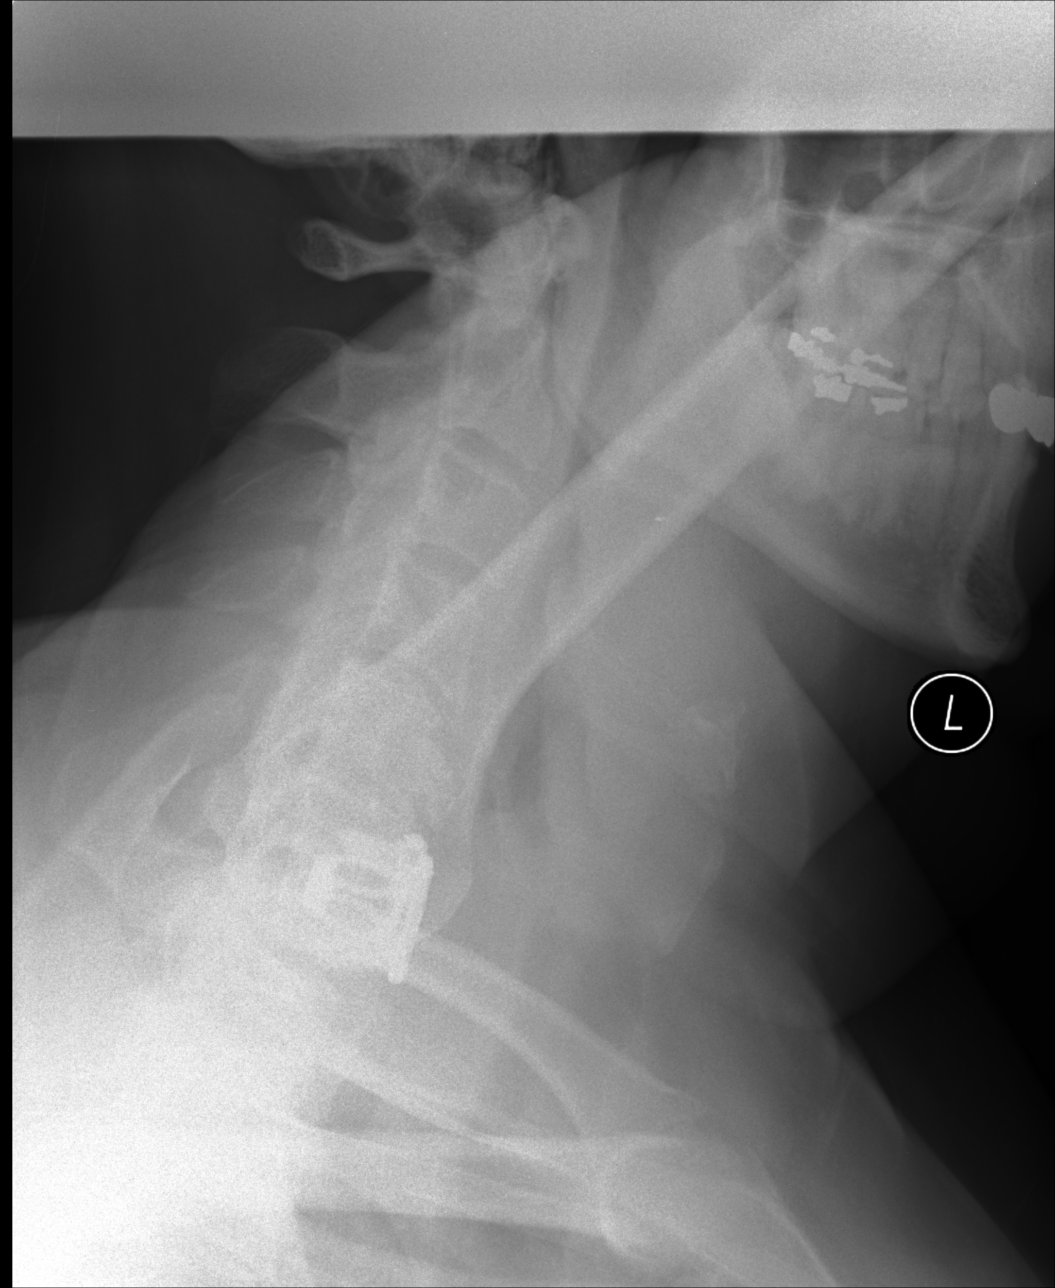

[AP]
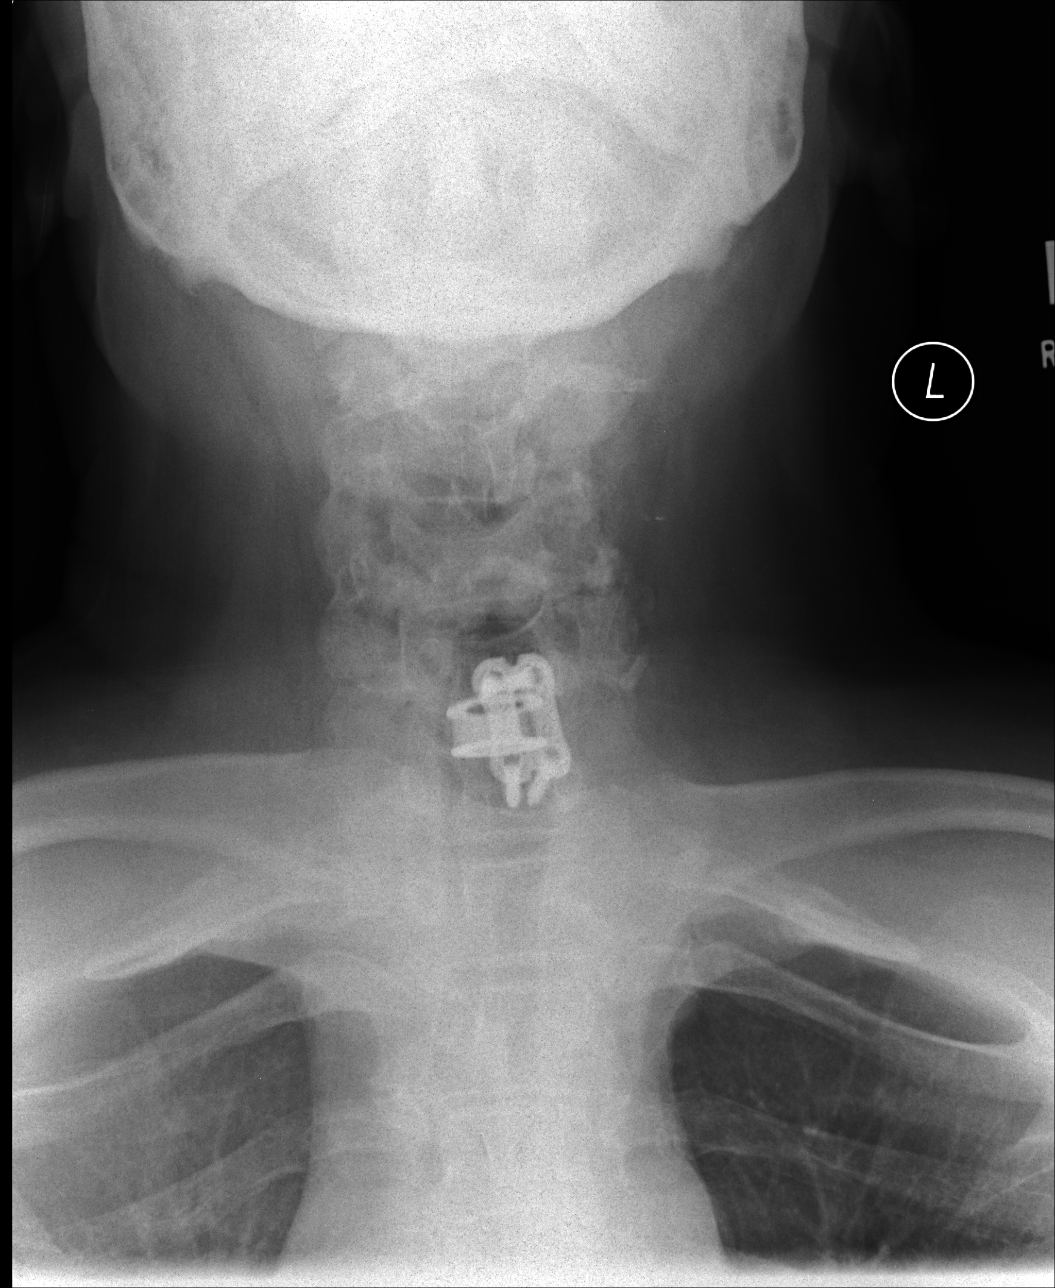

[3 of 3 positions shown; findings below may reference images not displayed]

FINDINGS: AP lateral and swimmer's views were obtained. C7 and T1 were not
optimally visualized on the lateral or swimmer's image.

Prior C6-7 ACDF. Straightening of the usual lordosis. Moderate disc
space narrowing and endplate hypertrophic changes at C4-5. Moderate
disc space narrowing at C5-6 with a large anterior osteophyte
arising from the inferior endplate of C5. Prevertebral soft tissues
unremarkable.
IMPRESSION: Straightening of the usual lordosis which may reflect positioning
and/or spasm. Moderate degenerative disc disease at C4-5 and C5-6.
Prior C6-7 ACDF.

## 2016-03-26 ENCOUNTER — Other Ambulatory Visit: Payer: Self-pay | Admitting: Family Medicine

## 2016-03-26 NOTE — Telephone Encounter (Signed)
See last prescription on September 1st. Advised patient then needs office visit for additional refills. Will prescribe 2 week refill to allow him to be seen.

## 2016-03-26 NOTE — Telephone Encounter (Signed)
05/2015 last ov and labs

## 2016-03-27 ENCOUNTER — Other Ambulatory Visit: Payer: Self-pay | Admitting: Family Medicine

## 2016-03-31 NOTE — Telephone Encounter (Signed)
Spoke to pt to notify her needs OV for more RFs and transferred him to scheduling for appt.

## 2016-04-02 ENCOUNTER — Ambulatory Visit (INDEPENDENT_AMBULATORY_CARE_PROVIDER_SITE_OTHER): Payer: Federal, State, Local not specified - PPO | Admitting: Family Medicine

## 2016-04-02 VITALS — BP 120/80 | HR 96 | Temp 97.7°F | Resp 17 | Ht 74.0 in | Wt 277.0 lb

## 2016-04-02 DIAGNOSIS — R350 Frequency of micturition: Secondary | ICD-10-CM

## 2016-04-02 DIAGNOSIS — J3089 Other allergic rhinitis: Secondary | ICD-10-CM

## 2016-04-02 DIAGNOSIS — Z125 Encounter for screening for malignant neoplasm of prostate: Secondary | ICD-10-CM

## 2016-04-02 DIAGNOSIS — Z8042 Family history of malignant neoplasm of prostate: Secondary | ICD-10-CM | POA: Diagnosis not present

## 2016-04-02 DIAGNOSIS — Z23 Encounter for immunization: Secondary | ICD-10-CM | POA: Diagnosis not present

## 2016-04-02 LAB — PSA: PSA: 1.5 ng/mL (ref <=4.0)

## 2016-04-02 MED ORDER — TAMSULOSIN HCL 0.4 MG PO CAPS: 0.4000 mg | ORAL_CAPSULE | Freq: Every day | ORAL | 0 refills | Status: DC

## 2016-04-02 NOTE — Patient Instructions (Addendum)
  You can try stopping the tamsulosin to see if you need it. If urinary frequency, hesitancy or trouble initiating urination, or waking up throughout the night to urinate returns, restart that medication. I sent in a 3 month supply IF needed.   I will check your PSA blood test to make sure that has not increased with your family history of prostate cancer. Please schedule a physical in next month or two for other cancer screening discussions and can test for diabetes at that time.  Return to the clinic or go to the nearest emergency room if any of your symptoms worsen or new symptoms occur.    IF you received an x-ray today, you will receive an invoice from [facility]. Please contact [facility] at [phone] with questions or concerns regarding your invoice.   IF you received labwork today, you will receive an invoice from [employer]. Please contact [facility] at [phone] with questions or concerns regarding your invoice.   Our billing staff will not be able to assist you with questions regarding bills from these companies.  You will be contacted with the lab results as soon as they are available. The fastest way to get your results is to activate your My Chart account. Instructions are located on the last page of this paperwork. If you have not heard from [place] regarding the results in 2 weeks, please contact this office.

## 2016-04-02 NOTE — Progress Notes (Signed)
Subjective:  By signing my name below, I, Moises Blood, attest that this documentation has been prepared under the direction and in the presence of Merri Ray, MD. Electronically Signed: Moises Blood, [place]. 04/02/2016 , 10:57 AM .  Patient was seen in Room 10 .   Patient ID: Chad Knapp, male    DOB: December 29, 1950, 65 y.o.   MRN: [id number] Chief Complaint  Patient presents with  . Medication Refill    flomax   HPI Chad Knapp is a 65 y.o. male  Here for medication refill of flomax. He has a h/o kidney stones. He was seen by Dr. Elder Cyphers in Dec 2016. He had 4 kidney stones within 10 years. He was seen for a physical with Dr. Brigitte Pulse in March 2016. He was seen by Jaynee Eagles in Nov 2016 and was treated for possible kidney stones with flomax 0.4mg  qd at that time. He has family history of prostate cancer in his father, at mid 26s years old (recently passed away at age 51).   Lab Results  Component Value Date   PSA 1.25 09/08/2014   He mentions occasional urinary urgency. He denies nocturia.   He was in the [employer] in 1973-1976 at [place].   Patient Active Problem List   Diagnosis Date Noted  . HEMORRHOIDS, INTERNAL 07/15/2007  . RHINITIS 07/15/2007  . GASTRITIS 07/15/2007  . DUODENITIS WITHOUT MENTION OF HEMORRHAGE 07/15/2007  . DIVERTICULOSIS OF COLON 07/15/2007  . ARTHRITIS 07/15/2007  . COLONIC POLYPS, HYPERPLASTIC, HX OF 07/15/2007   Past Medical History:  Diagnosis Date  . Anxiety   . Arthritis   . Cancer ([place])    basal cell -removed  . Depression   . Hx of transfusion of packed red blood cells 1974  . Shortness of breath   . Sleep apnea    cpap   Past Surgical History:  Procedure Laterality Date  . ANTERIOR CERVICAL DECOMP/DISCECTOMY FUSION N/A 02/03/2013   Procedure: ACDF C6 - 7     1 LEVEL;  Surgeon: Melina Schools, MD;  Location: [facility];  Service: Orthopedics;  Laterality: N/A;  . APPENDECTOMY    . BASAL CELL CARCINOMA EXCISION    .  CERVICAL SPINE SURGERY  02/03/2013   Dr Rolena Infante  . FRACTURE SURGERY Left    tib/fib/femur  . TESTICLE REMOVAL Left   . TONSILLECTOMY     No Known Allergies Prior to Admission medications   Medication Sig Start Date End Date Taking? Authorizing Provider  albuterol (PROVENTIL HFA;VENTOLIN HFA) 108 (90 BASE) MCG/ACT inhaler Inhale 2 puffs into the lungs every 4 (four) hours as needed for wheezing or shortness of breath.   Yes Historical Provider, MD  aspirin 81 MG tablet Take 81 mg by mouth daily.   Yes Historical Provider, MD  escitalopram (LEXAPRO) 20 MG tablet Take 20 mg by mouth daily.   Yes Historical Provider, MD  fluticasone (FLONASE) 50 MCG/ACT nasal spray Place 2 sprays into the nose daily as needed for rhinitis or allergies.   Yes Historical Provider, MD  HYDROcodone-acetaminophen (NORCO) 5-325 MG tablet Take 1 tablet by mouth every 6 (six) hours as needed. 06/12/15  Yes Jaynee Eagles, PA-C  methocarbamol (ROBAXIN) 500 MG tablet Take 1 tablet (500 mg total) by mouth 3 (three) times daily as needed. 05/14/15  Yes Jaynee Eagles, PA-C  metroNIDAZOLE (FLAGYL) 250 MG tablet Take 1 tablet (250 mg total) by mouth 3 (three) times daily. 06/04/15  Yes Orma Flaming, MD  Multiple  Vitamin (MULTIVITAMIN WITH MINERALS) TABS tablet Take 1 tablet by mouth daily.   Yes Historical Provider, MD  naproxen (NAPROSYN) 500 MG tablet Take 500 mg by mouth 2 (two) times daily with a meal.   Yes Historical Provider, MD  tamsulosin (FLOMAX) 0.4 MG CAPS capsule TAKE ONE CAPSULE BY MOUTH EVERY DAY 03/26/16  Yes Wendie Agreste, MD  valACYclovir (VALTREX) 1000 MG tablet Take 1 tablet (1,000 mg total) by mouth 3 (three) times daily. 06/12/15  Yes Jaynee Eagles, PA-C  traMADol (ULTRAM) 50 MG tablet Take 1 tablet (50 mg total) by mouth every 8 (eight) hours as needed. Patient not taking: Reported on 04/02/2016 05/14/15   Jaynee Eagles, PA-C   Social History   Social History  . Marital status: Married    Spouse name: N/A  .  Number of children: N/A  . Years of education: N/A   Occupational History  . Not on file.   Social History Main Topics  . Smoking status: Former Smoker    Quit date: 06/20/2007  . Smokeless tobacco: Never Used  . Alcohol use 0.0 oz/week     Comment: socially  . Drug use: No  . Sexual activity: Yes   Other Topics Concern  . Not on file   Social History Narrative  . No narrative on file   Review of Systems  Constitutional: Negative for fatigue and unexpected weight change.  Eyes: Negative for visual disturbance.  Respiratory: Negative for cough, chest tightness and shortness of breath.   Cardiovascular: Negative for chest pain, palpitations and leg swelling.  Gastrointestinal: Negative for abdominal pain and blood in stool.  Genitourinary: Positive for urgency.  Neurological: Negative for dizziness, light-headedness and headaches.       Objective:   Physical Exam  Constitutional: He is oriented to person, place, and time. He appears well-developed and well-nourished. No distress.  HENT:  Head: Normocephalic and atraumatic.  Eyes: EOM are normal. Pupils are equal, round, and reactive to light.  Neck: Neck supple.  Cardiovascular: Normal rate, regular rhythm and normal heart sounds.  Exam reveals no gallop and no friction rub.   No murmur heard. Pulmonary/Chest: Effort normal and breath sounds normal. No respiratory distress.  Genitourinary: Prostate normal.  Genitourinary Comments: No prostate tenderness, no prostate enlargement or nodules, somewhat difficult exam due to body habitus  Musculoskeletal: Normal range of motion.  Neurological: He is alert and oriented to person, place, and time.  Skin: Skin is warm and dry.  Psychiatric: He has a normal mood and affect. His behavior is normal.  Nursing note and vitals reviewed.   Vitals:   04/02/16 1024  BP: 120/80  Pulse: 96  Resp: 17  Temp: 97.7 F (36.5 C)  TempSrc: Oral  SpO2: 96%  Weight: 277 lb (125.6 kg)    Height: 6\' 2"  (1.88 m)      Assessment & Plan:   Chad Knapp is a 65 y.o. male Urinary frequency - Plan: tamsulosin (FLOMAX) 0.4 MG CAPS capsule, PSA  - Her symptoms, and may not be changed from taking Flomax. It appears this was initially used at the time of a kidney stone, then was continued. He may have a component of BPH with his frequency/hesitancy, but has had some dry mouth on occasion. Advised to try stopping the Flomax, then if urinary symptoms return, restart that medication.  Screening for prostate cancer - Plan: PSA Family history of prostate cancer in father - Plan: PSA  - No apparent nodules or enlargement  on exam, but somewhat difficult exam due to body habitus. Will check PSA, then consider urology eval if elevated.  Allergic rhinitis due to other allergic trigger, unspecified chronicity, unspecified seasonality  - stable with Flonase, no changes.  Meds ordered this encounter  Medications  . tamsulosin (FLOMAX) 0.4 MG CAPS capsule    Sig: Take 1 capsule (0.4 mg total) by mouth daily.    Dispense:  90 capsule    Refill:  0   Patient Instructions    You can try stopping the tamsulosin to see if you need it. If urinary frequency, hesitancy or trouble initiating urination, or waking up throughout the night to urinate returns, restart that medication. I sent in a 3 month supply IF needed.   I will check your PSA blood test to make sure that has not increased with your family history of prostate cancer. Please schedule a physical in next month or two for other cancer screening discussions and can test for diabetes at that time.  Return to the clinic or go to the nearest emergency room if any of your symptoms worsen or new symptoms occur.    IF you received an x-ray today, you will receive an invoice from [facility]. Please contact [facility] at [phone] with questions or concerns regarding your invoice.   IF you received labwork today, you  will receive an invoice from [employer]. Please contact [facility] at [phone] with questions or concerns regarding your invoice.   Our billing staff will not be able to assist you with questions regarding bills from these companies.  You will be contacted with the lab results as soon as they are available. The fastest way to get your results is to activate your My Chart account. Instructions are located on the last page of this paperwork. If you have not heard from [place] regarding the results in 2 weeks, please contact this office.        I personally performed the services described in this documentation, which was scribed in my presence. The recorded information has been reviewed and considered, and addended by me as needed.   Signed,   Merri Ray, MD [facility].  04/02/16 11:08 AM

## 2016-06-19 ENCOUNTER — Encounter: Payer: Federal, State, Local not specified - PPO | Admitting: Family Medicine

## 2016-07-03 ENCOUNTER — Encounter: Payer: Self-pay | Admitting: Family Medicine

## 2016-07-03 ENCOUNTER — Ambulatory Visit (INDEPENDENT_AMBULATORY_CARE_PROVIDER_SITE_OTHER): Payer: Federal, State, Local not specified - PPO | Admitting: Family Medicine

## 2016-07-03 VITALS — BP 130/81 | HR 70 | Temp 97.8°F | Resp 16 | Ht 74.0 in | Wt 282.0 lb

## 2016-07-03 DIAGNOSIS — Z13220 Encounter for screening for lipoid disorders: Secondary | ICD-10-CM

## 2016-07-03 DIAGNOSIS — Z0000 Encounter for general adult medical examination without abnormal findings: Secondary | ICD-10-CM | POA: Diagnosis not present

## 2016-07-03 DIAGNOSIS — F32A Depression, unspecified: Secondary | ICD-10-CM

## 2016-07-03 DIAGNOSIS — R0600 Dyspnea, unspecified: Secondary | ICD-10-CM

## 2016-07-03 DIAGNOSIS — Z1329 Encounter for screening for other suspected endocrine disorder: Secondary | ICD-10-CM

## 2016-07-03 DIAGNOSIS — R233 Spontaneous ecchymoses: Secondary | ICD-10-CM

## 2016-07-03 DIAGNOSIS — R238 Other skin changes: Secondary | ICD-10-CM

## 2016-07-03 DIAGNOSIS — F329 Major depressive disorder, single episode, unspecified: Secondary | ICD-10-CM

## 2016-07-03 MED ORDER — BECLOMETHASONE DIPROPIONATE 40 MCG/ACT IN AERS: 1.0000 | INHALATION_SPRAY | Freq: Two times a day (BID) | RESPIRATORY_TRACT | 3 refills | Status: DC

## 2016-07-03 NOTE — Patient Instructions (Addendum)
Schedule appointment with mental health provider to discuss depression symptoms. If any return of suicidal thoughts - be seen by mental health provider or emergency room right away. No change in Lexapro dose for now.   Continue follow up with ENT for your hearing loss., and dermatologist with history of skin cancer.   If you were diagnosed with [facility] ([facility] then you should start the Qvar for better control to lessen need for albuterol inhaler. If they diagnosed COPD, then a different medicine such as Spiriva may be a better recommendation). You can still use albuterol if needed for breakthrough symptoms.   I recommend tylenol initially for arthritis pain as long term use of nsaids like naprosyn is associated with heart disease.   Check to see when your last colonoscopy was performed and to see if you are due.   For pneumonia vaccination, let us know what you had and when.  Should have had both Pneumovax (PCV 23) and Prevnar (PCV 13) since turning 64. If not, have those given at [facility] or here.     Keeping you healthy  Get these tests  Blood pressure- Have your blood pressure checked once a year by your healthcare provider.  Normal blood pressure is 120/80  Weight- Have your body mass index (BMI) calculated to screen for obesity.  BMI is a measure of body fat based on height and weight. You can also calculate your own BMI at [employer].  Cholesterol- Have your cholesterol checked every year.  Diabetes- Have your blood sugar checked regularly if you have high blood pressure, high cholesterol, have a family history of diabetes or if you are overweight.  Screening for Colon Cancer- Colonoscopy starting at age 39.  Screening may begin sooner depending on your family history and other health conditions. Follow up colonoscopy as directed by your Gastroenterologist.  Screening for Prostate Cancer- Both blood work (PSA) and a rectal exam help screen for Prostate  Cancer.  Screening begins at age 25 with African-American men and at age 52 with Caucasian men.  Screening may begin sooner depending on your family history.  Take these medicines  Aspirin- One aspirin daily can help prevent Heart disease and Stroke.  Flu shot- Every fall.  Tetanus- Every 10 years.  Zostavax- Once after the age of 64 to prevent Shingles.  Pneumonia shot- Once after the age of 75; if you are younger than 48, ask your healthcare provider if you need a Pneumonia shot.  Take these steps  Don't smoke- If you do smoke, talk to your doctor about quitting.  For tips on how to quit, go to www.smokefree.gov or call 1-800-QUIT-NOW.  Be physically active- Exercise 5 days a week for at least 30 minutes.  If you are not already physically active start slow and gradually work up to 30 minutes of moderate physical activity.  Examples of moderate activity include walking briskly, mowing the yard, dancing, swimming, bicycling, etc.  Eat a healthy diet- Eat a variety of healthy food such as fruits, vegetables, low fat milk, low fat cheese, yogurt, lean meant, poultry, fish, beans, tofu, etc. For more information go to www.thenutritionsource.org  Drink alcohol in moderation- Limit alcohol intake to less than two drinks a day. Never drink and drive.  Dentist- Brush and floss twice daily; visit your dentist twice a year.  Depression- Your emotional health is as important as your physical health. If you're feeling down, or losing interest in things you would normally enjoy please talk to  your healthcare provider.  Eye exam- Visit your eye doctor every year.  Safe sex- If you may be exposed to a sexually transmitted infection, use a condom.  Seat belts- Seat belts can save your life; always wear one.  Smoke/Carbon Monoxide detectors- These detectors need to be installed on the appropriate level of your home.  Replace batteries at least once a year.  Skin cancer- When out in the sun,  cover up and use sunscreen 15 SPF or higher.  Violence- If anyone is threatening you, please tell your healthcare provider.  Living Will/ Health care power of attorney- Speak with your healthcare provider and family.   IF you received an x-ray today, you will receive an invoice from [facility]. Please contact [facility] at [phone] with questions or concerns regarding your invoice.   IF you received labwork today, you will receive an invoice from [facility]. Please contact [facility] at [phone] with questions or concerns regarding your invoice.   Our billing staff will not be able to assist you with questions regarding bills from these companies.  You will be contacted with the lab results as soon as they are available. The fastest way to get your results is to activate your My Chart account. Instructions are located on the last page of this paperwork. If you have not heard from [place] regarding the results in 2 weeks, please contact this office.

## 2016-07-03 NOTE — Progress Notes (Signed)
By signing my name below I, Tereasa Coop, attest that this documentation has been prepared under the direction and in the presence of Wendie Agreste, MD. Electonically Signed. Izael Gramza, Scribe 07/03/2016 at 11:23 AM  Subjective:    Patient ID: Chad Knapp, male    DOB: 05/09/1951, 66 y.o.   MRN: [id number]  Chief Complaint  Patient presents with  . Annual Exam    HPI Chad Knapp is a 66 y.o. male who presents to the [facility] for annual physical. Pt has a history of depression with anxiety, kidney stones and urinary frequency, diverticulosis, basal cell cancer, OSA on CPAP. Pt also expressing concern for dementia because pt's mother developed dementia. Pt is reporting loss of concentration, stating memory is fine in the morning and memory becomes mildly fuzzy in the evening.    Pt reports mild SOB with exertion and uses albuterol inhaler every day to every other day with good relief. First c/o SOB 5 years ago and was evaluated by cardiology who ruled out cardiac etiology at that time. Pt has history of smoking and quit in 2008. Has been evaluated by pulmonology and pt states he was only told that he "would never gain back full lung capacity".   Arthritis Pt takes naprosyn for arthritic pain.   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 Has hearing aid in rt ear.   Depression with anxiety Takes lexapro 20mg  QD. Denies feeling down or depressed. Pt states that he still has some mild anxiety. Pt is seen by psychologist for anxiety. Pt reports very rare, fleeting thoughts of suicide. Last suicidal thought was 4 months ago. Pt denies ever having plan or intent. Pt is a gun owner and has guns at home. Pt states his father passed away this past year.   History of Kidney stones and urinary frequency Takes 0.4mg  flomax QD. Suspected component of BPH. Did have some dry mouth with flomax, discussed  option for stopping medication at last visit and plan to restart if urinary symptoms returned.  Lab Results  Component Value Date   PSA 1.5 04/02/2016   PSA 1.25 09/08/2014    Colon cancer screening Pt's last colonoscopy was in 2007 and was performed by Dr Fuller Plan. Hyperplastic polyps at that time. Pt states he thinks he had a colonoscopy performed sometime between 2011-2013. Pt unsure when. Upon reviewing pt's chart, pt had a colonoscopy in 2013 with [facility].  Pt states he thinks he was told to follow up in 7 years.   Prostate cancer screening Lab Results  Component Value Date   PSA 1.5 04/02/2016   PSA 1.25 09/08/2014   Skin cancer screening History of basal cell carcinoma. Pt followed by Dermatology every 6 months through the [place].   Immunizations  Immunization History  Administered Date(s) Administered  . Influenza,inj,Quad PF,36+ Mos 04/02/2016  . Influenza-Unspecified 02/21/2014  . Tdap 09/08/2014  Pt states he had shingles vaccine 1-2 years ago. Pt also reports that he thinks he had a pneumonia vaccine when he turned 72 and a second one when he turned 88.   Depression screening Depression screen [facility] 2/9 07/03/2016 04/02/2016 06/12/2015 06/06/2015 06/04/2015  Decreased Interest 0 0 0 0 0  Down, Depressed, Hopeless 0 0 0 0 0  PHQ - 2 Score 0 0 0 0 0   Fall screening No falls in the past year.  Functional Status Survey: Is  the patient deaf or have difficulty hearing?: No Does the patient have difficulty seeing, even when wearing glasses/contacts?: No Does the patient have difficulty concentrating, remembering, or making decisions?: No Does the patient have difficulty walking or climbing stairs?: No Does the patient have difficulty dressing or bathing?: No Does the patient have difficulty doing errands alone such as visiting a doctor's office or shopping?: No  Vision screening  Visual Acuity Screening   Right eye Left eye Both eyes  Without correction: 20/25 20/20-1 20/13   With correction:     Last seen by eye doctor in January of 2017.   Dentist Pt states he sees his dentist every 6 months.   Exercise Pt walks occasionally. Pt states that [facility].   Advanced directives Pt states that he has a living will and states he will bring a copy to the office.     Patient Active Problem List   Diagnosis Date Noted  . HEMORRHOIDS, INTERNAL 07/15/2007  . RHINITIS 07/15/2007  . GASTRITIS 07/15/2007  . DUODENITIS WITHOUT MENTION OF HEMORRHAGE 07/15/2007  . DIVERTICULOSIS OF COLON 07/15/2007  . ARTHRITIS 07/15/2007  . COLONIC POLYPS, HYPERPLASTIC, HX OF 07/15/2007   Past Medical History:  Diagnosis Date  . Anxiety   . Arthritis   . Cancer ([place])    basal cell -removed  . Depression   . Hx of transfusion of packed red blood cells 1974  . Shortness of breath   . Sleep apnea    cpap   Past Surgical History:  Procedure Laterality Date  . ANTERIOR CERVICAL DECOMP/DISCECTOMY FUSION N/A 02/03/2013   Procedure: ACDF C6 - 7     1 LEVEL;  Surgeon: Melina Schools, MD;  Location: [place];  Service: Orthopedics;  Laterality: N/A;  . APPENDECTOMY    . BASAL CELL CARCINOMA EXCISION    . CERVICAL SPINE SURGERY  02/03/2013   Dr Rolena Infante  . FRACTURE SURGERY Left    tib/fib/femur  . TESTICLE REMOVAL Left   . TONSILLECTOMY     No Known Allergies Prior to Admission medications   Medication Sig Start Date End Date Taking? Authorizing Provider  albuterol (PROVENTIL HFA;VENTOLIN HFA) 108 (90 BASE) MCG/ACT inhaler Inhale 2 puffs into the lungs every 4 (four) hours as needed for wheezing or shortness of breath.   Yes Historical Provider, MD  aspirin 81 MG tablet Take 81 mg by mouth daily.   Yes Historical Provider, MD  escitalopram (LEXAPRO) 20 MG tablet Take 20 mg by mouth daily.   Yes Historical Provider, MD  fluticasone (FLONASE) 50 MCG/ACT nasal spray Place 2 sprays into the nose daily as needed for  rhinitis or allergies.   Yes Historical Provider, MD  Multiple Vitamin (MULTIVITAMIN WITH MINERALS) TABS tablet Take 1 tablet by mouth daily.   Yes Historical Provider, MD  naproxen (NAPROSYN) 500 MG tablet Take 500 mg by mouth 2 (two) times daily with a meal.   Yes Historical Provider, MD  HYDROcodone-acetaminophen (NORCO) 5-325 MG tablet Take 1 tablet by mouth every 6 (six) hours as needed. Patient not taking: Reported on 07/03/2016 06/12/15   Jaynee Eagles, PA-C  methocarbamol (ROBAXIN) 500 MG tablet Take 1 tablet (500 mg total) by mouth 3 (three) times daily as needed. Patient not taking: Reported on 07/03/2016 05/14/15   Jaynee Eagles, PA-C  metroNIDAZOLE (FLAGYL) 250 MG tablet Take 1 tablet (250 mg total) by mouth 3 (three) times daily. Patient not  taking: Reported on 07/03/2016 06/04/15   Orma Flaming, MD  tamsulosin (FLOMAX) 0.4 MG CAPS capsule Take 1 capsule (0.4 mg total) by mouth daily. Patient not taking: Reported on 07/03/2016 04/02/16   Wendie Agreste, MD  traMADol (ULTRAM) 50 MG tablet Take 1 tablet (50 mg total) by mouth every 8 (eight) hours as needed. Patient not taking: Reported on 07/03/2016 05/14/15   Jaynee Eagles, PA-C  valACYclovir (VALTREX) 1000 MG tablet Take 1 tablet (1,000 mg total) by mouth 3 (three) times daily. Patient not taking: Reported on 07/03/2016 06/12/15   Jaynee Eagles, PA-C   Social History   Social History  . Marital status: Married    Spouse name: N/A  . Number of children: N/A  . Years of education: N/A   Occupational History  . Not on file.   Social History Main Topics  . Smoking status: Former Smoker    Quit date: 06/20/2007  . Smokeless tobacco: Never Used  . Alcohol use 0.0 oz/week     Comment: socially  . Drug use: No  . Sexual activity: Yes   Other Topics Concern  . Not on file   Social History Narrative  . No narrative on file      Review of Systems 13 point ROS reviewed and negative except POSITIVE for SOB, arthralgias, anxiety, easy  bruising from lexapro, fleeting suicidal thoughts (none currently or in the past 4 months), and rt ear hearing loss, all as discussed above in the HPI.    Objective:   Physical Exam  Constitutional: He is oriented to person, place, and time. He appears well-developed and well-nourished.  HENT:  Head: Normocephalic and atraumatic.  Right Ear: External ear normal.  Left Ear: External ear normal.  Mouth/Throat: Oropharynx is clear and moist.  Eyes: Conjunctivae and EOM are normal. Pupils are equal, round, and reactive to light.  Neck: Normal range of motion. Neck supple. No thyromegaly present.  Cardiovascular: Normal rate, regular rhythm, normal heart sounds and intact distal pulses.   Pulmonary/Chest: Effort normal and breath sounds normal. No respiratory distress. He has no wheezes.  Abdominal: Soft. He exhibits no distension. There is no tenderness.  Musculoskeletal: Normal range of motion. He exhibits no edema or tenderness.  Lymphadenopathy:    He has no cervical adenopathy.  Neurological: He is alert and oriented to person, place, and time. He has normal reflexes.  Skin: Skin is warm and dry.  Psychiatric: He has a normal mood and affect. His behavior is normal.  Vitals reviewed.  overweight.   Vitals:   07/03/16 1024  BP: 130/81  Pulse: 70  Resp: 16  Temp: 97.8 F (36.6 C)  TempSrc: Oral  SpO2: 98%  Weight: 282 lb (127.9 kg)  Height: 6\' 2"  (1.88 m)         Assessment & Plan:   Chad Knapp is a 66 y.o. male Annual physical exam - Plan: Care order/instruction:  --anticipatory guidance as below in AVS, screening labs above. Health maintenance items as above in HPI discussed/recommended as applicable. Advised to check into last colonoscopy and PNA vaccination through [place]. Recommendations discussed.    Dyspnea, unspecified type - Plan: beclomethasone (QVAR) 40 MCG/ACT inhaler  - Based on albuterol use, recommended starting Qvar as inhaled corticosteroid. However  [facility] whether or not he has been diagnosed with asthma or COPD. If COPD, Spiriva may be better option. RTC precautions.   Screening for hyperlipidemia - Plan: Comprehensive metabolic panel, Lipid panel  -  Check lipids.cmp.   Depression, unspecified depression type - Plan: TSH  - recommended appt with mental health provider as improved control may be needed. ER precautions if suicidal thoughts.   Easy bruising - Plan: CBC  - check CBC. No significant bruising noted on exam.   Screening for thyroid disorder - Plan: TSH  Arthralgias, suspect arthritis. Tylenol discussed as initial treatment.  Continue follow-up with ENT and dermatology   Meds ordered this encounter  Medications  . beclomethasone (QVAR) 40 MCG/ACT inhaler    Sig: Inhale 1 puff into the lungs 2 (two) times daily.    Dispense:  1 Inhaler    Refill:  3   Patient Instructions   Schedule appointment with mental health provider to discuss depression symptoms. If any return of suicidal thoughts - be seen by mental health provider or emergency room right away. No change in Lexapro dose for now.   Continue follow up with ENT for your hearing loss., and dermatologist with history of skin cancer.   If you were diagnosed with asthma ([facility] this [facility]), then you should start the Qvar for better control to lessen need for albuterol inhaler. If they diagnosed COPD, then a different medicine such as Spiriva may be a better recommendation). You can still use albuterol if needed for breakthrough symptoms.   I recommend tylenol initially for arthritis pain as long term use of nsaids like naprosyn is associated with heart disease.   Check to see when your last colonoscopy was performed and to see if you are due.   For pneumonia vaccination, let us know what you had and when.  Should have had both Pneumovax (PCV 23) and Prevnar (PCV 13) since turning 64. If not, have those given at [facility] or here.     Keeping  you healthy  Get these tests  Blood pressure- Have your blood pressure checked once a year by your healthcare provider.  Normal blood pressure is 120/80  Weight- Have your body mass index (BMI) calculated to screen for obesity.  BMI is a measure of body fat based on height and weight. You can also calculate your own BMI at ViewBanking.si.  Cholesterol- Have your cholesterol checked every year.  Diabetes- Have your blood sugar checked regularly if you have high blood pressure, high cholesterol, have a family history of diabetes or if you are overweight.  Screening for Colon Cancer- Colonoscopy starting at age 2.  Screening may begin sooner depending on your family history and other health conditions. Follow up colonoscopy as directed by your Gastroenterologist.  Screening for Prostate Cancer- Both blood work (PSA) and a rectal exam help screen for Prostate Cancer.  Screening begins at age 74 with African-American men and at age 57 with Caucasian men.  Screening may begin sooner depending on your family history.  Take these medicines  Aspirin- One aspirin daily can help prevent Heart disease and Stroke.  Flu shot- Every fall.  Tetanus- Every 10 years.  Zostavax- Once after the age of 40 to prevent Shingles.  Pneumonia shot- Once after the age of 62; if you are younger than 68, ask your healthcare provider if you need a Pneumonia shot.  Take these steps  Don't smoke- If you do smoke, talk to your doctor about quitting.  For tips on how to quit, go to www.smokefree.gov or call 1-800-QUIT-NOW.  Be physically active- Exercise 5 days a week for at least 30 minutes.  If you are not already physically active start slow and  gradually work up to 30 minutes of moderate physical activity.  Examples of moderate activity include walking briskly, mowing the yard, dancing, swimming, bicycling, etc.  Eat a healthy diet- Eat a variety of healthy food such as fruits, vegetables, low fat  milk, low fat cheese, yogurt, lean meant, poultry, fish, beans, tofu, etc. For more information go to www.thenutritionsource.org  Drink alcohol in moderation- Limit alcohol intake to less than two drinks a day. Never drink and drive.  Dentist- Brush and floss twice daily; visit your dentist twice a year.  Depression- Your emotional health is as important as your physical health. If you're feeling down, or losing interest in things you would normally enjoy please talk to your healthcare provider.  Eye exam- Visit your eye doctor every year.  Safe sex- If you may be exposed to a sexually transmitted infection, use a condom.  Seat belts- Seat belts can save your life; always wear one.  Smoke/Carbon Monoxide detectors- These detectors need to be installed on the appropriate level of your home.  Replace batteries at least once a year.  Skin cancer- When out in the sun, cover up and use sunscreen 15 SPF or higher.  Violence- If anyone is threatening you, please tell your healthcare provider.  Living Will/ Health care power of attorney- Speak with your healthcare provider and family.   IF you received an x-ray today, you will receive an invoice from [facility]. Please contact [facility] at [phone] with questions or concerns regarding your invoice.   IF you received labwork today, you will receive an invoice from [facility]. Please contact [facility] at [phone] with questions or concerns regarding your invoice.   Our billing staff will not be able to assist you with questions regarding bills from these companies.  You will be contacted with the lab results as soon as they are available. The fastest way to get your results is to activate your My Chart account. Instructions are located on the last page of this paperwork. If you have not heard from [place] regarding the results in 2 weeks, please contact this office.         I personally performed the services described  in this documentation, which was scribed in my presence. The recorded information has been reviewed and considered, and addended by me as needed.   Signed,   Merri Ray, MD [facility] [place].  07/06/16 12:59 PM

## 2016-07-04 LAB — COMPREHENSIVE METABOLIC PANEL
ALBUMIN: 4.1 g/dL (ref 3.6–4.8)
ALT: 24 IU/L (ref 0–44)
AST: 20 IU/L (ref 0–40)
Albumin/Globulin Ratio: 1.5 (ref 1.2–2.2)
Alkaline Phosphatase: 81 IU/L (ref 39–117)
BUN/Creatinine Ratio: 17 (ref 10–24)
BUN: 15 mg/dL (ref 8–27)
Bilirubin Total: 0.4 mg/dL (ref 0.0–1.2)
CALCIUM: 8.9 mg/dL (ref 8.6–10.2)
CO2: 20 mmol/L (ref 18–29)
CREATININE: 0.89 mg/dL (ref 0.76–1.27)
Chloride: 105 mmol/L (ref 96–106)
GFR calc non Af Amer: 89 mL/min/{1.73_m2} (ref >59)
GFR, EST AFRICAN AMERICAN: 103 mL/min/{1.73_m2} (ref >59)
GLUCOSE: 84 mg/dL (ref 65–99)
Globulin, Total: 2.8 g/dL (ref 1.5–4.5)
Potassium: 4.2 mmol/L (ref 3.5–5.2)
Sodium: 142 mmol/L (ref 134–144)
TOTAL PROTEIN: 6.9 g/dL (ref 6.0–8.5)

## 2016-07-04 LAB — LIPID PANEL
Chol/HDL Ratio: 4.3 ratio units (ref 0.0–5.0)
Cholesterol, Total: 194 mg/dL (ref 100–199)
HDL: 45 mg/dL (ref >39)
LDL Calculated: 124 mg/dL — ABNORMAL HIGH (ref 0–99)
Triglycerides: 127 mg/dL (ref 0–149)
VLDL CHOLESTEROL CAL: 25 mg/dL (ref 5–40)

## 2016-07-04 LAB — CBC
Hematocrit: 46.5 % (ref 37.5–51.0)
Hemoglobin: 15.9 g/dL (ref 13.0–17.7)
MCH: 30.9 pg (ref 26.6–33.0)
MCHC: 34.2 g/dL (ref 31.5–35.7)
MCV: 91 fL (ref 79–97)
PLATELETS: 149 10*3/uL — AB (ref 150–379)
RBC: 5.14 x10E6/uL (ref 4.14–5.80)
RDW: 13.7 % (ref 12.3–15.4)
WBC: 7.2 10*3/uL (ref 3.4–10.8)

## 2016-07-04 LAB — TSH: TSH: 2.52 u[IU]/mL (ref 0.450–4.500)

## 2016-09-02 ENCOUNTER — Other Ambulatory Visit: Payer: Self-pay | Admitting: *Deleted

## 2016-09-02 DIAGNOSIS — R0600 Dyspnea, unspecified: Secondary | ICD-10-CM

## 2016-09-02 MED ORDER — BECLOMETHASONE DIPROP HFA 40 MCG/ACT IN AERB: 1.0000 | INHALATION_SPRAY | Freq: Two times a day (BID) | RESPIRATORY_TRACT | 3 refills | Status: DC

## 2016-11-03 DIAGNOSIS — F32A Depression, unspecified: Secondary | ICD-10-CM | POA: Insufficient documentation

## 2017-11-17 ENCOUNTER — Encounter: Payer: Self-pay | Admitting: Family Medicine

## 2019-06-13 ENCOUNTER — Other Ambulatory Visit: Payer: Self-pay

## 2019-06-13 ENCOUNTER — Encounter (HOSPITAL_COMMUNITY): Payer: Self-pay | Admitting: Emergency Medicine

## 2019-06-13 ENCOUNTER — Emergency Department (HOSPITAL_COMMUNITY)
Admission: EM | Admit: 2019-06-13 | Discharge: 2019-06-14 | Disposition: A | Discharge status: Home / Self Care | Payer: Federal, State, Local not specified - PPO | Attending: Emergency Medicine | Admitting: Emergency Medicine

## 2019-06-13 ENCOUNTER — Emergency Department (HOSPITAL_COMMUNITY): Payer: Federal, State, Local not specified - PPO

## 2019-06-13 DIAGNOSIS — R55 Syncope and collapse: Secondary | ICD-10-CM | POA: Insufficient documentation

## 2019-06-13 DIAGNOSIS — Z85828 Personal history of other malignant neoplasm of skin: Secondary | ICD-10-CM | POA: Insufficient documentation

## 2019-06-13 DIAGNOSIS — F1290 Cannabis use, unspecified, uncomplicated: Secondary | ICD-10-CM | POA: Insufficient documentation

## 2019-06-13 DIAGNOSIS — Z87891 Personal history of nicotine dependence: Secondary | ICD-10-CM | POA: Diagnosis not present

## 2019-06-13 LAB — CBC WITH DIFFERENTIAL/PLATELET
Abs Immature Granulocytes: 0.05 10*3/uL (ref 0.00–0.07)
Basophils Absolute: 0.1 10*3/uL (ref 0.0–0.1)
Basophils Relative: 1 %
Eosinophils Absolute: 0.4 10*3/uL (ref 0.0–0.5)
Eosinophils Relative: 4 %
HCT: 42.9 % (ref 39.0–52.0)
Hemoglobin: 14.2 g/dL (ref 13.0–17.0)
Immature Granulocytes: 1 %
Lymphocytes Relative: 30 %
Lymphs Abs: 2.5 10*3/uL (ref 0.7–4.0)
MCH: 28.5 pg (ref 26.0–34.0)
MCHC: 33.1 g/dL (ref 30.0–36.0)
MCV: 86.1 fL (ref 80.0–100.0)
Monocytes Absolute: 0.7 10*3/uL (ref 0.1–1.0)
Monocytes Relative: 8 %
Neutro Abs: 4.9 10*3/uL (ref 1.7–7.7)
Neutrophils Relative %: 56 %
Platelets: 191 10*3/uL (ref 150–400)
RBC: 4.98 MIL/uL (ref 4.22–5.81)
RDW: 14.4 % (ref 11.5–15.5)
WBC: 8.5 10*3/uL (ref 4.0–10.5)
nRBC: 0 % (ref 0.0–0.2)

## 2019-06-13 LAB — COMPREHENSIVE METABOLIC PANEL
ALT: 21 U/L (ref 0–44)
AST: 20 U/L (ref 15–41)
Albumin: 3.7 g/dL (ref 3.5–5.0)
Alkaline Phosphatase: 63 U/L (ref 38–126)
Anion gap: 11 (ref 5–15)
BUN: 13 mg/dL (ref 8–23)
CO2: 20 mmol/L — ABNORMAL LOW (ref 22–32)
Calcium: 8.7 mg/dL — ABNORMAL LOW (ref 8.9–10.3)
Chloride: 107 mmol/L (ref 98–111)
Creatinine, Ser: 1 mg/dL (ref 0.61–1.24)
GFR calc Af Amer: >60 mL/min (ref >60)
GFR calc non Af Amer: >60 mL/min (ref >60)
Glucose, Bld: 97 mg/dL (ref 70–99)
Potassium: 3.6 mmol/L (ref 3.5–5.1)
Sodium: 138 mmol/L (ref 135–145)
Total Bilirubin: 0.5 mg/dL (ref 0.3–1.2)
Total Protein: 6.9 g/dL (ref 6.5–8.1)

## 2019-06-13 LAB — ETHANOL: Alcohol, Ethyl (B): 11 mg/dL — ABNORMAL HIGH (ref <10)

## 2019-06-13 MED ORDER — SODIUM CHLORIDE 0.9 % IV BOLUS
1000.0000 mL | Freq: Once | INTRAVENOUS | Status: AC
  Administered 2019-06-14: 1000 mL via INTRAVENOUS

## 2019-06-13 NOTE — ED Provider Notes (Signed)
[facility] EMERGENCY DEPARTMENT Provider Note   CSN: [id number] Arrival date & time: 06/13/19  2050     History Chief Complaint  Patient presents with  . Syncope    ETOH/Marijuana    Chad Knapp is a 68 y.o. male.  HPI   Pt states he felt fine this evening.  He had grilled hamburgers and was having a few beers.  He also smoked some marijuana.  Pt was sitting in his chair and went to get up to get another beer.  Pt lost consciousness.  The next thing he  Remembers is  his wife asking him if he was OK.  Pt felt he had some trouble getting up initially so his wife told him to stay still and  911 was called and he was brought to the ED.  He did have a headache initially and some photophobia but otherwise no symptoms.  Pt denies any prior history of this.  No CP or SOB tonight ( he has some chronic issues).   No fevers.    No trouble moving arms or legs.  He does not have a headache now.  Past Medical History:  Diagnosis Date  . Anxiety   . Arthritis   . Cancer ([place])    basal cell -removed  . Depression   . Hx of transfusion of packed red blood cells 1974  . Shortness of breath   . Sleep apnea    cpap    Patient Active Problem List   Diagnosis Date Noted  . HEMORRHOIDS, INTERNAL 07/15/2007  . RHINITIS 07/15/2007  . GASTRITIS 07/15/2007  . DUODENITIS WITHOUT MENTION OF HEMORRHAGE 07/15/2007  . DIVERTICULOSIS OF COLON 07/15/2007  . ARTHRITIS 07/15/2007  . COLONIC POLYPS, HYPERPLASTIC, HX OF 07/15/2007    Past Surgical History:  Procedure Laterality Date  . ANTERIOR CERVICAL DECOMP/DISCECTOMY FUSION N/A 02/03/2013   Procedure: ACDF C6 - 7     1 LEVEL;  Surgeon: Melina Schools, MD;  Location: [place];  Service: Orthopedics;  Laterality: N/A;  . APPENDECTOMY    . BASAL CELL CARCINOMA EXCISION    . CERVICAL SPINE SURGERY  02/03/2013   Dr Rolena Infante  . FRACTURE SURGERY Left    tib/fib/femur  . TESTICLE REMOVAL Left   . TONSILLECTOMY         Family  History  Problem Relation Age of Onset  . Mental illness Mother     Social History   Tobacco Use  . Smoking status: Former Smoker    Quit date: 06/20/2007    Years since quitting: 11.9  . Smokeless tobacco: Never Used  Substance Use Topics  . Alcohol use: Yes    Comment: socially  . Drug use: Yes    Types: Marijuana    Home Medications Prior to Admission medications   Medication Sig Start Date End Date Taking? Authorizing Provider  albuterol (PROVENTIL HFA;VENTOLIN HFA) 108 (90 BASE) MCG/ACT inhaler Inhale 2 puffs into the lungs every 4 (four) hours as needed for wheezing or shortness of breath.    [provider]  aspirin 81 MG tablet Take 81 mg by mouth daily.    [provider]  beclomethasone (QVAR) 40 MCG/ACT inhaler Inhale 1 puff into the lungs 2 (two) times daily. 07/03/16   Wendie Agreste, MD  Beclomethasone Diprop HFA (QVAR REDIHALER) 40 MCG/ACT AERB Inhale 1 puff into the lungs 2 (two) times daily. 09/02/16   Wendie Agreste, MD  escitalopram (LEXAPRO) 20 MG tablet Take 20  mg by mouth daily.    [provider]  fluticasone (FLONASE) 50 MCG/ACT nasal spray Place 2 sprays into the nose daily as needed for rhinitis or allergies.    [provider]  Multiple Vitamin (MULTIVITAMIN WITH MINERALS) TABS tablet Take 1 tablet by mouth daily.    [provider]  naproxen (NAPROSYN) 500 MG tablet Take 500 mg by mouth 2 (two) times daily with a meal.    [provider]    Allergies    Patient has no known allergies.  Review of Systems   Review of Systems  Neurological: Positive for headaches (5/10, earlier, resolved now).  All other systems reviewed and are negative.   Physical Exam Updated Vital Signs BP 117/75 (BP Location: Right Arm)   Pulse 74   Temp 97.6 F (36.4 C) (Oral)   Resp 16   SpO2 93%   Physical Exam Vitals and nursing note reviewed.  Constitutional:      General: He is not in acute distress.     Appearance: He is well-developed.  HENT:     Head: Normocephalic and atraumatic.     Right Ear: External ear normal.     Left Ear: External ear normal.  Eyes:     General: No scleral icterus.       Right eye: No discharge.        Left eye: No discharge.     Conjunctiva/sclera: Conjunctivae normal.  Neck:     Trachea: No tracheal deviation.  Cardiovascular:     Rate and Rhythm: Normal rate and regular rhythm.  Pulmonary:     Effort: Pulmonary effort is normal. No respiratory distress.     Breath sounds: Normal breath sounds. No stridor. No wheezing or rales.  Abdominal:     General: Bowel sounds are normal. There is no distension.     Palpations: Abdomen is soft.     Tenderness: There is no abdominal tenderness. There is no guarding or rebound.  Musculoskeletal:        General: No tenderness.     Cervical back: Neck supple.  Skin:    General: Skin is warm and dry.     Findings: No rash.  Neurological:     Mental Status: He is alert.     Cranial Nerves: No cranial nerve deficit (no facial droop, extraocular movements intact, no slurred speech).     Sensory: No sensory deficit.     Motor: No abnormal muscle tone or seizure activity.     Coordination: Coordination normal.     Comments: Nl speech, 5/5 ue and le strength, normal sensation     ED Results / Procedures / Treatments   Labs (all labs ordered are listed, but only abnormal results are displayed) Labs Reviewed  COMPREHENSIVE METABOLIC PANEL - Abnormal; Notable for the following components:      Result Value   CO2 20 (*)    Calcium 8.7 (*)    All other components within normal limits  ETHANOL - Abnormal; Notable for the following components:   Alcohol, Ethyl (B) 11 (*)    All other components within normal limits  CBC WITH DIFFERENTIAL/PLATELET    EKG EKG Interpretation  Date/Time:  Monday June 13 2019 21:04:51 EST Ventricular Rate:  75 PR Interval:  172 QRS Duration: 92 QT Interval:  386 QTC  Calculation: 431 R Axis:   51 Text Interpretation: Normal sinus rhythm Normal ECG No old tracing to compare Confirmed by Delora Fuel ([id number]) on 06/13/2019  10:59:38 PM   Radiology No results found. (CT pending)  Procedures Procedures (including critical care time)  Medications Ordered in ED Medications  sodium chloride 0.9 % bolus 1,000 mL (has no administration in time range)    ED Course  I have reviewed the triage vital signs and the nursing notes.  Pertinent labs & imaging results that were available during my care of the patient were reviewed by me and considered in my medical decision making (see chart for details).    MDM Rules/Calculators/A&P                      Pt presents with a syncopal episode.  Appears well now in no distress.  Vitals and labs are reassuring.  EKG unremarkable.  Sx may be vasovagal in nature, partly related to the ETOH and marijuana.  Pt did have a headache and photophobia so we will get a head CT.  If negative, pt otherwise appears well, workup reassuring.  Will plan on outpt follow up Final Clinical Impression(s) / ED Diagnoses Final diagnoses:  Syncope, unspecified syncope type    Rx / DC Orders ED Discharge Orders    None       Dorie Rank, MD 06/13/19 2340

## 2019-06-13 NOTE — Discharge Instructions (Addendum)
Follow up with your doctor to be rechecked as we discussed.  Return to the ED if you have recurrent symptoms

## 2019-06-13 NOTE — ED Triage Notes (Signed)
Patient arrived with EMS from home reports brief syncopal episode at home after drinking ETOH/smoked marijuana this evening , alert and oriented x4 at arrival , he received Zofran 4 mg IV for nausea by EMS , CBG= 112 . Superficial skin laceration with minimal bleeding at lower forehead.

## 2019-06-13 NOTE — ED Provider Notes (Signed)
11:53 PM Assumed care from Dr. Tomi Bamberger, please see their note for full history, physical and decision making until this point. In brief this is a 68 y.o. year old male who presented to the ED tonight with Syncope (ETOH/Marijuana)     Syncope with headache a couple hours ago. Pending CT and reeval.   CT ok.   Talked to patient. Not slurring words. Ambulates without difficulty. Eats without difficulty. No symptoms. Seems clinically sober and stable for dc w/ wife.  Discharge instructions, including strict return precautions for new or worsening symptoms, given. Patient and/or family verbalized understanding and agreement with the plan as described.   Labs, studies and imaging reviewed by myself and considered in medical decision making if ordered. Imaging interpreted by radiology.  Labs Reviewed  COMPREHENSIVE METABOLIC PANEL - Abnormal; Notable for the following components:      Result Value   CO2 20 (*)    Calcium 8.7 (*)    All other components within normal limits  ETHANOL - Abnormal; Notable for the following components:   Alcohol, Ethyl (B) 11 (*)    All other components within normal limits  CBC WITH DIFFERENTIAL/PLATELET    CT Head Wo Contrast    (Results Pending)    No follow-ups on file.    Chad Knapp, Chad Cornea, MD 06/14/19 [phone]

## 2019-06-14 NOTE — ED Notes (Signed)
Spoke to wife and updated her. She is on her way to pick up pt. Wife should be here within 25 min.

## 2019-06-14 NOTE — ED Notes (Signed)
Pt. Was ambulated in rm, pt tolerated mobility in rm w/ no onset of dizziness or complaints of being lightheaded. Pt was also given a sandwich and a sprite and is sitting along side of the bed. No comlpaints of nausea

## 2020-11-07 ENCOUNTER — Other Ambulatory Visit: Payer: Self-pay

## 2020-11-07 ENCOUNTER — Emergency Department (HOSPITAL_COMMUNITY)
Admission: EM | Admit: 2020-11-07 | Discharge: 2020-11-07 | Disposition: A | Discharge status: Home / Self Care | Payer: Federal, State, Local not specified - PPO | Attending: Emergency Medicine | Admitting: Emergency Medicine

## 2020-11-07 ENCOUNTER — Emergency Department (HOSPITAL_COMMUNITY): Payer: Federal, State, Local not specified - PPO

## 2020-11-07 ENCOUNTER — Encounter (HOSPITAL_COMMUNITY): Payer: Self-pay

## 2020-11-07 DIAGNOSIS — M545 Low back pain, unspecified: Secondary | ICD-10-CM | POA: Diagnosis not present

## 2020-11-07 DIAGNOSIS — Z7952 Long term (current) use of systemic steroids: Secondary | ICD-10-CM | POA: Insufficient documentation

## 2020-11-07 DIAGNOSIS — Z87891 Personal history of nicotine dependence: Secondary | ICD-10-CM | POA: Insufficient documentation

## 2020-11-07 DIAGNOSIS — Z79899 Other long term (current) drug therapy: Secondary | ICD-10-CM | POA: Insufficient documentation

## 2020-11-07 DIAGNOSIS — R350 Frequency of micturition: Secondary | ICD-10-CM | POA: Insufficient documentation

## 2020-11-07 DIAGNOSIS — Z7982 Long term (current) use of aspirin: Secondary | ICD-10-CM | POA: Insufficient documentation

## 2020-11-07 DIAGNOSIS — Z85828 Personal history of other malignant neoplasm of skin: Secondary | ICD-10-CM | POA: Insufficient documentation

## 2020-11-07 DIAGNOSIS — R10A0 Flank pain, unspecified side: Secondary | ICD-10-CM

## 2020-11-07 DIAGNOSIS — R109 Unspecified abdominal pain: Secondary | ICD-10-CM | POA: Insufficient documentation

## 2020-11-07 LAB — COMPREHENSIVE METABOLIC PANEL
ALT: 25 U/L (ref 0–44)
AST: 19 U/L (ref 15–41)
Albumin: 3.8 g/dL (ref 3.5–5.0)
Alkaline Phosphatase: 61 U/L (ref 38–126)
Anion gap: 7 (ref 5–15)
BUN: 11 mg/dL (ref 8–23)
CO2: 23 mmol/L (ref 22–32)
Calcium: 8.8 mg/dL — ABNORMAL LOW (ref 8.9–10.3)
Chloride: 104 mmol/L (ref 98–111)
Creatinine, Ser: 0.87 mg/dL (ref 0.61–1.24)
GFR, Estimated: >60 mL/min (ref >60)
Glucose, Bld: 130 mg/dL — ABNORMAL HIGH (ref 70–99)
Potassium: 3.8 mmol/L (ref 3.5–5.1)
Sodium: 134 mmol/L — ABNORMAL LOW (ref 135–145)
Total Bilirubin: 0.4 mg/dL (ref 0.3–1.2)
Total Protein: 7.2 g/dL (ref 6.5–8.1)

## 2020-11-07 LAB — CBC WITH DIFFERENTIAL/PLATELET
Abs Immature Granulocytes: 0.03 10*3/uL (ref 0.00–0.07)
Basophils Absolute: 0 10*3/uL (ref 0.0–0.1)
Basophils Relative: 1 %
Eosinophils Absolute: 0.2 10*3/uL (ref 0.0–0.5)
Eosinophils Relative: 2 %
HCT: 44.3 % (ref 39.0–52.0)
Hemoglobin: 14.5 g/dL (ref 13.0–17.0)
Immature Granulocytes: 0 %
Lymphocytes Relative: 15 %
Lymphs Abs: 1.3 10*3/uL (ref 0.7–4.0)
MCH: 27.5 pg (ref 26.0–34.0)
MCHC: 32.7 g/dL (ref 30.0–36.0)
MCV: 84.1 fL (ref 80.0–100.0)
Monocytes Absolute: 0.5 10*3/uL (ref 0.1–1.0)
Monocytes Relative: 7 %
Neutro Abs: 6.3 10*3/uL (ref 1.7–7.7)
Neutrophils Relative %: 75 %
Platelets: 210 10*3/uL (ref 150–400)
RBC: 5.27 MIL/uL (ref 4.22–5.81)
RDW: 14.3 % (ref 11.5–15.5)
WBC: 8.3 10*3/uL (ref 4.0–10.5)
nRBC: 0 % (ref 0.0–0.2)

## 2020-11-07 LAB — URINALYSIS, ROUTINE W REFLEX MICROSCOPIC
Bilirubin Urine: NEGATIVE
Glucose, UA: NEGATIVE mg/dL
Hgb urine dipstick: NEGATIVE
Ketones, ur: NEGATIVE mg/dL
Leukocytes,Ua: NEGATIVE
Nitrite: NEGATIVE
Protein, ur: NEGATIVE mg/dL
Specific Gravity, Urine: 1.016 (ref 1.005–1.030)
pH: 6 (ref 5.0–8.0)

## 2020-11-07 LAB — LIPASE, BLOOD: Lipase: 34 U/L (ref 11–51)

## 2020-11-07 MED ORDER — HYDROCODONE-ACETAMINOPHEN 5-325 MG PO TABS: 1.0000 | ORAL_TABLET | Freq: Four times a day (QID) | ORAL | 0 refills | Status: DC | PRN

## 2020-11-07 MED ORDER — MORPHINE SULFATE (PF) 4 MG/ML IV SOLN
4.0000 mg | Freq: Once | INTRAVENOUS | Status: AC
  Administered 2020-11-07: 4 mg via INTRAVENOUS
  Filled 2020-11-07: qty 1

## 2020-11-07 MED ORDER — ONDANSETRON HCL 4 MG/2ML IJ SOLN
4.0000 mg | Freq: Once | INTRAMUSCULAR | Status: AC
  Administered 2020-11-07: 4 mg via INTRAVENOUS
  Filled 2020-11-07: qty 2

## 2020-11-07 MED ORDER — NAPROXEN 375 MG PO TABS: 375.0000 mg | ORAL_TABLET | Freq: Two times a day (BID) | ORAL | 0 refills | Status: DC

## 2020-11-07 NOTE — ED Triage Notes (Signed)
Pt arrived via walk in, c/o right flank pain, dysuria, urgency, frequency, since yesterday. States hx of kidney stones, feels similar.

## 2020-11-07 NOTE — Discharge Instructions (Addendum)
The blood tests and the CAT scan were reassuring.  There are no signs of kidney stones in the ureter .  take the medications as needed for pain.  Follow-up with your primary care doctor to be rechecked

## 2020-11-07 NOTE — ED Provider Notes (Signed)
[facility] DEPT Provider Note   CSN: [id number] Arrival date & time: 11/07/20  0710     History Chief Complaint  Patient presents with  . Flank Pain    Chad Knapp is a 70 y.o. male.  HPI   Pt woke up with pain in his lower back yest am.  It was sharp and also increased with movement.   The pain got a little better during the day but increased again last night.  The pain is on the right side and also in his abdomen.  It increases when he sits up and gets a little better reclining.  No blood in the urine.  He does have some pain in his side when he tries to urinate. He has had some frequency of urination.  He feels like he might have a kidney stone.   Past Medical History:  Diagnosis Date  . Anxiety   . Arthritis   . Cancer ([place])    basal cell -removed  . Depression   . Hx of transfusion of packed red blood cells 1974  . Shortness of breath   . Sleep apnea    cpap    Patient Active Problem List   Diagnosis Date Noted  . HEMORRHOIDS, INTERNAL 07/15/2007  . RHINITIS 07/15/2007  . GASTRITIS 07/15/2007  . DUODENITIS WITHOUT MENTION OF HEMORRHAGE 07/15/2007  . DIVERTICULOSIS OF COLON 07/15/2007  . ARTHRITIS 07/15/2007  . COLONIC POLYPS, HYPERPLASTIC, HX OF 07/15/2007    Past Surgical History:  Procedure Laterality Date  . ANTERIOR CERVICAL DECOMP/DISCECTOMY FUSION N/A 02/03/2013   Procedure: ACDF C6 - 7     1 LEVEL;  Surgeon: Melina Schools, MD;  Location: [facility];  Service: Orthopedics;  Laterality: N/A;  . APPENDECTOMY    . BASAL CELL CARCINOMA EXCISION    . CERVICAL SPINE SURGERY  02/03/2013   Dr Rolena Infante  . FRACTURE SURGERY Left    tib/fib/femur  . TESTICLE REMOVAL Left   . TONSILLECTOMY         Family History  Problem Relation Age of Onset  . Mental illness Mother     Social History   Tobacco Use  . Smoking status: Former Smoker    Quit date: 06/20/2007    Years since quitting: 13.3  . Smokeless tobacco: Never Used   Substance Use Topics  . Alcohol use: Yes    Comment: socially  . Drug use: Yes    Types: Marijuana    Home Medications Prior to Admission medications   Medication Sig Start Date End Date Taking? Authorizing Provider  HYDROcodone-acetaminophen (NORCO/VICODIN) 5-325 MG tablet Take 1 tablet by mouth every 6 (six) hours as needed. 11/07/20  Yes Dorie Rank, MD  naproxen (NAPROSYN) 375 MG tablet Take 1 tablet (375 mg total) by mouth 2 (two) times daily. 11/07/20  Yes Dorie Rank, MD  albuterol (PROVENTIL HFA;VENTOLIN HFA) 108 (90 BASE) MCG/ACT inhaler Inhale 2 puffs into the lungs every 4 (four) hours as needed for wheezing or shortness of breath.    [provider]  aspirin 81 MG tablet Take 81 mg by mouth daily.    [provider]  beclomethasone (QVAR) 40 MCG/ACT inhaler Inhale 1 puff into the lungs 2 (two) times daily. 07/03/16   Wendie Agreste, MD  Beclomethasone Diprop HFA (QVAR REDIHALER) 40 MCG/ACT AERB Inhale 1 puff into the lungs 2 (two) times daily. 09/02/16   Wendie Agreste, MD  carbidopa-levodopa (SINEMET IR) 25-100 MG tablet Take 1 tablet by  mouth 3 (three) times daily. 10/01/20   [provider]  escitalopram (LEXAPRO) 20 MG tablet Take 20 mg by mouth daily.    [provider]  fluticasone (FLONASE) 50 MCG/ACT nasal spray Place 2 sprays into the nose daily as needed for rhinitis or allergies.    [provider]  losartan (COZAAR) 100 MG tablet Take 50 mg by mouth daily. 10/31/20   [provider]  Multiple Vitamin (MULTIVITAMIN WITH MINERALS) TABS tablet Take 1 tablet by mouth daily.    [provider]  pantoprazole (PROTONIX) 40 MG tablet Take 40 mg by mouth daily. 08/14/20   [provider]  tamsulosin (FLOMAX) 0.4 MG CAPS capsule Take 0.4 mg by mouth daily. 10/31/20   [provider]    Allergies    Patient has no known allergies.  Review of Systems   Review of Systems  Constitutional: Negative  for fever.  Respiratory: Negative for cough and shortness of breath.   Cardiovascular: Negative for chest pain.  Gastrointestinal: Positive for nausea. Negative for diarrhea and vomiting.  Genitourinary: Positive for urgency.  Neurological: Positive for numbness.  All other systems reviewed and are negative.   Physical Exam Updated Vital Signs BP (!) 166/88   Pulse 76   Temp 98 F (36.7 C)   Resp 18   Ht 1.905 m (6\' 3" )   Wt 127 kg   SpO2 96%   BMI 35.00 kg/m   Physical Exam Vitals and nursing note reviewed.  Constitutional:      General: He is not in acute distress.    Appearance: He is well-developed.  HENT:     Head: Normocephalic and atraumatic.     Right Ear: External ear normal.     Left Ear: External ear normal.  Eyes:     General: No scleral icterus.       Right eye: No discharge.        Left eye: No discharge.     Conjunctiva/sclera: Conjunctivae normal.  Neck:     Trachea: No tracheal deviation.  Cardiovascular:     Rate and Rhythm: Normal rate and regular rhythm.  Pulmonary:     Effort: Pulmonary effort is normal. No respiratory distress.     Breath sounds: Normal breath sounds. No stridor. No wheezing or rales.  Abdominal:     General: Bowel sounds are normal. There is no distension.     Palpations: Abdomen is soft.     Tenderness: There is no abdominal tenderness. There is right CVA tenderness. There is no guarding or rebound.  Musculoskeletal:        General: No tenderness.     Cervical back: Neck supple.  Skin:    General: Skin is warm and dry.     Findings: No rash.  Neurological:     Mental Status: He is alert.     Cranial Nerves: No cranial nerve deficit (no facial droop, extraocular movements intact, no slurred speech).     Sensory: No sensory deficit.     Motor: No abnormal muscle tone or seizure activity.     Coordination: Coordination normal.     ED Results / Procedures / Treatments   Labs (all labs ordered are listed, but only  abnormal results are displayed) Labs Reviewed  COMPREHENSIVE METABOLIC PANEL - Abnormal; Notable for the following components:      Result Value   Sodium 134 (*)    Glucose, Bld 130 (*)    Calcium 8.8 (*)  All other components within normal limits  LIPASE, BLOOD  CBC WITH DIFFERENTIAL/PLATELET  URINALYSIS, ROUTINE W REFLEX MICROSCOPIC    EKG None  Radiology CT RENAL STONE STUDY  Result Date: 11/07/2020 CLINICAL DATA:  Flank pain EXAM: CT ABDOMEN AND PELVIS WITHOUT CONTRAST TECHNIQUE: Multidetector CT imaging of the abdomen and pelvis was performed following the standard protocol without oral or IV contrast. COMPARISON:  December 10, 2009 FINDINGS: Lower chest: There is scarring and atelectatic change in the lung bases, stable. No new parenchymal lung opacity in the lung bases evident. Hepatobiliary: No focal liver lesions are appreciable on this noncontrast enhanced study. Gallbladder wall is not appreciably thickened. There is no biliary duct dilatation. Pancreas: No pancreatic mass or inflammatory focus evident. Spleen: There are small calcified splenic granulomas. Spleen otherwise unremarkable in appearance. Adrenals/Urinary Tract: Adrenals bilaterally unremarkable. No appreciable renal mass or hydronephrosis on either side. There is a 3 x 2 mm calculus in the mid right kidney. There is a 1 mm calculus in the mid left kidney. No ureteral calculus evident on either side. Urinary bladder midline with wall thickness within normal limits. Stomach/Bowel: There are multiple sigmoid and descending colonic diverticula without diverticulitis. No bowel wall or mesenteric thickening. No evident bowel obstruction. Terminal ileum appears normal. Appendix absent. No periappendiceal region inflammation. Vascular/Lymphatic: No abdominal aortic aneurysm. There is aortic and iliac artery atherosclerosis. Note circumaortic left renal vein. No appreciable adenopathy in the abdomen or pelvis. Reproductive: Small  calcification in rightward aspect of prostate. Prostate and seminal vesicles overall normal in size and contour. Other: No abscess or ascites evident in the abdomen or pelvis. Slight fat in the umbilicus. Musculoskeletal: There is degenerative change in the lower thoracic and lumbar regions. No intramuscular lesions evident. IMPRESSION: 1. 3 x 2 mm calculus mid right kidney. 1 mm calculus mid left kidney. No appreciable hydronephrosis or ureteral calculus on either side. Urinary bladder wall thickness normal. 2. Multiple sigmoid and distal descending colonic diverticula without diverticulitis. No bowel obstruction. No abscess in the abdomen or pelvis. Appendix absent. 3. Scarring and atelectasis in the lung bases. Appearance similar to prior study. Subtle superimposed pneumonia in the bases is difficult to exclude in this circumstance. 4.  Small calcified splenic granulomas. 5.  Aortic Atherosclerosis (ICD10-I70.0). Electronically Signed   By: Lowella Grip III M.D.   On: 11/07/2020 09:56    Procedures Procedures   Medications Ordered in ED Medications  morphine 4 MG/ML injection 4 mg (4 mg Intravenous Given 11/07/20 0913)  ondansetron (ZOFRAN) injection 4 mg (4 mg Intravenous Given 11/07/20 0913)  morphine 4 MG/ML injection 4 mg (4 mg Intravenous Given 11/07/20 1109)    ED Course  I have reviewed the triage vital signs and the nursing notes.  Pertinent labs & imaging results that were available during my care of the patient were reviewed by me and considered in my medical decision making (see chart for details).  Clinical Course as of 11/08/20 0705  Wed Nov 07, 2020  1042 Labs reviewed.  CBC and metabolic panel normal.  Lipase normal.  Urinalysis without signs of infection [JK]  1043  stones noted in the kidney but no signs of any ureteral stones.  Argues against patient's flank pain being related to ureteral colic [JK]    Clinical Course User Index [JK] Dorie Rank, MD   MDM  Rules/Calculators/A&P  Patient presented to the ED with complaints of back pain.  Pain did increase with certain movements and positions however he started having some abdominal discomfort and patient was concerned and recurrent kidney stone.  Patient's laboratory tests are reassuring.  CT scan was performed to evaluate for ureteral stone or other acute intra-abdominal pathology.  CT scan does show renal stones but no ureteral stones.  No signs of aortic abnormality.  Suspect patient's symptoms are musculoskeletal in nature.  He could be having some radiculopathy.  No focal numbness or weakness.  No signs of infection.  Stable for outpatient management. Final Clinical Impression(s) / ED Diagnoses Final diagnoses:  Flank pain    Rx / DC Orders ED Discharge Orders         Ordered    HYDROcodone-acetaminophen (NORCO/VICODIN) 5-325 MG tablet  Every 6 hours PRN        11/07/20 1131    naproxen (NAPROSYN) 375 MG tablet  2 times daily        11/07/20 1131           Dorie Rank, MD 11/08/20 [phone]

## 2020-11-12 ENCOUNTER — Other Ambulatory Visit: Payer: Self-pay | Admitting: Urgent Care

## 2020-11-12 ENCOUNTER — Other Ambulatory Visit: Payer: Self-pay

## 2020-11-12 ENCOUNTER — Ambulatory Visit
Admission: RE | Admit: 2020-11-12 | Discharge: 2020-11-12 | Disposition: A | Discharge status: Home / Self Care | Payer: Federal, State, Local not specified - PPO | Source: Ambulatory Visit | Attending: Urgent Care | Admitting: Urgent Care

## 2020-11-12 DIAGNOSIS — M5416 Radiculopathy, lumbar region: Secondary | ICD-10-CM

## 2020-11-13 ENCOUNTER — Other Ambulatory Visit: Payer: Self-pay | Admitting: Urgent Care

## 2020-11-13 ENCOUNTER — Other Ambulatory Visit: Payer: Self-pay

## 2020-11-13 DIAGNOSIS — M545 Low back pain, unspecified: Secondary | ICD-10-CM

## 2020-11-25 ENCOUNTER — Ambulatory Visit
Admission: RE | Admit: 2020-11-25 | Discharge: 2020-11-25 | Disposition: A | Discharge status: Home / Self Care | Payer: Federal, State, Local not specified - PPO | Source: Ambulatory Visit | Attending: Urgent Care | Admitting: Urgent Care

## 2020-11-25 ENCOUNTER — Other Ambulatory Visit: Payer: Self-pay

## 2020-11-25 DIAGNOSIS — M545 Low back pain, unspecified: Secondary | ICD-10-CM

## 2021-02-20 ENCOUNTER — Ambulatory Visit: Payer: No Typology Code available for payment source | Attending: Neurological Surgery

## 2021-02-20 ENCOUNTER — Other Ambulatory Visit: Payer: Self-pay

## 2021-02-20 DIAGNOSIS — M5441 Lumbago with sciatica, right side: Secondary | ICD-10-CM | POA: Insufficient documentation

## 2021-02-20 DIAGNOSIS — G8929 Other chronic pain: Secondary | ICD-10-CM | POA: Diagnosis present

## 2021-02-20 DIAGNOSIS — M6281 Muscle weakness (generalized): Secondary | ICD-10-CM | POA: Insufficient documentation

## 2021-02-20 DIAGNOSIS — R2689 Other abnormalities of gait and mobility: Secondary | ICD-10-CM | POA: Insufficient documentation
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
## 2021-02-26 ENCOUNTER — Other Ambulatory Visit: Payer: Self-pay

## 2021-02-26 ENCOUNTER — Ambulatory Visit: Payer: No Typology Code available for payment source | Attending: Neurological Surgery

## 2021-02-26 DIAGNOSIS — G8929 Other chronic pain: Secondary | ICD-10-CM | POA: Diagnosis present

## 2021-02-26 DIAGNOSIS — M6281 Muscle weakness (generalized): Secondary | ICD-10-CM | POA: Insufficient documentation

## 2021-02-26 DIAGNOSIS — M5441 Lumbago with sciatica, right side: Secondary | ICD-10-CM | POA: Insufficient documentation

## 2021-02-26 DIAGNOSIS — R2689 Other abnormalities of gait and mobility: Secondary | ICD-10-CM | POA: Diagnosis present

## 2021-02-26 NOTE — Therapy (Signed)
[place], [place], [address] Phone: [phone]   Fax:  [phone]  Physical Therapy Treatment  Patient Details  Name: Chad Knapp MRN: [id number] Date of Birth: [birth year] Referring Provider (PT): Eustace Moore, MD   Encounter Date: 02/26/2021   PT End of Session - 02/26/21 0833     Visit Number 2    Number of Visits 15    Date for PT Re-Evaluation 05/01/21    Authorization Type VA Auth 02/11/21 - 06/10/21    Authorization - Visit Number 2    Authorization - Number of Visits 15    PT Start Time 0831    PT Stop Time 0915    PT Time Calculation (min) 44 min    Activity Tolerance Patient tolerated treatment well    Behavior During Therapy WFL for tasks assessed/performed             Past Medical History:  Diagnosis Date   Anxiety    Arthritis    Cancer ([place])    basal cell -removed   Depression    Hx of transfusion of packed red blood cells 1974   Shortness of breath    Sleep apnea    cpap    Past Surgical History:  Procedure Laterality Date   ANTERIOR CERVICAL DECOMP/DISCECTOMY FUSION N/A 02/03/2013   Procedure: ACDF C6 - 7     1 LEVEL;  Surgeon: Melina Schools, MD;  Location: [facility];  Service: Orthopedics;  Laterality: N/A;   APPENDECTOMY     BASAL CELL CARCINOMA EXCISION     CERVICAL SPINE SURGERY  02/03/2013   Dr Rolena Infante   FRACTURE SURGERY Left    tib/fib/femur   TESTICLE REMOVAL Left    TONSILLECTOMY      There were no vitals filed for this visit.   Subjective Assessment - 02/26/21 0833     Subjective Pt presents to PT with reports of continued L sided lower back pain that is sharp, but he notes reduced paresthesias in R anterior thigh. Pt has been compliant with his HEP with no adverse effect. He is ready to beign PT treatment at this time.    Currently in Pain? No/denies    Pain Score 0-No pain           [facility] Adult PT Treatment/Exercise:   Therapeutic Exercise:   NuStep lvl 5 x 5 min UE/LE while taking subjective  STS no UE support 2x10 POE x 60 sec Bridge 2x10 Supine PPT 2x10 - 5 sec hold Supine march x 20 Leg press 3x8 - 60lbs                            PT Education - 02/26/21 0930     Education Details HEP update    Person(s) Educated Patient    Methods Explanation;Demonstration;Handout    Comprehension Verbalized understanding;Returned demonstration              PT Short Term Goals - 02/20/21 1012       PT SHORT TERM GOAL #1   Title Pt will be compliant and knowledgeable with 90% of initial HEP for improved comfort and carryover    Baseline initial HEP given    Time 3    Period Weeks    Status New    Target Date 03/13/21               PT Long Term Goals -  02/20/21 1141       PT LONG TERM GOAL #1   Title Pt will improve FOTO score to no less than 61% as proxy for functional improvement    Baseline 56% function    Time 8    Period Weeks    Status New    Target Date 05/15/21      PT LONG TERM GOAL #2   Title Pt will decrease 5xSTS to no greater than 12 sec with no UE assist for improved functional mobility    Baseline 16 sec w/ UE assist    Time 8    Period Weeks    Status New    Target Date 05/15/21      PT LONG TERM GOAL #3   Title Pt will self report lower back pain no greater than 2/10 at worst in order to improve comfort and function    Baseline 5/10 at worst    Time 8    Period Weeks    Status New    Target Date 05/15/21      PT LONG TERM GOAL #4   Title Pt will improve standing tolerance to greater than 30 minutes in to improve comfort with preparing meals    Baseline 5 min standing tolerance at present    Time 8    Period Weeks    Status New    Target Date 05/15/21                   Plan - 02/26/21 0844     Clinical Impression Statement Pt was able ot complete all prescribed exercises, with conitnued extension preference noted. Today's session we focused  on increasing core endurance and proximal hip muscle strength. Pt continues to benefit from skilled PT services and should continue to be seen and progressed as tolerated.    PT Treatment/Interventions ADLs/Self Care Home Management;Electrical Stimulation;Moist Heat;Cryotherapy;Gait training;Stair training;Functional mobility training;Therapeutic activities;Therapeutic exercise;Balance training;Neuromuscular re-education;Patient/family education;Manual techniques;Passive range of motion;Dry needling;Taping;Vasopneumatic Device;Spinal Manipulations;Joint Manipulations    PT Next Visit Plan assess response to HEP and extension prefence exercises; progress core and proximal hip strengthening as able    PT Home Exercise Plan Access Code: [place]             Patient will benefit from skilled therapeutic intervention in order to improve the following deficits and impairments:  Abnormal gait, Decreased activity tolerance, Decreased balance, Decreased endurance, Decreased mobility, Decreased range of motion, Decreased strength, Difficulty walking, Impaired sensation, Improper body mechanics, Pain  Visit Diagnosis: Chronic bilateral low back pain with right-sided sciatica  Muscle weakness (generalized)  Other abnormalities of gait and mobility     Problem List Patient Active Problem List   Diagnosis Date Noted   HEMORRHOIDS, INTERNAL 07/15/2007   RHINITIS 07/15/2007   GASTRITIS 07/15/2007   DUODENITIS WITHOUT MENTION OF HEMORRHAGE 07/15/2007   DIVERTICULOSIS OF COLON 07/15/2007   ARTHRITIS 07/15/2007   COLONIC POLYPS, HYPERPLASTIC, HX OF 07/15/2007    Ward Chatters, PT, DPT 02/26/21 9:47 AM  [facility] [address] [place], [place], [address] Phone: [phone]   Fax:  [phone]  Name: LENIX SLEETER MRN: [id number] Date of Birth: 1950-08-04

## 2021-03-01 DIAGNOSIS — I10 Essential (primary) hypertension: Secondary | ICD-10-CM | POA: Insufficient documentation

## 2021-03-01 DIAGNOSIS — R310 Gross hematuria: Secondary | ICD-10-CM | POA: Insufficient documentation

## 2021-03-04 ENCOUNTER — Ambulatory Visit: Payer: No Typology Code available for payment source | Admitting: Physical Therapy

## 2021-03-07 ENCOUNTER — Encounter: Payer: Self-pay | Admitting: Physical Therapy

## 2021-03-07 ENCOUNTER — Ambulatory Visit: Payer: No Typology Code available for payment source | Admitting: Physical Therapy

## 2021-03-07 ENCOUNTER — Other Ambulatory Visit: Payer: Self-pay

## 2021-03-07 DIAGNOSIS — M5441 Lumbago with sciatica, right side: Secondary | ICD-10-CM

## 2021-03-07 DIAGNOSIS — R2689 Other abnormalities of gait and mobility: Secondary | ICD-10-CM

## 2021-03-07 DIAGNOSIS — M6281 Muscle weakness (generalized): Secondary | ICD-10-CM

## 2021-03-07 DIAGNOSIS — G8929 Other chronic pain: Secondary | ICD-10-CM

## 2021-03-07 NOTE — Therapy (Addendum)
[place], [place], [address] Phone: [phone]   Fax:  [phone]  Physical Therapy Treatment/Discharge  Patient Details  Name: Chad Knapp MRN: [id number] Date of Birth: March 18, 1951 Referring Provider (PT): Eustace Moore, MD   Encounter Date: 03/07/2021   PT End of Session - 03/07/21 1108     Visit Number 3    Number of Visits 15    Date for PT Re-Evaluation 05/01/21    Authorization Type VA Auth 02/11/21 - 06/10/21    Authorization - Visit Number 3    Authorization - Number of Visits 15    PT Start Time 1100    PT Stop Time 1140    PT Time Calculation (min) 40 min             Past Medical History:  Diagnosis Date   Anxiety    Arthritis    Cancer ([place])    basal cell -removed   Depression    Hx of transfusion of packed red blood cells 1974   Shortness of breath    Sleep apnea    cpap    Past Surgical History:  Procedure Laterality Date   ANTERIOR CERVICAL DECOMP/DISCECTOMY FUSION N/A 02/03/2013   Procedure: ACDF C6 - 7     1 LEVEL;  Surgeon: Melina Schools, MD;  Location: [facility];  Service: Orthopedics;  Laterality: N/A;   APPENDECTOMY     BASAL CELL CARCINOMA EXCISION     CERVICAL SPINE SURGERY  02/03/2013   Dr Rolena Infante   FRACTURE SURGERY Left    tib/fib/femur   TESTICLE REMOVAL Left    TONSILLECTOMY      There were no vitals filed for this visit.   Subjective Assessment - 03/07/21 1102     Subjective Had a catch in the hip this morning. Trying to work it out. The back is hurting very liitle, just the left side. I've had some blood in the urine so I have an appt with urologist about it. I re-joined the [employer] 2 days ago.    Pertinent History chronic lower back pain w/ radicular symptoms into R LE    Pain Score 3     Pain Location Back    Pain Orientation Left    Pain Descriptors / Indicators --   catching in hip  and twinge in back   Pain Type Chronic pain    Pain Radiating  Towards right hip joint    Aggravating Factors  woke up with catch in hip    Pain Relieving Factors work it out.              [facility] Adult PT Treatment/Exercise:   Therapeutic Exercise:  NuStep lvl 5 x 5 min UE/LE while taking subjective  Leg Press x 30; 60 lbs Figure 4 push and pull- hooklying , bilateral  Supine PPT 2 x 10- 5 sec hold  Supine March with PPT x 20  Lower trunk rotations 10 sec x 10  Bridge x 10 x 2 POE x 60 sec Prone lying: alternating hip extension 10 x 2      PT Short Term Goals - 02/20/21 1012       PT SHORT TERM GOAL #1   Title Pt will be compliant and knowledgeable with 90% of initial HEP for improved comfort and carryover    Baseline initial HEP given    Time 3    Period Weeks    Status New  Target Date 03/13/21               PT Long Term Goals - 02/20/21 1141       PT LONG TERM GOAL #1   Title Pt will improve FOTO score to no less than 61% as proxy for functional improvement    Baseline 56% function    Time 8    Period Weeks    Status New    Target Date 05/15/21      PT LONG TERM GOAL #2   Title Pt will decrease 5xSTS to no greater than 12 sec with no UE assist for improved functional mobility    Baseline 16 sec w/ UE assist    Time 8    Period Weeks    Status New    Target Date 05/15/21      PT LONG TERM GOAL #3   Title Pt will self report lower back pain no greater than 2/10 at worst in order to improve comfort and function    Baseline 5/10 at worst    Time 8    Period Weeks    Status New    Target Date 05/15/21      PT LONG TERM GOAL #4   Title Pt will improve standing tolerance to greater than 30 minutes in to improve comfort with preparing meals    Baseline 5 min standing tolerance at present    Time 8    Period Weeks    Status New    Target Date 05/15/21                   Plan - 03/07/21 1108     Clinical Impression Statement Pt reports increase in "Catch" in right hip today. He reports this has  been present for a couple of weeks. He notes decreased left low back pain with intermittent twinges and he also reports improving numbness in tight thigh. Continued with extension bias therex and added additional hip mobility and stretches. Pt tolerated session well without increased pain.    PT Treatment/Interventions ADLs/Self Care Home Management;Electrical Stimulation;Moist Heat;Cryotherapy;Gait training;Stair training;Functional mobility training;Therapeutic activities;Therapeutic exercise;Balance training;Neuromuscular re-education;Patient/family education;Manual techniques;Passive range of motion;Dry needling;Taping;Vasopneumatic Device;Spinal Manipulations;Joint Manipulations    PT Next Visit Plan assess response to HEP and extension prefence exercises; progress core and proximal hip strengthening as able    PT Home Exercise Plan Access Code: [place]             Patient will benefit from skilled therapeutic intervention in order to improve the following deficits and impairments:  Abnormal gait, Decreased activity tolerance, Decreased balance, Decreased endurance, Decreased mobility, Decreased range of motion, Decreased strength, Difficulty walking, Impaired sensation, Improper body mechanics, Pain  Visit Diagnosis: No diagnosis found.     Problem List Patient Active Problem List   Diagnosis Date Noted   HEMORRHOIDS, INTERNAL 07/15/2007   RHINITIS 07/15/2007   GASTRITIS 07/15/2007   DUODENITIS WITHOUT MENTION OF HEMORRHAGE 07/15/2007   DIVERTICULOSIS OF COLON 07/15/2007   ARTHRITIS 07/15/2007   COLONIC POLYPS, HYPERPLASTIC, HX OF 07/15/2007    Dorene Ar, PTA 03/07/2021, 11:32 AM  [facility] [address] [place], [place], [address] Phone: [phone]   Fax:  [phone]  Name: KUNTE CARDA MRN: [id number] Date of Birth: 1950/06/27  PHYSICAL THERAPY DISCHARGE SUMMARY  Visits from Start of Care:  3  Current functional level related to goals / functional outcomes: Unable to assess   Remaining deficits: Unable to  assess   Education / Equipment: None   Patient agrees to discharge. Patient goals were  unable to assess . Patient is being discharged due to not returning since the last visit.

## 2021-03-12 ENCOUNTER — Ambulatory Visit: Payer: No Typology Code available for payment source

## 2021-03-14 ENCOUNTER — Ambulatory Visit: Payer: No Typology Code available for payment source

## 2021-03-19 ENCOUNTER — Ambulatory Visit: Payer: No Typology Code available for payment source

## 2021-03-21 ENCOUNTER — Encounter: Payer: Federal, State, Local not specified - PPO | Admitting: Physical Therapy

## 2021-03-22 ENCOUNTER — Ambulatory Visit: Payer: No Typology Code available for payment source | Admitting: Physical Therapy

## 2021-03-29 ENCOUNTER — Ambulatory Visit: Payer: No Typology Code available for payment source | Admitting: Physical Therapy

## 2021-08-30 ENCOUNTER — Ambulatory Visit
Admission: EM | Admit: 2021-08-30 | Discharge: 2021-08-30 | Disposition: A | Discharge status: Home / Self Care | Payer: Federal, State, Local not specified - PPO

## 2021-08-30 ENCOUNTER — Encounter (HOSPITAL_COMMUNITY): Payer: Self-pay | Admitting: Emergency Medicine

## 2021-08-30 ENCOUNTER — Emergency Department (HOSPITAL_COMMUNITY)
Admission: EM | Admit: 2021-08-30 | Discharge: 2021-08-30 | Disposition: A | Discharge status: Home / Self Care | Payer: No Typology Code available for payment source | Attending: Emergency Medicine | Admitting: Emergency Medicine

## 2021-08-30 ENCOUNTER — Emergency Department (HOSPITAL_COMMUNITY): Payer: No Typology Code available for payment source

## 2021-08-30 ENCOUNTER — Other Ambulatory Visit: Payer: Self-pay

## 2021-08-30 DIAGNOSIS — J069 Acute upper respiratory infection, unspecified: Secondary | ICD-10-CM

## 2021-08-30 DIAGNOSIS — Z7982 Long term (current) use of aspirin: Secondary | ICD-10-CM | POA: Diagnosis not present

## 2021-08-30 DIAGNOSIS — R000 Tachycardia, unspecified: Secondary | ICD-10-CM | POA: Insufficient documentation

## 2021-08-30 DIAGNOSIS — R0602 Shortness of breath: Secondary | ICD-10-CM | POA: Diagnosis present

## 2021-08-30 DIAGNOSIS — R051 Acute cough: Secondary | ICD-10-CM | POA: Diagnosis not present

## 2021-08-30 DIAGNOSIS — Z20822 Contact with and (suspected) exposure to covid-19: Secondary | ICD-10-CM | POA: Insufficient documentation

## 2021-08-30 LAB — I-STAT VENOUS BLOOD GAS, ED
Acid-Base Excess: 0 mmol/L (ref 0.0–2.0)
Bicarbonate: 21.4 mmol/L (ref 20.0–28.0)
Calcium, Ion: 1.18 mmol/L (ref 1.15–1.40)
HCT: 46 % (ref 39.0–52.0)
Hemoglobin: 15.6 g/dL (ref 13.0–17.0)
O2 Saturation: 91 %
Potassium: 4.4 mmol/L (ref 3.5–5.1)
Sodium: 137 mmol/L (ref 135–145)
TCO2: 22 mmol/L (ref 22–32)
pCO2, Ven: 27.9 mmHg — ABNORMAL LOW (ref 44–60)
pH, Ven: 7.493 — ABNORMAL HIGH (ref 7.25–7.43)
pO2, Ven: 55 mmHg — ABNORMAL HIGH (ref 32–45)

## 2021-08-30 LAB — COMPREHENSIVE METABOLIC PANEL
ALT: 9 U/L (ref 0–44)
AST: 20 U/L (ref 15–41)
Albumin: 3.9 g/dL (ref 3.5–5.0)
Alkaline Phosphatase: 61 U/L (ref 38–126)
Anion gap: 9 (ref 5–15)
BUN: 18 mg/dL (ref 8–23)
CO2: 21 mmol/L — ABNORMAL LOW (ref 22–32)
Calcium: 9.3 mg/dL (ref 8.9–10.3)
Chloride: 107 mmol/L (ref 98–111)
Creatinine, Ser: 0.92 mg/dL (ref 0.61–1.24)
GFR, Estimated: >60 mL/min (ref >60)
Glucose, Bld: 89 mg/dL (ref 70–99)
Potassium: 4.3 mmol/L (ref 3.5–5.1)
Sodium: 137 mmol/L (ref 135–145)
Total Bilirubin: 0.7 mg/dL (ref 0.3–1.2)
Total Protein: 7.3 g/dL (ref 6.5–8.1)

## 2021-08-30 LAB — RESP PANEL BY RT-PCR (FLU A&B, COVID) ARPGX2
Influenza A by PCR: NEGATIVE
Influenza B by PCR: NEGATIVE
SARS Coronavirus 2 by RT PCR: NEGATIVE

## 2021-08-30 LAB — CBC WITH DIFFERENTIAL/PLATELET
Abs Immature Granulocytes: 0.04 10*3/uL (ref 0.00–0.07)
Basophils Absolute: 0 10*3/uL (ref 0.0–0.1)
Basophils Relative: 1 %
Eosinophils Absolute: 0.4 10*3/uL (ref 0.0–0.5)
Eosinophils Relative: 4 %
HCT: 46.5 % (ref 39.0–52.0)
Hemoglobin: 16.4 g/dL (ref 13.0–17.0)
Immature Granulocytes: 1 %
Lymphocytes Relative: 16 %
Lymphs Abs: 1.4 10*3/uL (ref 0.7–4.0)
MCH: 30.8 pg (ref 26.0–34.0)
MCHC: 35.3 g/dL (ref 30.0–36.0)
MCV: 87.2 fL (ref 80.0–100.0)
Monocytes Absolute: 0.8 10*3/uL (ref 0.1–1.0)
Monocytes Relative: 9 %
Neutro Abs: 6 10*3/uL (ref 1.7–7.7)
Neutrophils Relative %: 69 %
Platelets: 161 10*3/uL (ref 150–400)
RBC: 5.33 MIL/uL (ref 4.22–5.81)
RDW: 13.1 % (ref 11.5–15.5)
WBC: 8.6 10*3/uL (ref 4.0–10.5)
nRBC: 0 % (ref 0.0–0.2)

## 2021-08-30 MED ORDER — ALBUTEROL SULFATE HFA 108 (90 BASE) MCG/ACT IN AERS
2.0000 | INHALATION_SPRAY | Freq: Once | RESPIRATORY_TRACT | Status: AC
  Administered 2021-08-30: 2 via RESPIRATORY_TRACT
  Filled 2021-08-30: qty 6.7

## 2021-08-30 MED ORDER — METHYLPREDNISOLONE SODIUM SUCC 125 MG IJ SOLR
125.0000 mg | Freq: Once | INTRAMUSCULAR | Status: AC
  Administered 2021-08-30: 125 mg via INTRAVENOUS
  Filled 2021-08-30: qty 2

## 2021-08-30 MED ORDER — PREDNISONE 10 MG PO TABS: ORAL_TABLET | ORAL | 0 refills | Status: AC

## 2021-08-30 NOTE — ED Notes (Signed)
When walking pt o2 was between 95% and 96 % Hr highest when walking was 104. Pt walk around [place]. A little complaint of SOB pt stated that when walking on flat surfaces he is fine he gets really SOB on inclines.  ?

## 2021-08-30 NOTE — ED Provider Notes (Cosign Needed)
[facility] EMERGENCY DEPARTMENT  Provider Note  CSN: [id number] Arrival date & time: 08/30/21 1502  History Chief Complaint  Patient presents with   Shortness of Breath    Chad Knapp is a 71 y.o. male presents today with shortness of breath and cough.  Patient states his symptoms have been going on for multiple days.  Upper respiratory congestion, some nasal congestion.  He went to urgent care today as his symptoms did not seem to be improving.  He was apparently hypoxic there and was sent here.  He states he has not no finding on his CT scan that he is following up with pulmonology regarding.  He does not take any inhalers at home.  He has no known sick contacts.  No recent travel.  No leg pain or leg swelling.  No chest pain.   Home Medications Prior to Admission medications   Medication Sig Start Date End Date Taking? Authorizing Provider  predniSONE (DELTASONE) 10 MG tablet Take 6 tablets (60 mg total) by mouth daily for 1 day, THEN 4 tablets (40 mg total) daily for 5 days. 08/30/21 09/05/21 Yes Jacelyn Pi, MD  albuterol (PROVENTIL HFA;VENTOLIN HFA) 108 (90 BASE) MCG/ACT inhaler Inhale 2 puffs into the lungs every 4 (four) hours as needed for wheezing or shortness of breath.    [provider]  aspirin 81 MG tablet Take 81 mg by mouth daily.    [provider]  beclomethasone (QVAR) 40 MCG/ACT inhaler Inhale 1 puff into the lungs 2 (two) times daily. 07/03/16   Wendie Agreste, MD  Beclomethasone Diprop HFA (QVAR REDIHALER) 40 MCG/ACT AERB Inhale 1 puff into the lungs 2 (two) times daily. 09/02/16   Wendie Agreste, MD  carbidopa-levodopa (SINEMET IR) 25-100 MG tablet Take 1 tablet by mouth 3 (three) times daily. 10/01/20   [provider]  escitalopram (LEXAPRO) 20 MG tablet Take 20 mg by mouth daily.    [provider]  fluticasone (FLONASE) 50 MCG/ACT nasal spray Place 2 sprays into the nose daily as needed for rhinitis  or allergies.    [provider]  HYDROcodone-acetaminophen (NORCO/VICODIN) 5-325 MG tablet Take 1 tablet by mouth every 6 (six) hours as needed. Patient not taking: Reported on 02/20/2021 11/07/20   Dorie Rank, MD  losartan (COZAAR) 100 MG tablet Take 50 mg by mouth daily. 10/31/20   [provider]  Multiple Vitamin (MULTIVITAMIN WITH MINERALS) TABS tablet Take 1 tablet by mouth daily.    [provider]  naproxen (NAPROSYN) 375 MG tablet Take 1 tablet (375 mg total) by mouth 2 (two) times daily. Patient not taking: Reported on 02/20/2021 11/07/20   Dorie Rank, MD  pantoprazole (PROTONIX) 40 MG tablet Take 40 mg by mouth daily. 08/14/20   [provider]  tamsulosin (FLOMAX) 0.4 MG CAPS capsule Take 0.4 mg by mouth daily. 10/31/20   [provider]     Allergies    Lisinopril   Review of Systems   Review of Systems  Constitutional:  Negative for chills and fever.  HENT:  Positive for congestion, rhinorrhea and sneezing. Negative for ear pain and sore throat.   Eyes:  Negative for pain and visual disturbance.  Respiratory:  Positive for cough and shortness of breath.   Cardiovascular:  Negative for chest pain and palpitations.  Gastrointestinal:  Negative for abdominal pain and vomiting.  Genitourinary:  Negative for dysuria and hematuria.  Musculoskeletal:  Negative for arthralgias and back pain.  Skin:  Negative for color change and rash.  Neurological:  Negative for seizures and syncope.  All other systems reviewed and are negative. Please see HPI for pertinent positives and negatives  Physical Exam BP (!) 153/106    Pulse 86    Temp 97.9 F (36.6 C) (Oral)    Resp 17    Ht '6\' 3"'$  (1.905 m)    Wt 127 kg    SpO2 95%    BMI 35.00 kg/m   Physical Exam Vitals and nursing note reviewed.  Constitutional:      General: He is not in acute distress.    Appearance: He is well-developed. He is obese.  HENT:     Head: Normocephalic and  atraumatic.  Eyes:     Conjunctiva/sclera: Conjunctivae normal.  Cardiovascular:     Rate and Rhythm: Regular rhythm. Tachycardia present.     Heart sounds: No murmur heard. Pulmonary:     Effort: Pulmonary effort is normal. No respiratory distress.     Breath sounds: Wheezing present.  Abdominal:     Palpations: Abdomen is soft.     Tenderness: There is no abdominal tenderness.  Musculoskeletal:        General: No swelling.     Cervical back: Neck supple.  Skin:    General: Skin is warm and dry.     Capillary Refill: Capillary refill takes less than 2 seconds.  Neurological:     Mental Status: He is alert.  Psychiatric:        Mood and Affect: Mood normal.    ED Results / Procedures / Treatments   EKG EKG Interpretation  Date/Time:  Friday August 30 2021 15:28:39 EST Ventricular Rate:  94 PR Interval:  144 QRS Duration: 97 QT Interval:  370 QTC Calculation: 463 R Axis:   73 Text Interpretation: Sinus rhythm Baseline wander in lead(s) V6 No significant change since last tracing Confirmed by Wandra Arthurs [phone]) on 08/30/2021 3:40:46 PM  Procedures Procedures  Medications Ordered in the ED Medications  albuterol (VENTOLIN HFA) 108 (90 Base) MCG/ACT inhaler 2 puff (2 puffs Inhalation Given 08/30/21 1626)  methylPREDNISolone sodium succinate (SOLU-MEDROL) 125 mg/2 mL injection 125 mg (125 mg Intravenous Given 08/30/21 1808)     ED Course       MDM   This patient presents to the ED for concern of shortness of breath, this involves an extensive number of treatment options, and is a complaint that carries with it a high risk of complications and morbidity.  The differential diagnosis includes pneumonia, viral illness, PE.   Additional history obtained: Records reviewed Care Everywhere/External Records and Primary Care Documents  Lab Tests: I Ordered, and personally interpreted labs.  Reassuring.  COVID flu was negative.  Grossly unremarkable CBC and CMP.  Imaging  Studies ordered: I ordered imaging studies including X-ray chest   I independently visualized and interpreted imaging which showed no acute abnormalities. I agree with the radiologist interpretation  EKG (personally reviewed and interpreted): STEMI.  No ischemia.   Medical Decision Making: Patient presented with reported shortness of breath and cough.  He was apparently hypoxic at urgent care today.  However, he was at no time hypoxic here in the emergency department.  He is saturating well on room air.  His work-up was negative.  He had no signs of pneumonia on chest x-ray.  Labs are reassuring.  He had no leg pain or leg swelling.  No recent travel.  No other risk factors to  suggest PE.  Patient's symptom pattern of congestion, sore throat, and cough is suggestive of a viral illness.  He has symptomatic improvement with albuterol here in the emergency department.  He was given a dose of Solu-Medrol.  We will discharge home on a short course of steroids.  Plan for close outpatient follow-up with his PCP.  We discussed strict return precautions and importance of close outpatient follow-up.  Complexity of problems addressed: Patients presentation is most consistent with  acute presentation with potential threat to life or bodily function  Disposition: After consideration of the diagnostic results and the patients response to treatment,  I feel that the patent would benefit from discharge home .   Patient seen in conjunction with my attending, Dr. Darl Householder.    Final Clinical Impression(s) / ED Diagnoses Final diagnoses:  SOB (shortness of breath)  Upper respiratory tract infection, unspecified type    Rx / DC Orders ED Discharge Orders          Ordered    predniSONE (DELTASONE) 10 MG tablet        08/30/21 1837              Jacelyn Pi, MD 08/30/21 1927

## 2021-08-30 NOTE — ED Triage Notes (Signed)
Patient coming from [facility]. Complaint of shortness of breath. Pt states urgent care states O2 level was at 90% RA and that he needed to come to hospital. ?

## 2021-08-30 NOTE — ED Provider Notes (Signed)
?[place] ? ? ? ?CSN: [id number] ?Arrival date & time: 08/30/21  1231 ? ? ?  ? ?History   ?Chief Complaint ?Chief Complaint  ?Patient presents with  ? Cough  ? ? ?HPI ?Chad Knapp is a 71 y.o. male.  ? ?Patient presents for further evaluation of nasal congestion and cough that has been present for approximately 1.5 weeks.  Denies any known fevers.  He does report some intermittent shortness of breath but denies any chest pain.  Denies history of asthma or COPD.  Patient reports that he recently had pneumonia approximately 2 to 3 months ago.  He had a repeat chest x-ray at his primary care doctor that noted an abnormality so he was sent for a CT scan that noted a similar abnormality.  He has been referred to pulmonary for further evaluation and management of this and has an appointment in a few weeks. ? ? ?Cough ? ?Past Medical History:  ?Diagnosis Date  ? Anxiety   ? Arthritis   ? Cancer [facility])   ? basal cell -removed  ? Depression   ? Hx of transfusion of packed red blood cells 1974  ? Shortness of breath   ? Sleep apnea   ? cpap  ? ? ?Patient Active Problem List  ? Diagnosis Date Noted  ? HEMORRHOIDS, INTERNAL 07/15/2007  ? RHINITIS 07/15/2007  ? GASTRITIS 07/15/2007  ? DUODENITIS WITHOUT MENTION OF HEMORRHAGE 07/15/2007  ? DIVERTICULOSIS OF COLON 07/15/2007  ? ARTHRITIS 07/15/2007  ? COLONIC POLYPS, HYPERPLASTIC, HX OF 07/15/2007  ? ? ?Past Surgical History:  ?Procedure Laterality Date  ? ANTERIOR CERVICAL DECOMP/DISCECTOMY FUSION N/A 02/03/2013  ? Procedure: ACDF C6 - 7     1 LEVEL;  Surgeon: Melina Schools, MD;  Location: [place];  Service: Orthopedics;  Laterality: N/A;  ? APPENDECTOMY    ? BASAL CELL CARCINOMA EXCISION    ? CERVICAL SPINE SURGERY  02/03/2013  ? Dr Rolena Infante  ? FRACTURE SURGERY Left   ? tib/fib/femur  ? TESTICLE REMOVAL Left   ? TONSILLECTOMY    ? ? ? ? ? ?Home Medications   ? ?Prior to Admission medications   ?Medication Sig Start Date End Date Taking? Authorizing Provider   ?albuterol (PROVENTIL HFA;VENTOLIN HFA) 108 (90 BASE) MCG/ACT inhaler Inhale 2 puffs into the lungs every 4 (four) hours as needed for wheezing or shortness of breath.    [provider]  ?aspirin 81 MG tablet Take 81 mg by mouth daily.    [provider]  ?beclomethasone (QVAR) 40 MCG/ACT inhaler Inhale 1 puff into the lungs 2 (two) times daily. 07/03/16   Wendie Agreste, MD  ?Beclomethasone Diprop HFA (QVAR REDIHALER) 40 MCG/ACT AERB Inhale 1 puff into the lungs 2 (two) times daily. 09/02/16   Wendie Agreste, MD  ?carbidopa-levodopa (SINEMET IR) 25-100 MG tablet Take 1 tablet by mouth 3 (three) times daily. 10/01/20   [provider]  ?escitalopram (LEXAPRO) 20 MG tablet Take 20 mg by mouth daily.    [provider]  ?fluticasone (FLONASE) 50 MCG/ACT nasal spray Place 2 sprays into the nose daily as needed for rhinitis or allergies.    [provider]  ?HYDROcodone-acetaminophen (NORCO/VICODIN) 5-325 MG tablet Take 1 tablet by mouth every 6 (six) hours as needed. ?Patient not taking: Reported on 02/20/2021 11/07/20   Dorie Rank, MD  ?losartan (COZAAR) 100 MG tablet Take 50 mg by mouth daily. 10/31/20   [provider]  ?Multiple Vitamin (MULTIVITAMIN WITH  MINERALS) TABS tablet Take 1 tablet by mouth daily.    [provider]  ?naproxen (NAPROSYN) 375 MG tablet Take 1 tablet (375 mg total) by mouth 2 (two) times daily. ?Patient not taking: Reported on 02/20/2021 11/07/20   Dorie Rank, MD  ?pantoprazole (PROTONIX) 40 MG tablet Take 40 mg by mouth daily. 08/14/20   [provider]  ?tamsulosin (FLOMAX) 0.4 MG CAPS capsule Take 0.4 mg by mouth daily. 10/31/20   [provider]  ? ? ?Family History ?Family History  ?Problem Relation Age of Onset  ? Mental illness Mother   ? ? ?Social History ?Social History  ? ?Tobacco Use  ? Smoking status: Former  ?  Types: Cigarettes  ?  Quit date: 06/20/2007  ?  Years since quitting: 14.2  ? Smokeless  tobacco: Never  ?Substance Use Topics  ? Alcohol use: Yes  ?  Comment: socially  ? Drug use: Yes  ?  Types: Marijuana  ? ? ? ?Allergies   ?Lisinopril ? ? ?Review of Systems ?Review of Systems ?Per HPI ? ?Physical Exam ?Triage Vital Signs ?ED Triage Vitals  ?Enc Vitals Group  ?   BP 08/30/21 1352 (!) 145/111  ?   Pulse Rate 08/30/21 1352 99  ?   Resp 08/30/21 1352 18  ?   Temp 08/30/21 1352 97.8 ?F (36.6 ?C)  ?   Temp Source 08/30/21 1352 Oral  ?   SpO2 08/30/21 1352 92 %  ?   Weight --   ?   Height --   ?   Head Circumference --   ?   Peak Flow --   ?   Pain Score 08/30/21 1351 0  ?   Pain Loc --   ?   Pain Edu? --   ?   Excl. in [place]? --   ? ?No data found. ? ?Updated Vital Signs ?BP (!) 145/111 (BP Location: Left Arm)   Pulse 99   Temp 97.8 ?F (36.6 ?C) (Oral)   Resp 18   SpO2 92%  ? ?Visual Acuity ?Right Eye Distance:   ?Left Eye Distance:   ?Bilateral Distance:   ? ?Right Eye Near:   ?Left Eye Near:    ?Bilateral Near:    ? ?Physical Exam ?Constitutional:   ?   General: He is not in acute distress. ?   Appearance: Normal appearance. He is not toxic-appearing or diaphoretic.  ?HENT:  ?   Head: Normocephalic and atraumatic.  ?   Right Ear: Tympanic membrane and ear canal normal.  ?   Left Ear: Tympanic membrane and ear canal normal.  ?   Nose: Congestion present.  ?   Mouth/Throat:  ?   Mouth: Mucous membranes are moist.  ?   Pharynx: No posterior oropharyngeal erythema.  ?Eyes:  ?   Extraocular Movements: Extraocular movements intact.  ?   Conjunctiva/sclera: Conjunctivae normal.  ?   Pupils: Pupils are equal, round, and reactive to light.  ?Cardiovascular:  ?   Rate and Rhythm: Normal rate and regular rhythm.  ?   Pulses: Normal pulses.  ?   Heart sounds: Normal heart sounds.  ?Pulmonary:  ?   Effort: Pulmonary effort is normal. No respiratory distress.  ?   Breath sounds: Normal breath sounds. No stridor. No wheezing, rhonchi or rales.  ?Abdominal:  ?   General: Abdomen is flat. Bowel sounds are normal.  ?    Palpations: Abdomen is soft.  ?Musculoskeletal:     ?  General: Normal range of motion.  ?   Cervical back: Normal range of motion.  ?Skin: ?   General: Skin is warm and dry.  ?Neurological:  ?   General: No focal deficit present.  ?   Mental Status: He is alert and oriented to person, place, and time. Mental status is at baseline.  ?Psychiatric:     ?   Mood and Affect: Mood normal.     ?   Behavior: Behavior normal.  ? ? ? ?UC Treatments / Results  ?Labs ?(all labs ordered are listed, but only abnormal results are displayed) ?Labs Reviewed - No data to display ? ?EKG ? ? ?Radiology ?No results found. ? ?Procedures ?Procedures (including critical care time) ? ?Medications Ordered in UC ?Medications - No data to display ? ?Initial Impression / Assessment and Plan / UC Course  ?I have reviewed the triage vital signs and the nursing notes. ? ?Pertinent labs & imaging results that were available during my care of the patient were reviewed by me and considered in my medical decision making (see chart for details). ? ?  ? ?Patient's oxygen saturation was ranging from 90 to 91% during initial triage and physical exam.  Patient was advised that he will need to go to the hospital for further evaluation and management given oxygen saturation in the setting of acute cough and respiratory infection.  Patient was agreeable with plan.  Patient left via self transport as his oxygen had increased to 94 to 95% prior to discharge with deep breaths and he was not in respiratory distress or tacyhpneic. ?Final Clinical Impressions(s) / UC Diagnoses  ? ?Final diagnoses:  ?Acute cough  ?Acute upper respiratory infection  ? ? ? ?Discharge Instructions   ? ?  ?Please go to the emergency department as soon as you leave urgent care for further evaluation and management. ? ? ? ?ED Prescriptions   ?None ?  ? ?PDMP not reviewed this encounter. ?  ?Teodora Medici, [place] ?08/30/21 1422 ? ?

## 2021-08-30 NOTE — Discharge Instructions (Signed)
Please go to the emergency department as soon as you leave urgent care for further evaluation and management. ?

## 2021-08-30 NOTE — ED Triage Notes (Signed)
Pt c/o harsh cough, nasal drainage,  ? ?Denies sore throat, ear aches, headaches, nausea, vomiting, diarrhea, constipation  ? ?Onset ~ 1.5 weeks ago  ?

## 2021-08-30 NOTE — ED Notes (Signed)
Discharge instructions reviewed with patient. Patient verbalized understanding of instructions. Follow-up care and medications were reviewed. Patient ambulatory with steady gait. VSS upon discharge.  ?

## 2021-09-13 ENCOUNTER — Encounter: Payer: Federal, State, Local not specified - PPO | Admitting: Thoracic Surgery (Cardiothoracic Vascular Surgery)

## 2021-09-18 ENCOUNTER — Encounter: Payer: Self-pay | Admitting: Pulmonary Disease

## 2021-09-18 ENCOUNTER — Other Ambulatory Visit: Payer: Self-pay

## 2021-09-18 ENCOUNTER — Ambulatory Visit (INDEPENDENT_AMBULATORY_CARE_PROVIDER_SITE_OTHER): Payer: Federal, State, Local not specified - PPO | Admitting: Pulmonary Disease

## 2021-09-18 VITALS — BP 126/86 | HR 82 | Temp 97.6°F | Ht 75.0 in | Wt 288.2 lb

## 2021-09-18 DIAGNOSIS — Z720 Tobacco use: Secondary | ICD-10-CM | POA: Diagnosis not present

## 2021-09-18 DIAGNOSIS — R911 Solitary pulmonary nodule: Secondary | ICD-10-CM | POA: Insufficient documentation

## 2021-09-18 DIAGNOSIS — R0602 Shortness of breath: Secondary | ICD-10-CM | POA: Diagnosis not present

## 2021-09-18 MED ORDER — STIOLTO RESPIMAT 2.5-2.5 MCG/ACT IN AERS: 2.0000 | INHALATION_SPRAY | Freq: Every day | RESPIRATORY_TRACT | 0 refills | Status: DC

## 2021-09-18 NOTE — Progress Notes (Signed)
? ?Synopsis: Referred in March 2023 lung nodule by [facility] ? ?Subjective:  ? ?PATIENT ID: Chad Knapp GENDER: male DOB: 1950/07/25, MRN: [id number] ? ?Chief Complaint  ?Patient presents with  ? Follow-up  ?  Follow up  ? ? ?This is a 71 year old gentleman, past medical history of depression, shortness of breath, sleep apnea on CPAP, new diagnosis of Parkinson disease.  He had a recent CT chest [facility] at [facility].  CT imaging was able to be reviewed in PACS system.  Patient has a dominant approximate 12 mm right upper lobe lung nodule with areas of groundglass around this.  He is a longtime smoker.  He quit back in 2018.  He smoked from the late 1960s.  It was more of a social tobacco use.  He also recently diagnosed with Parkinson disease follows with neurologist at the [place].  Here today to discuss CT imaging and neck steps.  He also feels short of breath with exertion.  He uses as needed albuterol.  No previous pulmonary function test.  No prior diagnosis of COPD that he knows of. ? ? ?Past Medical History:  ?Diagnosis Date  ? Anxiety   ? Arthritis   ? Cancer [facility])   ? basal cell -removed  ? Depression   ? Hx of transfusion of packed red blood cells 1974  ? Shortness of breath   ? Sleep apnea   ? cpap  ?  ? ?Family History  ?Problem Relation Age of Onset  ? Mental illness Mother   ?  ? ?Past Surgical History:  ?Procedure Laterality Date  ? ANTERIOR CERVICAL DECOMP/DISCECTOMY FUSION N/A 02/03/2013  ? Procedure: ACDF C6 - 7     1 LEVEL;  Surgeon: Melina Schools, MD;  Location: [place];  Service: Orthopedics;  Laterality: N/A;  ? APPENDECTOMY    ? BASAL CELL CARCINOMA EXCISION    ? CERVICAL SPINE SURGERY  02/03/2013  ? Dr Rolena Infante  ? FRACTURE SURGERY Left   ? tib/fib/femur  ? TESTICLE REMOVAL Left   ? TONSILLECTOMY    ? ? ?Social History  ? ?Socioeconomic History  ? Marital status: Married  ?  Spouse name: Not on file  ? Number of children: Not on file  ? Years of education: Not on file  ?  Highest education level: Not on file  ?Occupational History  ? Not on file  ?Tobacco Use  ? Smoking status: Former  ?  Types: Cigarettes  ?  Quit date: 06/20/2007  ?  Years since quitting: 14.2  ? Smokeless tobacco: Never  ?Substance and Sexual Activity  ? Alcohol use: Yes  ?  Comment: socially  ? Drug use: Yes  ?  Types: Marijuana  ? Sexual activity: Yes  ?Other Topics Concern  ? Not on file  ?Social History Narrative  ? Not on file  ? ?Social Determinants of Health  ? ?Financial Resource Strain: Not on file  ?Food Insecurity: Not on file  ?Transportation Needs: Not on file  ?Physical Activity: Not on file  ?Stress: Not on file  ?Social Connections: Not on file  ?Intimate Partner Violence: Not on file  ?  ? ?Allergies  ?Allergen Reactions  ? Lisinopril   ?  Other reaction(s): Cough  ?  ? ?Outpatient Medications Prior to Visit  ?Medication Sig Dispense Refill  ? albuterol (PROVENTIL HFA;VENTOLIN HFA) 108 (90 BASE) MCG/ACT inhaler Inhale 2 puffs into the lungs every 4 (four) hours as needed for wheezing or shortness of  breath.    ? beclomethasone (QVAR) 40 MCG/ACT inhaler Inhale 1 puff into the lungs 2 (two) times daily. 1 Inhaler 3  ? Beclomethasone Diprop HFA (QVAR REDIHALER) 40 MCG/ACT AERB Inhale 1 puff into the lungs 2 (two) times daily. 1 Inhaler 3  ? carbidopa-levodopa (SINEMET IR) 25-100 MG tablet Take 1 tablet by mouth 3 (three) times daily.    ? escitalopram (LEXAPRO) 20 MG tablet Take 20 mg by mouth daily.    ? fluticasone (FLONASE) 50 MCG/ACT nasal spray Place 2 sprays into the nose daily as needed for rhinitis or allergies.    ? losartan (COZAAR) 100 MG tablet Take 50 mg by mouth daily.    ? Multiple Vitamin (MULTIVITAMIN WITH MINERALS) TABS tablet Take 1 tablet by mouth daily.    ? pantoprazole (PROTONIX) 40 MG tablet Take 40 mg by mouth daily.    ? tamsulosin (FLOMAX) 0.4 MG CAPS capsule Take 0.4 mg by mouth daily.    ? naproxen (NAPROSYN) 375 MG tablet Take 1 tablet (375 mg total) by mouth 2 (two)  times daily. (Patient not taking: Reported on 02/20/2021) 20 tablet 0  ? aspirin 81 MG tablet Take 81 mg by mouth daily.    ? HYDROcodone-acetaminophen (NORCO/VICODIN) 5-325 MG tablet Take 1 tablet by mouth every 6 (six) hours as needed. (Patient not taking: Reported on 02/20/2021) 12 tablet 0  ? ?No facility-administered medications prior to visit.  ? ? ?Review of Systems  ?Constitutional:  Negative for chills, fever, malaise/fatigue and weight loss.  ?HENT:  Negative for hearing loss, sore throat and tinnitus.   ?Eyes:  Negative for blurred vision and double vision.  ?Respiratory:  Positive for cough, sputum production and shortness of breath. Negative for hemoptysis, wheezing and stridor.   ?Cardiovascular:  Negative for chest pain, palpitations, orthopnea, leg swelling and PND.  ?Gastrointestinal:  Negative for abdominal pain, constipation, diarrhea, heartburn, nausea and vomiting.  ?Genitourinary:  Negative for dysuria, hematuria and urgency.  ?Musculoskeletal:  Negative for joint pain and myalgias.  ?Skin:  Negative for itching and rash.  ?Neurological:  Negative for dizziness, tingling, weakness and headaches.  ?Endo/Heme/Allergies:  Negative for environmental allergies. Does not bruise/bleed easily.  ?Psychiatric/Behavioral:  Negative for depression. The patient is not nervous/anxious and does not have insomnia.   ?All other systems reviewed and are negative. ? ? ?Objective:  ?Physical Exam ?Vitals reviewed.  ?Constitutional:   ?   General: He is not in acute distress. ?   Appearance: He is well-developed. He is obese.  ?HENT:  ?   Head: Normocephalic and atraumatic.  ?Eyes:  ?   General: No scleral icterus. ?   Conjunctiva/sclera: Conjunctivae normal.  ?   Pupils: Pupils are equal, round, and reactive to light.  ?Neck:  ?   Vascular: No JVD.  ?   Trachea: No tracheal deviation.  ?Cardiovascular:  ?   Rate and Rhythm: Normal rate and regular rhythm.  ?   Heart sounds: Normal heart sounds. No murmur  heard. ?Pulmonary:  ?   Effort: Pulmonary effort is normal. No tachypnea, accessory muscle usage or respiratory distress.  ?   Breath sounds: No stridor. No wheezing, rhonchi or rales.  ?   Comments: Diminished breath sounds bilaterally ?Abdominal:  ?   General: There is no distension.  ?   Palpations: Abdomen is soft.  ?   Tenderness: There is no abdominal tenderness.  ?Musculoskeletal:     ?   General: No tenderness.  ?   Cervical  back: Neck supple.  ?Lymphadenopathy:  ?   Cervical: No cervical adenopathy.  ?Skin: ?   General: Skin is warm and dry.  ?   Capillary Refill: Capillary refill takes less than 2 seconds.  ?   Findings: No rash.  ?Neurological:  ?   Mental Status: He is alert and oriented to person, place, and time.  ?Psychiatric:     ?   Behavior: Behavior normal.  ? ? ? ?Vitals:  ? 09/18/21 1007  ?BP: 126/86  ?Pulse: 82  ?Temp: 97.6 ?F (36.4 ?C)  ?TempSrc: Oral  ?SpO2: 97%  ?Weight: 288 lb 3.2 oz (130.7 kg)  ?Height: '6\' 3"'$  (1.905 m)  ? ?97% on RA ?BMI Readings from Last 3 Encounters:  ?09/18/21 36.02 kg/m?  ?08/30/21 35.00 kg/m?  ?11/07/20 35.00 kg/m?  ? ?Wt Readings from Last 3 Encounters:  ?09/18/21 288 lb 3.2 oz (130.7 kg)  ?08/30/21 279 lb 15.8 oz (127 kg)  ?11/07/20 280 lb (127 kg)  ? ? ? ?CBC ?   ?Component Value Date/Time  ? WBC 8.6 08/30/2021 1532  ? RBC 5.33 08/30/2021 1532  ? HGB 15.6 08/30/2021 1548  ? HGB 15.9 07/03/2016 1332  ? HCT 46.0 08/30/2021 1548  ? HCT 46.5 07/03/2016 1332  ? PLT 161 08/30/2021 1532  ? PLT 149 (L) 07/03/2016 1332  ? MCV 87.2 08/30/2021 1532  ? MCV 91 07/03/2016 1332  ? MCH 30.8 08/30/2021 1532  ? MCHC 35.3 08/30/2021 1532  ? RDW 13.1 08/30/2021 1532  ? RDW 13.7 07/03/2016 1332  ? LYMPHSABS 1.4 08/30/2021 1532  ? MONOABS 0.8 08/30/2021 1532  ? EOSABS 0.4 08/30/2021 1532  ? BASOSABS 0.0 08/30/2021 1532  ? ? ?Chest Imaging: ?August 20, 2021 CT chest: ?Reviewed in PACS system. ?Largest dominant nodule in the right upper lobe, other smaller lung nodules throughout the  chest, bilateral basilar platelike atelectasis. ?The patient's images have been independently reviewed by me.   ? ?Pulmonary Functions Testing Results: ?   ? View : No data to display.  ?  ?  ?  ? ? ?FeNO:  ? ?Pathology:

## 2021-09-18 NOTE — Patient Instructions (Signed)
Thank you for visiting Dr. Valeta Harms at [facility]. ?Today we recommend the following: ? ?Orders Placed This Encounter  ?Procedures  ? CT Super D Chest Wo Contrast  ? Pulmonary Function Test  ? ?CT Chest last week of may 2023 ?PFTs prior to next appt  ? ?Return in about 2 months (around 11/18/2021) for with Eric Form, NP, or Dr. Valeta Harms. ? ? ? ?Please do your part to reduce the spread of COVID-19.  ? ?

## 2021-09-18 NOTE — Addendum Note (Signed)
Addended by: Fran Lowes on: 09/18/2021 10:39 AM ? ? Modules accepted: Orders ? ?

## 2021-09-24 ENCOUNTER — Telehealth: Payer: Self-pay | Admitting: Pulmonary Disease

## 2021-09-24 MED ORDER — ANORO ELLIPTA 62.5-25 MCG/ACT IN AEPB: 1.0000 | INHALATION_SPRAY | Freq: Every day | RESPIRATORY_TRACT | 5 refills | Status: DC

## 2021-09-24 NOTE — Telephone Encounter (Signed)
Called and spoke to pt. Pt was seen on Wednesday 3/29 and given a sample of Stiolto. Pt states he started taking the Stiolto on Thursday 3/30. Pt states he first noticed a jittery feeling, anxiousness soon after taking the Stiolto (pt takes is at 0830 each morning). Pt states by early afternoon the feelings pass. Pt has been experiencing this each day after taking the Stiolto, pt took inhaler today as well. Pt is requesting a response today and to change inhalers.  ? ?Dr. Valeta Harms, please advise. Thanks.  ?

## 2021-09-24 NOTE — Telephone Encounter (Signed)
Called and spoke to pt. Pt is aware that bronchodilators cause the jitteriness. Pt states this was intense and he does not want to continue with this inhaler. Anoro has been sent to preferred pharmacy. Pt is appreciative of the change in the therapy. Pt aware to call back if there is any issues.  ? ?Will forward to Dr. Valeta Harms as Juluis Rainier. Thank you! ?

## 2021-09-25 NOTE — Telephone Encounter (Signed)
Patient states is feeling better today. Would like to know if he can continue taking Stiolto Respimat. Patient phone number is [phone]. ?

## 2021-09-25 NOTE — Telephone Encounter (Signed)
Called and spoke with pt about his medications. Answered questions that he had. Nothing further needed. ?

## 2021-09-25 NOTE — Telephone Encounter (Signed)
Called and spoke with patient. He stated that he went back to using the Stiolto this morning and did not have the reaction like yesterday morning. The difference in this morning was that he took his medications at 2 different times versus taking all at the same time. So far today he has felt ok.  ? ?He also stated that he is currently tapering off of venlafaxine 37.'5mg'$  and believes this was the medication that was causing the symptoms from yesterday.  ? ?He wanted to know if it would be safe for him to continue the Stiolto as it worked for him. I advised him that as long as he feels ok and has had no reactions to resume the Stiolto. The minute that he does not feel ok, to stop the Stiolto and call the office.  ? ?He verbalized understanding.  ? ?Nothing further needed at time of call.  ?

## 2021-10-03 ENCOUNTER — Telehealth: Payer: Self-pay | Admitting: Pulmonary Disease

## 2021-10-03 MED ORDER — STIOLTO RESPIMAT 2.5-2.5 MCG/ACT IN AERS: 2.0000 | INHALATION_SPRAY | Freq: Every day | RESPIRATORY_TRACT | 2 refills | Status: DC

## 2021-10-03 NOTE — Telephone Encounter (Signed)
Called patient and confirmed that the sample of the Stiolto did help him and that he did want a prescription sent in. Patient confirmed. Sent medication to the [facility] in [place]. Patient verbalized understanding and no questions. Nothing further  ?

## 2021-11-11 ENCOUNTER — Telehealth: Payer: Self-pay | Admitting: Pulmonary Disease

## 2021-11-11 MED ORDER — DIAZEPAM 5 MG PO TABS: 5.0000 mg | ORAL_TABLET | Freq: Four times a day (QID) | ORAL | 0 refills | Status: DC | PRN

## 2021-11-11 NOTE — Telephone Encounter (Signed)
Called patient and he states that his CT scan is on 5/31 and that he needs Diazepam ordered for him given that he is claustrophobic.   Please advise Dr Valeta Harms

## 2021-11-11 NOTE — Telephone Encounter (Signed)
Called patient and let him know that Dr Valeta Harms did sent in a rx for Valium '5mg'$  up to 2 doses before exam on 11/20/21. Patient verbalized understanding. Nothing further needed

## 2021-11-11 NOTE — Telephone Encounter (Signed)
PCCM:  Orders placed  Garner Nash, DO [facility] 11/11/2021 3:40 PM

## 2021-11-20 ENCOUNTER — Ambulatory Visit (HOSPITAL_COMMUNITY)
Admission: RE | Admit: 2021-11-20 | Discharge: 2021-11-20 | Disposition: A | Discharge status: Home / Self Care | Payer: No Typology Code available for payment source | Source: Ambulatory Visit | Attending: Pulmonary Disease | Admitting: Pulmonary Disease

## 2021-11-20 DIAGNOSIS — I700 Atherosclerosis of aorta: Secondary | ICD-10-CM | POA: Diagnosis not present

## 2021-11-20 DIAGNOSIS — R918 Other nonspecific abnormal finding of lung field: Secondary | ICD-10-CM | POA: Insufficient documentation

## 2021-11-20 DIAGNOSIS — R911 Solitary pulmonary nodule: Secondary | ICD-10-CM | POA: Diagnosis not present

## 2021-11-20 DIAGNOSIS — Z720 Tobacco use: Secondary | ICD-10-CM | POA: Insufficient documentation

## 2021-11-20 DIAGNOSIS — I2510 Atherosclerotic heart disease of native coronary artery without angina pectoris: Secondary | ICD-10-CM | POA: Diagnosis not present

## 2021-11-20 DIAGNOSIS — R0602 Shortness of breath: Secondary | ICD-10-CM | POA: Diagnosis present

## 2021-11-20 DIAGNOSIS — K760 Fatty (change of) liver, not elsewhere classified: Secondary | ICD-10-CM | POA: Diagnosis not present

## 2021-11-22 NOTE — Progress Notes (Signed)
SG, seeing you on the 30th. Nodule is stable. We can choose to continue to follow it? Repeat imaging? Or biopsy in the future if patient desires. The posterior right upper lobe measuring 1.4 x 1.2 cm with 9 mm solid Component is the most concerning.   Thanks,  BLI  Garner Nash, DO [facility] 11/22/2021 4:32 PM

## 2021-12-20 ENCOUNTER — Encounter: Payer: Self-pay | Admitting: Acute Care

## 2021-12-20 ENCOUNTER — Ambulatory Visit (INDEPENDENT_AMBULATORY_CARE_PROVIDER_SITE_OTHER): Payer: No Typology Code available for payment source | Admitting: Acute Care

## 2021-12-20 ENCOUNTER — Encounter: Payer: Self-pay | Admitting: Pulmonary Disease

## 2021-12-20 VITALS — BP 112/70 | HR 79 | Temp 97.6°F | Ht 75.0 in | Wt 277.0 lb

## 2021-12-20 DIAGNOSIS — R911 Solitary pulmonary nodule: Secondary | ICD-10-CM

## 2021-12-20 DIAGNOSIS — R0602 Shortness of breath: Secondary | ICD-10-CM

## 2021-12-20 DIAGNOSIS — Z87891 Personal history of nicotine dependence: Secondary | ICD-10-CM

## 2021-12-20 NOTE — Patient Instructions (Addendum)
It is good to see you today. We will schedule you for a bronchoscopy with biopsies 12/31/2021. This procedure is done in the hospital.  You have it done and usually go home the same day.  You will need a driver to take you to the hospital, and drive you home after the procedure. We discussed the rare risks of complications including infection, bleeding, and puncture of the lung. Dr. Valeta Harms can manage any of these complications.  We have confirmed that you are not on blood thinners.  You will get a call to get this procedure scheduled.  Continue Stialto 2 puffs daily as you have been doing.  Rinse mouth after use.  Continue using albuterol inhaler as you have been doing for breakthrough shortness of breath.  We will schedule PFT's after your bronch. Please schedule PFT's sometime after 12/31/2021.  You will do a follow up in the office  1 week after your procedure.  Continue CPAP machine  use as you have been doing.  Call us if you need [place] sooner.  Please contact office for sooner follow up if symptoms do not improve or worsen or seek emergency care

## 2021-12-20 NOTE — Progress Notes (Signed)
History of Present Illness Chad Knapp is a 71 y.o. male former smoker, quit 2008, with past medical history of depression, shortness of breath, sleep apnea on CPAP, new diagnosis of Parkinson disease.  He had a recent CT chest completed at the [facility]. Patient has a dominant approximate 12 mm right upper lobe lung nodule with areas of groundglass around this. He also has complaints of shortness of breath with exertion and sputum production.He has never been formally diagnosed with COPD.   He was referred to Dr. Valeta Knapp for follow up of the lung nodule.    6/30/2023Pt. Presents for follow up. He was seen by Dr. Valeta Knapp 09/18/2021. At that time Dr. Valeta Knapp planned for an 8 week repeat CT Chest, and started the patient on Chad Knapp as a therapeutic trial. PFT's were also ordered  to determine if the patient had COPD. The PFT's were ordered , but never scheduled. He did have the Chad Knapp CT Chest done which shows nodules similar to those noted on the previous scan, but the most concerning of which is a mixed solid and sub solid lesion in the posterior right upper lobe measuring 1.4 x 1.2 cm with 9 mm solid component.I have spoken with Dr. Valeta Knapp, and he feels this needs to be biopsied. I have spoken with the patient, and he is in agreement with a bronch with biopsies . Dr. Valeta Knapp can do this 12/31/2021. Pt. Is not on any blood thinners.  Pt. Has found the Chad Knapp is helpful. He breaths better after the inhaler. He feels it is no longer working in the evening. He would like something that lasts longer. I have asked him to schedule PFT's as soon as possible. If we see there is a component of COPD we can consider triple therapy with Chad Knapp  Test Results: Chad Knapp CT Chest Lungs/Pleura: Chronic elevation of the left hemidiaphragm with areas of chronic atelectasis and/or scarring in the left lung base. Mild chronic scarring/fibrosis also noted in the right lower lobe. Several well-defined solid  pulmonary nodules are again noted throughout the lungs bilaterally, stable compared to the prior study, largest of which measures 6 mm in the superior segment of the right lower lobe (axial image 75 of series 5). In addition, in the posterior aspect of the right upper lobe (axial image 55 of series 5) there is a mixed solid and sub solid lesion which measures up to 1.4 x 1.2 cm, with a solid 9 mm component (axial image 56 of series 5), very similar to the prior examination. No other new suspicious appearing pulmonary nodules or masses are noted. No acute consolidative airspace disease  Pulmonary nodules bilaterally, similar to the prior study, most concerning of which is a mixed solid and sub solid lesion in the posterior right upper lobe measuring 1.4 x 1.2 cm with 9 mm solid component. Aortic atherosclerosis, in addition to left main and 2 vessel coronary artery disease. Assessment for potential risk factor modification, dietary therapy or pharmacologic therapy may be warranted, if clinically indicated. Hepatic steatosis. Additional incidental findings, as above.      Latest Ref Rng & Units 08/30/2021    3:48 PM 08/30/2021    3:32 PM 11/07/2020    9:17 AM  CBC  WBC 4.0 - 10.5 K/uL  8.6  8.3   Hemoglobin 13.0 - 17.0 g/dL 15.6  16.4  14.5   Hematocrit 39.0 - 52.0 % 46.0  46.5  44.3   Platelets 150 - 400 K/uL  161  210        Latest Ref Rng & Units 08/30/2021    3:48 PM 08/30/2021    3:32 PM 11/07/2020    9:17 AM  BMP  Glucose 70 - 99 mg/dL  89  130   BUN 8 - 23 mg/dL  18  11   Creatinine 0.61 - 1.24 mg/dL  0.92  0.87   Sodium 135 - 145 mmol/L 137  137  134   Potassium 3.5 - 5.1 mmol/L 4.4  4.3  3.8   Chloride 98 - 111 mmol/L  107  104   CO2 22 - 32 mmol/L  21  23   Calcium 8.9 - 10.3 mg/dL  9.3  8.8     BNP No results found for: "BNP"  ProBNP No results found for: "PROBNP"  PFT No results found for: "FEV1PRE", "FEV1POST", "FVCPRE", "FVCPOST", "TLC", "DLCOUNC",  "PREFEV1FVCRT", "PSTFEV1FVCRT"  No results found.   Past medical hx Past Medical History:  Diagnosis Date   Anxiety    Arthritis    Cancer ([place])    basal cell -removed   Depression    Hx of transfusion of packed red blood cells 1974   Shortness of breath    Sleep apnea    cpap     Social History   Tobacco Use   Smoking status: Former    Packs/day: 0.50    Years: 36.00    Total pack years: 18.00    Types: Cigarettes    Start date: 57    Quit date: 06/20/2007    Years since quitting: 14.5   Smokeless tobacco: Never  Substance Use Topics   Alcohol use: Yes    Comment: socially   Drug use: Yes    Types: Marijuana    Chad Knapp reports that he quit smoking about 14 years ago. His smoking use included cigarettes. He started smoking about 51 years ago. He has a 18.00 pack-year smoking history. He has never used smokeless tobacco. He reports current alcohol use. He reports current drug use. Drug: Marijuana.  Tobacco Cessation: Former smoker with a 36 pack year smoking history Quit 2008   Past surgical hx, Family hx, Social hx all reviewed.  Current Outpatient Medications on File Prior to Visit  Medication Sig   albuterol (PROVENTIL HFA;VENTOLIN HFA) 108 (90 BASE) MCG/ACT inhaler Inhale 2 puffs into the lungs every 4 (four) hours as needed for wheezing or shortness of breath.   carbidopa-levodopa (SINEMET IR) 25-100 MG tablet Take 2.5 tablets by mouth 3 (three) times daily.   fluticasone (FLONASE) 50 MCG/ACT nasal spray Place 2 sprays into the nose daily as needed for rhinitis or allergies.   ibuprofen (ADVIL) 800 MG tablet Take 800 mg by mouth every 8 (eight) hours as needed for moderate pain.   Lactobacillus (ACIDOPHILUS/BIFIDUS PO) Take 2 tablets by mouth daily.   loratadine (ALLERGY) 10 MG tablet Take 10 mg by mouth daily. [employer] brand   losartan (COZAAR) 100 MG tablet Take 50 mg by mouth daily.   methocarbamol (ROBAXIN) 500 MG tablet Take 500 mg by mouth every  8 (eight) hours as needed for muscle spasms.   Multiple Vitamin (MULTIVITAMIN WITH MINERALS) TABS tablet Take 1 tablet by mouth daily.   pantoprazole (PROTONIX) 40 MG tablet Take 40 mg by mouth daily.   rasagiline (AZILECT) 1 MG TABS tablet TAKE ONE TABLET BY MOUTH ONCE A DAY TO SLOW PARKINSON'S DISEASE - NOT A DOSE CHANGE   tamsulosin (FLOMAX) 0.4 MG CAPS  capsule Take 0.4 mg by mouth daily.   Tiotropium Bromide-Olodaterol (STIOLTO RESPIMAT) 2.5-2.5 MCG/ACT AERS Inhale 2 puffs into the lungs daily.   No current facility-administered medications on file prior to visit.     Allergies  Allergen Reactions   Lisinopril Other (See Comments)    Review Of Systems:  Constitutional:   No  weight loss, night sweats,  Fevers, chills, fatigue, or  lassitude.  HEENT:   No headaches,  Difficulty swallowing,  Tooth/dental problems, or  Sore throat,                No sneezing, itching, ear ache, nasal congestion, post nasal drip,   CV:  No chest pain,  Orthopnea, PND, swelling in lower extremities, anasarca, dizziness, palpitations, syncope.   GI  No heartburn, indigestion, abdominal pain, nausea, vomiting, diarrhea, change in bowel habits, loss of appetite, bloody stools.   Resp: No shortness of breath with exertion or at rest.  No excess mucus, no productive cough,  No non-productive cough,  No coughing up of blood.  No change in color of mucus.  No wheezing.  No chest wall deformity  Skin: no rash or lesions.  GU: no dysuria, change in color of urine, no urgency or frequency.  No flank pain, no hematuria   MS:  No joint pain or swelling.  No decreased range of motion.  No back pain.  Psych:  No change in mood or affect. No depression or anxiety.  No memory loss.   Vital Signs BP 112/70 (BP Location: Left Arm, Patient Position: Sitting, Cuff Size: Large)   Pulse 79   Temp 97.6 F (36.4 C) (Oral)   Ht '6\' 3"'$  (1.905 m)   Wt 277 lb (125.6 kg)   SpO2 95%   BMI 34.62 kg/m    Physical  Exam:  General- No distress,  A&Ox3, pleasant ENT: No sinus tenderness, TM clear, pale nasal mucosa, no oral exudate,no post nasal drip, no LAN Cardiac: S1, S2, regular rate and rhythm, no murmur Chest: No wheeze/ rales/ dullness; no accessory muscle use, no nasal flaring, no sternal retractions Abd.: Soft Non-tender Ext: No clubbing cyanosis, edema Neuro:  normal strength Skin: No rashes, warm and dry Psych: normal mood and behavior   Assessment/Plan Mixed solid and sub solid lesion in the posterior right upper lobe measuring 1.4 x 1.2 cm with 9 mm solid component. Former smoker Plan We will schedule you for a bronchoscopy with biopsies 12/31/2021. This procedure is done in the hospital.  You have it done and usually go home the same day.  You will need a driver to take you to the hospital, and drive you home after the procedure. We discussed the rare risks of complications including infection, bleeding, and puncture of the lung. Dr. Valeta Knapp can manage any of these complications.  We have confirmed that you are not on blood thinners.  You will get a call to get this procedure scheduled.  Continue Chad Knapp 2 puffs daily as you have been doing.  Rinse mouth after use.  Continue using albuterol inhaler as you have been doing for breakthrough shortness of breath.  We will schedule PFT's after your bronch. Please schedule PFT's sometime after 12/31/2021.  You will do a follow up in the office  1 week after your procedure.  Continue CPAP machine  use as you have been doing.  Call us if you need [place] sooner.  Please contact office for sooner follow up if symptoms do not improve or worsen  or seek emergency care      I spent 40 minutes dedicated to the care of this patient on the date of this encounter to include pre-visit review of records, face-to-face time with the patient discussing conditions above, post visit ordering of testing, clinical documentation with the electronic health record,  making appropriate referrals as documented, and communicating necessary information to the patient's healthcare team.   Magdalen Spatz, NP 12/20/2021  6:00 PM

## 2021-12-20 NOTE — H&P (View-Only) (Signed)
History of Present Illness Chad Knapp is a 71 y.o. male former smoker, quit 2008, with past medical history of depression, shortness of breath, sleep apnea on CPAP, new diagnosis of Parkinson disease.  He had a recent CT chest completed at the [facility]. Patient has a dominant approximate 12 mm right upper lobe lung nodule with areas of groundglass around this. He also has complaints of shortness of breath with exertion and sputum production.He has never been formally diagnosed with COPD.   He was referred to Dr. Valeta Harms for follow up of the lung nodule.    6/30/2023Pt. Presents for follow up. He was seen by Dr. Valeta Harms 09/18/2021. At that time Dr. Valeta Harms planned for an 8 week repeat CT Chest, and started the patient on [place] as a therapeutic trial. PFT's were also ordered  to determine if the patient had COPD. The PFT's were ordered , but never scheduled. He did have the Super D CT Chest done which shows nodules similar to those noted on the previous scan, but the most concerning of which is a mixed solid and sub solid lesion in the posterior right upper lobe measuring 1.4 x 1.2 cm with 9 mm solid component.I have spoken with Dr. Valeta Harms, and he feels this needs to be biopsied. I have spoken with the patient, and he is in agreement with a bronch with biopsies . Dr. Valeta Harms can do this 12/31/2021. Pt. Is not on any blood thinners.  Pt. Has found the Stialto is helpful. He breaths better after the inhaler. He feels it is no longer working in the evening. He would like something that lasts longer. I have asked him to schedule PFT's as soon as possible. If we see there is a component of COPD we can consider triple therapy with Judithann Sauger or Trelegy  Test Results: Super D CT Chest Lungs/Pleura: Chronic elevation of the left hemidiaphragm with areas of chronic atelectasis and/or scarring in the left lung base. Mild chronic scarring/fibrosis also noted in the right lower lobe. Several well-defined solid  pulmonary nodules are again noted throughout the lungs bilaterally, stable compared to the prior study, largest of which measures 6 mm in the superior segment of the right lower lobe (axial image 75 of series 5). In addition, in the posterior aspect of the right upper lobe (axial image 55 of series 5) there is a mixed solid and sub solid lesion which measures up to 1.4 x 1.2 cm, with a solid 9 mm component (axial image 56 of series 5), very similar to the prior examination. No other new suspicious appearing pulmonary nodules or masses are noted. No acute consolidative airspace disease  Pulmonary nodules bilaterally, similar to the prior study, most concerning of which is a mixed solid and sub solid lesion in the posterior right upper lobe measuring 1.4 x 1.2 cm with 9 mm solid component. Aortic atherosclerosis, in addition to left main and 2 vessel coronary artery disease. Assessment for potential risk factor modification, dietary therapy or pharmacologic therapy may be warranted, if clinically indicated. Hepatic steatosis. Additional incidental findings, as above.      Latest Ref Rng & Units 08/30/2021    3:48 PM 08/30/2021    3:32 PM 11/07/2020    9:17 AM  CBC  WBC 4.0 - 10.5 K/uL  8.6  8.3   Hemoglobin 13.0 - 17.0 g/dL 15.6  16.4  14.5   Hematocrit 39.0 - 52.0 % 46.0  46.5  44.3   Platelets 150 - 400 K/uL  161  210        Latest Ref Rng & Units 08/30/2021    3:48 PM 08/30/2021    3:32 PM 11/07/2020    9:17 AM  BMP  Glucose 70 - 99 mg/dL  89  130   BUN 8 - 23 mg/dL  18  11   Creatinine 0.61 - 1.24 mg/dL  0.92  0.87   Sodium 135 - 145 mmol/L 137  137  134   Potassium 3.5 - 5.1 mmol/L 4.4  4.3  3.8   Chloride 98 - 111 mmol/L  107  104   CO2 22 - 32 mmol/L  21  23   Calcium 8.9 - 10.3 mg/dL  9.3  8.8     BNP No results found for: "BNP"  ProBNP No results found for: "PROBNP"  PFT No results found for: "FEV1PRE", "FEV1POST", "FVCPRE", "FVCPOST", "TLC", "DLCOUNC",  "PREFEV1FVCRT", "PSTFEV1FVCRT"  No results found.   Past medical hx Past Medical History:  Diagnosis Date   Anxiety    Arthritis    Cancer ([place])    basal cell -removed   Depression    Hx of transfusion of packed red blood cells 1974   Shortness of breath    Sleep apnea    cpap     Social History   Tobacco Use   Smoking status: Former    Packs/day: 0.50    Years: 36.00    Total pack years: 18.00    Types: Cigarettes    Start date: 64    Quit date: 06/20/2007    Years since quitting: 14.5   Smokeless tobacco: Never  Substance Use Topics   Alcohol use: Yes    Comment: socially   Drug use: Yes    Types: Marijuana    Chad Knapp reports that he quit smoking about 14 years ago. His smoking use included cigarettes. He started smoking about 51 years ago. He has a 18.00 pack-year smoking history. He has never used smokeless tobacco. He reports current alcohol use. He reports current drug use. Drug: Marijuana.  Tobacco Cessation: Former smoker with a 36 pack year smoking history Quit 2008   Past surgical hx, Family hx, Social hx all reviewed.  Current Outpatient Medications on File Prior to Visit  Medication Sig   albuterol (PROVENTIL HFA;VENTOLIN HFA) 108 (90 BASE) MCG/ACT inhaler Inhale 2 puffs into the lungs every 4 (four) hours as needed for wheezing or shortness of breath.   carbidopa-levodopa (SINEMET IR) 25-100 MG tablet Take 2.5 tablets by mouth 3 (three) times daily.   fluticasone (FLONASE) 50 MCG/ACT nasal spray Place 2 sprays into the nose daily as needed for rhinitis or allergies.   ibuprofen (ADVIL) 800 MG tablet Take 800 mg by mouth every 8 (eight) hours as needed for moderate pain.   Lactobacillus (ACIDOPHILUS/BIFIDUS PO) Take 2 tablets by mouth daily.   loratadine (ALLERGY) 10 MG tablet Take 10 mg by mouth daily. [employer] brand   losartan (COZAAR) 100 MG tablet Take 50 mg by mouth daily.   methocarbamol (ROBAXIN) 500 MG tablet Take 500 mg by mouth every  8 (eight) hours as needed for muscle spasms.   Multiple Vitamin (MULTIVITAMIN WITH MINERALS) TABS tablet Take 1 tablet by mouth daily.   pantoprazole (PROTONIX) 40 MG tablet Take 40 mg by mouth daily.   rasagiline (AZILECT) 1 MG TABS tablet TAKE ONE TABLET BY MOUTH ONCE A DAY TO SLOW PARKINSON'S DISEASE - NOT A DOSE CHANGE   tamsulosin (FLOMAX) 0.4 MG CAPS  capsule Take 0.4 mg by mouth daily.   Tiotropium Bromide-Olodaterol (STIOLTO RESPIMAT) 2.5-2.5 MCG/ACT AERS Inhale 2 puffs into the lungs daily.   No current facility-administered medications on file prior to visit.     Allergies  Allergen Reactions   Lisinopril Other (See Comments)    Review Of Systems:  Constitutional:   No  weight loss, night sweats,  Fevers, chills, fatigue, or  lassitude.  HEENT:   No headaches,  Difficulty swallowing,  Tooth/dental problems, or  Sore throat,                No sneezing, itching, ear ache, nasal congestion, post nasal drip,   CV:  No chest pain,  Orthopnea, PND, swelling in lower extremities, anasarca, dizziness, palpitations, syncope.   GI  No heartburn, indigestion, abdominal pain, nausea, vomiting, diarrhea, change in bowel habits, loss of appetite, bloody stools.   Resp: No shortness of breath with exertion or at rest.  No excess mucus, no productive cough,  No non-productive cough,  No coughing up of blood.  No change in color of mucus.  No wheezing.  No chest wall deformity  Skin: no rash or lesions.  GU: no dysuria, change in color of urine, no urgency or frequency.  No flank pain, no hematuria   MS:  No joint pain or swelling.  No decreased range of motion.  No back pain.  Psych:  No change in mood or affect. No depression or anxiety.  No memory loss.   Vital Signs BP 112/70 (BP Location: Left Arm, Patient Position: Sitting, Cuff Size: Large)   Pulse 79   Temp 97.6 F (36.4 C) (Oral)   Ht '6\' 3"'$  (1.905 m)   Wt 277 lb (125.6 kg)   SpO2 95%   BMI 34.62 kg/m    Physical  Exam:  General- No distress,  A&Ox3, pleasant ENT: No sinus tenderness, TM clear, pale nasal mucosa, no oral exudate,no post nasal drip, no LAN Cardiac: S1, S2, regular rate and rhythm, no murmur Chest: No wheeze/ rales/ dullness; no accessory muscle use, no nasal flaring, no sternal retractions Abd.: Soft Non-tender Ext: No clubbing cyanosis, edema Neuro:  normal strength Skin: No rashes, warm and dry Psych: normal mood and behavior   Assessment/Plan Mixed solid and sub solid lesion in the posterior right upper lobe measuring 1.4 x 1.2 cm with 9 mm solid component. Former smoker Plan We will schedule you for a bronchoscopy with biopsies 12/31/2021. This procedure is done in the hospital.  You have it done and usually go home the same day.  You will need a driver to take you to the hospital, and drive you home after the procedure. We discussed the rare risks of complications including infection, bleeding, and puncture of the lung. Dr. Valeta Harms can manage any of these complications.  We have confirmed that you are not on blood thinners.  You will get a call to get this procedure scheduled.  Continue Stialto 2 puffs daily as you have been doing.  Rinse mouth after use.  Continue using albuterol inhaler as you have been doing for breakthrough shortness of breath.  We will schedule PFT's after your bronch. Please schedule PFT's sometime after 12/31/2021.  You will do a follow up in the office  1 week after your procedure.  Continue CPAP machine  use as you have been doing.  Call us if you need [place] sooner.  Please contact office for sooner follow up if symptoms do not improve or worsen  or seek emergency care      I spent 40 minutes dedicated to the care of this patient on the date of this encounter to include pre-visit review of records, face-to-face time with the patient discussing conditions above, post visit ordering of testing, clinical documentation with the electronic health record,  making appropriate referrals as documented, and communicating necessary information to the patient's healthcare team.   Magdalen Spatz, NP 12/20/2021  6:00 PM

## 2021-12-21 DIAGNOSIS — U071 COVID-19: Secondary | ICD-10-CM

## 2021-12-21 HISTORY — DX: COVID-19: U07.1

## 2021-12-27 ENCOUNTER — Other Ambulatory Visit: Payer: Self-pay | Admitting: Pulmonary Disease

## 2021-12-27 LAB — SARS CORONAVIRUS 2 (TAT 6-24 HRS): SARS Coronavirus 2: POSITIVE — AB

## 2021-12-28 ENCOUNTER — Telehealth: Payer: Self-pay | Admitting: Internal Medicine

## 2021-12-28 NOTE — Telephone Encounter (Signed)
Patient called into on-call weekend hotline. His covid test pre-procedure was positive. I suspect this means his procedure may be postponed but let him know someone from endoscopy will reach out to him on Monday to reschedule.

## 2021-12-29 NOTE — Telephone Encounter (Signed)
PCCM:  We will need to reschedule his bronchoscopy. We should be able to reschedule on 01/07/2022 if the patient can do this.   Thanks  Garner Nash, DO [facility] 12/29/2021 2:46 PM

## 2021-12-30 NOTE — Telephone Encounter (Signed)
Pt has been rescheduled to 7/18 at 7:30.  I spoke to pt & gave him appt info.  Nothing further needed.

## 2021-12-31 DIAGNOSIS — R911 Solitary pulmonary nodule: Secondary | ICD-10-CM | POA: Insufficient documentation

## 2022-01-06 ENCOUNTER — Telehealth: Payer: Self-pay | Admitting: Pulmonary Disease

## 2022-01-06 ENCOUNTER — Other Ambulatory Visit: Payer: Self-pay

## 2022-01-06 ENCOUNTER — Encounter (HOSPITAL_COMMUNITY): Payer: Self-pay | Admitting: Pulmonary Disease

## 2022-01-06 NOTE — Telephone Encounter (Signed)
Info from pt's last OV with Judson Roch:  We will schedule PFT's after your bronch. Please schedule PFT's sometime after 12/31/2021.  You will do a follow up in the office  1 week after your procedure.    Pt does not have a f/u scheduled. Checked and saw that there are no openings with either Sarah or Dr. Valeta Harms next week which will be 1 week after bronch.  Next available day for appt with either Sarah or Dr. Valeta Harms is not until Tuesday, 8/8 with Sarah or Wednesday, 8/9 with Dr. Valeta Harms.  Next available day for PFT is not until Thursday, 8/10 if pt is okay having PFT appt at 4pm or on 8/16 if wanted a time other than 4pm.   Routing this to both Sarah and Dr. Valeta Harms. Please advise with what you want [place] to do/

## 2022-01-06 NOTE — Anesthesia Preprocedure Evaluation (Signed)
Anesthesia Evaluation  Patient identified by MRN, date of birth, ID band Patient awake    Reviewed: Allergy & Precautions, NPO status , Patient's Chart, lab work & pertinent test results  Airway Mallampati: II  TM Distance: >3 FB Neck ROM: Full    Dental no notable dental hx. (+) Teeth Intact, Dental Advisory Given   Pulmonary sleep apnea and Continuous Positive Airway Pressure Ventilation , former smoker,  Lund nodule   Pulmonary exam normal breath sounds clear to auscultation       Cardiovascular hypertension, Pt. on medications Normal cardiovascular exam Rhythm:Regular Rate:Normal     Neuro/Psych  Neuromuscular disease (Parkinsons)    GI/Hepatic GERD  ,  Endo/Other    Renal/GU      Musculoskeletal  (+) Arthritis ,   Abdominal   Peds  Hematology   Anesthesia Other Findings All: lisinopril  Reproductive/Obstetrics                            Anesthesia Physical Anesthesia Plan  ASA: 3  Anesthesia Plan: General   Post-op Pain Management:    Induction: Intravenous  PONV Risk Score and Plan: 2 and Treatment may vary due to age or medical condition and Midazolam  Airway Management Planned: Oral ETT  Additional Equipment: None  Intra-op Plan:   Post-operative Plan: Extubation in OR  Informed Consent: I have reviewed the patients History and Physical, chart, labs and discussed the procedure including the risks, benefits and alternatives for the proposed anesthesia with the patient or authorized representative who has indicated his/her understanding and acceptance.     Dental advisory given  Plan Discussed with: CRNA  Anesthesia Plan Comments:        Anesthesia Quick Evaluation

## 2022-01-06 NOTE — Telephone Encounter (Signed)
Magdalen Spatz, NP  You; Garner Nash, DO 4 minutes ago (11:53 AM)    OK, rescheduled for 7/18 due to + Covid test.  Raquel Sarna, they changed my Mom's pacemaker battery change to 7/19, So I have just emailed Darilyn and Mel Almond to add 7/28 am clinic back. There are openings that day now for this patient.once they open the day back up. Thanks      Routing to [place] as an FYI about getting pt scheduled once Sarah's schedule is open for 7/28.

## 2022-01-06 NOTE — Progress Notes (Signed)
Spoke with pt for pre-op call. Pt denies cardiac history or diabetes. Pt is treated for HTN and Parkinson's disease. Pt does use a Cpap every night.   Shower instructions given to pt and he voiced understanding.  Pt tested positive for Covid on 12/27/21. No repeat Covid test needed. Pt states he had no symptoms.

## 2022-01-06 NOTE — Telephone Encounter (Signed)
Patient is scheduled 01/17/2022 with Sarah for biopsy follow up and scheduled for PFT on 02/05/2022. Reminder mailed.

## 2022-01-06 NOTE — Telephone Encounter (Signed)
Patient states he received a message that states he needs to schedule an appointment- assuming that it is relayed to the biopsy scheduled tomorrow. Can we confirm if patient needs a follow up appt? How long?

## 2022-01-07 ENCOUNTER — Encounter (HOSPITAL_COMMUNITY)
Admission: RE | Disposition: A | Discharge status: Home / Self Care | Payer: Self-pay | Source: Ambulatory Visit | Attending: Pulmonary Disease

## 2022-01-07 ENCOUNTER — Ambulatory Visit (HOSPITAL_COMMUNITY): Payer: No Typology Code available for payment source

## 2022-01-07 ENCOUNTER — Ambulatory Visit (HOSPITAL_COMMUNITY): Payer: No Typology Code available for payment source | Admitting: Physician Assistant

## 2022-01-07 ENCOUNTER — Encounter (HOSPITAL_COMMUNITY): Payer: Self-pay | Admitting: Pulmonary Disease

## 2022-01-07 ENCOUNTER — Ambulatory Visit (HOSPITAL_COMMUNITY)
Admission: RE | Admit: 2022-01-07 | Discharge: 2022-01-07 | Disposition: A | Discharge status: Home / Self Care | Payer: No Typology Code available for payment source | Source: Ambulatory Visit | Attending: Pulmonary Disease | Admitting: Pulmonary Disease

## 2022-01-07 ENCOUNTER — Ambulatory Visit (HOSPITAL_BASED_OUTPATIENT_CLINIC_OR_DEPARTMENT_OTHER): Payer: No Typology Code available for payment source | Admitting: Physician Assistant

## 2022-01-07 DIAGNOSIS — G20 Parkinson's disease: Secondary | ICD-10-CM | POA: Insufficient documentation

## 2022-01-07 DIAGNOSIS — C8519 Unspecified B-cell lymphoma, extranodal and solid organ sites: Secondary | ICD-10-CM | POA: Insufficient documentation

## 2022-01-07 DIAGNOSIS — R911 Solitary pulmonary nodule: Secondary | ICD-10-CM

## 2022-01-07 DIAGNOSIS — R918 Other nonspecific abnormal finding of lung field: Secondary | ICD-10-CM | POA: Diagnosis not present

## 2022-01-07 DIAGNOSIS — G4730 Sleep apnea, unspecified: Secondary | ICD-10-CM | POA: Diagnosis not present

## 2022-01-07 DIAGNOSIS — I10 Essential (primary) hypertension: Secondary | ICD-10-CM

## 2022-01-07 DIAGNOSIS — K219 Gastro-esophageal reflux disease without esophagitis: Secondary | ICD-10-CM | POA: Diagnosis not present

## 2022-01-07 DIAGNOSIS — G4733 Obstructive sleep apnea (adult) (pediatric): Secondary | ICD-10-CM | POA: Diagnosis not present

## 2022-01-07 DIAGNOSIS — Z87891 Personal history of nicotine dependence: Secondary | ICD-10-CM

## 2022-01-07 HISTORY — DX: Personal history of urinary calculi: Z87.442

## 2022-01-07 HISTORY — DX: Essential (primary) hypertension: I10

## 2022-01-07 HISTORY — DX: Gastro-esophageal reflux disease without esophagitis: K21.9

## 2022-01-07 HISTORY — PX: BRONCHIAL NEEDLE ASPIRATION BIOPSY: SHX5106

## 2022-01-07 HISTORY — PX: BRONCHIAL BIOPSY: SHX5109

## 2022-01-07 HISTORY — DX: Pneumonia, unspecified organism: J18.9

## 2022-01-07 LAB — CBC
HCT: 44.1 % (ref 39.0–52.0)
Hemoglobin: 15.3 g/dL (ref 13.0–17.0)
MCH: 30.9 pg (ref 26.0–34.0)
MCHC: 34.7 g/dL (ref 30.0–36.0)
MCV: 89.1 fL (ref 80.0–100.0)
Platelets: 140 10*3/uL — ABNORMAL LOW (ref 150–400)
RBC: 4.95 MIL/uL (ref 4.22–5.81)
RDW: 12.4 % (ref 11.5–15.5)
WBC: 7.1 10*3/uL (ref 4.0–10.5)
nRBC: 0 % (ref 0.0–0.2)

## 2022-01-07 LAB — BASIC METABOLIC PANEL
Anion gap: 10 (ref 5–15)
BUN: 15 mg/dL (ref 8–23)
CO2: 20 mmol/L — ABNORMAL LOW (ref 22–32)
Calcium: 8.8 mg/dL — ABNORMAL LOW (ref 8.9–10.3)
Chloride: 105 mmol/L (ref 98–111)
Creatinine, Ser: 0.95 mg/dL (ref 0.61–1.24)
GFR, Estimated: >60 mL/min (ref >60)
Glucose, Bld: 96 mg/dL (ref 70–99)
Potassium: 3.8 mmol/L (ref 3.5–5.1)
Sodium: 135 mmol/L (ref 135–145)

## 2022-01-07 SURGERY — BRONCHOSCOPY, WITH BIOPSY USING ELECTROMAGNETIC NAVIGATION
Anesthesia: General | Laterality: Right

## 2022-01-07 MED ORDER — FENTANYL CITRATE (PF) 250 MCG/5ML IJ SOLN
INTRAMUSCULAR | Status: DC | PRN
Start: 2022-01-07 — End: 2022-01-07
  Administered 2022-01-07: 100 ug via INTRAVENOUS

## 2022-01-07 MED ORDER — ROCURONIUM BROMIDE 10 MG/ML (PF) SYRINGE
PREFILLED_SYRINGE | INTRAVENOUS | Status: DC | PRN
  Administered 2022-01-07: 100 mg via INTRAVENOUS

## 2022-01-07 MED ORDER — PROPOFOL 10 MG/ML IV BOLUS
INTRAVENOUS | Status: DC | PRN
  Administered 2022-01-07: 200 mg via INTRAVENOUS

## 2022-01-07 MED ORDER — DEXAMETHASONE SODIUM PHOSPHATE 10 MG/ML IJ SOLN
INTRAMUSCULAR | Status: DC | PRN
  Administered 2022-01-07: 10 mg via INTRAVENOUS

## 2022-01-07 MED ORDER — ONDANSETRON HCL 4 MG/2ML IJ SOLN
INTRAMUSCULAR | Status: DC | PRN
  Administered 2022-01-07: 4 mg via INTRAVENOUS

## 2022-01-07 MED ORDER — PHENYLEPHRINE HCL-NACL 20-0.9 MG/250ML-% IV SOLN
INTRAVENOUS | Status: DC | PRN
  Administered 2022-01-07: 25 ug/min via INTRAVENOUS

## 2022-01-07 MED ORDER — PHENYLEPHRINE 80 MCG/ML (10ML) SYRINGE FOR IV PUSH (FOR BLOOD PRESSURE SUPPORT)
PREFILLED_SYRINGE | INTRAVENOUS | Status: DC | PRN
  Administered 2022-01-07: 160 ug via INTRAVENOUS
  Administered 2022-01-07: 80 ug via INTRAVENOUS
  Administered 2022-01-07 (×2): 160 ug via INTRAVENOUS
  Administered 2022-01-07: 80 ug via INTRAVENOUS

## 2022-01-07 MED ORDER — ACETAMINOPHEN 10 MG/ML IV SOLN
1000.0000 mg | Freq: Once | INTRAVENOUS | Status: DC | PRN
  Filled 2022-01-07: qty 100

## 2022-01-07 MED ORDER — LIDOCAINE 2% (20 MG/ML) 5 ML SYRINGE
INTRAMUSCULAR | Status: DC | PRN
  Administered 2022-01-07: 100 mg via INTRAVENOUS

## 2022-01-07 MED ORDER — FENTANYL CITRATE (PF) 100 MCG/2ML IJ SOLN: 25.0000 ug | INTRAMUSCULAR | Status: DC | PRN

## 2022-01-07 MED ORDER — SUCCINYLCHOLINE CHLORIDE 200 MG/10ML IV SOSY
PREFILLED_SYRINGE | INTRAVENOUS | Status: DC | PRN
  Administered 2022-01-07: 100 mg via INTRAVENOUS

## 2022-01-07 MED ORDER — SUGAMMADEX SODIUM 200 MG/2ML IV SOLN
INTRAVENOUS | Status: DC | PRN
  Administered 2022-01-07: 200 mg via INTRAVENOUS

## 2022-01-07 MED ORDER — ONDANSETRON HCL 4 MG/2ML IJ SOLN: 4.0000 mg | Freq: Once | INTRAMUSCULAR | Status: DC | PRN

## 2022-01-07 MED ORDER — CHLORHEXIDINE GLUCONATE 0.12 % MT SOLN
OROMUCOSAL | Status: AC
  Administered 2022-01-07: 15 mL
  Filled 2022-01-07: qty 15

## 2022-01-07 MED ORDER — LACTATED RINGERS IV SOLN: INTRAVENOUS | Status: DC

## 2022-01-07 NOTE — Anesthesia Procedure Notes (Addendum)
Procedure Name: Intubation Date/Time: 01/07/2022 7:40 AM  Performed by: Kyung Rudd, CRNAPre-anesthesia Checklist: Patient identified, Emergency Drugs available, Suction available and Patient being monitored Patient Re-evaluated:Patient Re-evaluated prior to induction Oxygen Delivery Method: Circle system utilized Preoxygenation: Pre-oxygenation with 100% oxygen Induction Type: IV induction Ventilation: Mask ventilation without difficulty and Oral airway inserted - appropriate to patient size Laryngoscope Size: Mac, Glidescope and 4 Grade View: Grade II Tube type: Oral Tube size: 8.5 mm Number of attempts: 2 Airway Equipment and Method: Stylet and Oral airway Placement Confirmation: ETT inserted through vocal cords under direct vision, positive ETCO2 and breath sounds checked- equal and bilateral Secured at: 23 cm Tube secured with: Tape Dental Injury: Teeth and Oropharynx as per pre-operative assessment  Comments: DL x1 with MAC 4, unable to visualize vocal cords by SRNA. 2nd DL with GlideScope Go by MDA with Grade II view on screen. +ETCO2 & BBS=.  Chip to right upper tooth noted upon DL.

## 2022-01-07 NOTE — Anesthesia Postprocedure Evaluation (Signed)
Anesthesia Post Note  Patient: TELLY JAWAD  Procedure(s) Performed: ROBOTIC ASSISTED NAVIGATIONAL BRONCHOSCOPY (Right) BRONCHIAL BIOPSIES BRONCHIAL NEEDLE ASPIRATION BIOPSIES     Patient location during evaluation: PACU Anesthesia Type: General Level of consciousness: awake and alert Pain management: pain level controlled Vital Signs Assessment: post-procedure vital signs reviewed and stable Respiratory status: spontaneous breathing, nonlabored ventilation, respiratory function stable and patient connected to nasal cannula oxygen Cardiovascular status: blood pressure returned to baseline and stable Postop Assessment: no apparent nausea or vomiting Anesthetic complications: no   No notable events documented.  Last Vitals:  Vitals:   01/07/22 0930 01/07/22 0945  BP: (!) 140/54 138/87  Pulse: 77 74  Resp: 13 10  Temp:  36.5 C  SpO2: 93% 94%    Last Pain:  Vitals:   01/07/22 0945  TempSrc:   PainSc: 0-No pain                 Barnet Glasgow

## 2022-01-07 NOTE — Discharge Instructions (Signed)
Flexible Bronchoscopy, Care After This sheet gives you information about how to care for yourself after your test. Your doctor may also give you more specific instructions. If you have problems or questions, contact your doctor. Follow these instructions at home: Eating and drinking Do not eat or drink anything (not even water) for 2 hours after your test, or until your numbing medicine (local anesthetic) wears off. When your numbness is gone and your cough and gag reflexes have come back, you may: Eat only soft foods. Slowly drink liquids. The day after the test, go back to your normal diet. Driving Do not drive for 24 hours if you were given a medicine to help you relax (sedative). Do not drive or use heavy machinery while taking prescription pain medicine. General instructions  Take over-the-counter and prescription medicines only as told by your doctor. Return to your normal activities as told. Ask what activities are safe for you. Do not use any products that have nicotine or tobacco in them. This includes cigarettes and e-cigarettes. If you need help quitting, ask your doctor. Keep all follow-up visits as told by your doctor. This is important. It is very important if you had a tissue sample (biopsy) taken. Get help right away if: You have shortness of breath that gets worse. You get light-headed. You feel like you are going to pass out (faint). You have chest pain. You cough up: More than a little blood. More blood than before. Summary Do not eat or drink anything (not even water) for 2 hours after your test, or until your numbing medicine wears off. Do not use cigarettes. Do not use e-cigarettes. Get help right away if you have chest pain.  This information is not intended to replace advice given to you by your health care provider. Make sure you discuss any questions you have with your health care provider. Document Released: 04/06/2009 Document Revised: 05/22/2017 Document  Reviewed: 06/27/2016 Elsevier Patient Education  2020 [employer].

## 2022-01-07 NOTE — Transfer of Care (Signed)
Immediate Anesthesia Transfer of Care Note  Patient: Chad Knapp  Procedure(s) Performed: ROBOTIC ASSISTED NAVIGATIONAL BRONCHOSCOPY (Right) BRONCHIAL BIOPSIES BRONCHIAL NEEDLE ASPIRATION BIOPSIES  Patient Location: PACU  Anesthesia Type:General  Level of Consciousness: awake, alert  and oriented  Airway & Oxygen Therapy: Patient Spontanous Breathing  Post-op Assessment: Report given to RN, Post -op Vital signs reviewed and stable and Patient moving all extremities X 4  Post vital signs: Reviewed and stable  Last Vitals:  Vitals Value Taken Time  BP 145/79 01/07/22 0918  Temp    Pulse 77 01/07/22 0919  Resp 14 01/07/22 0919  SpO2 93 % 01/07/22 0919  Vitals shown include unvalidated device data.  Last Pain:  Vitals:   01/07/22 0643  TempSrc:   PainSc: 0-No pain         Complications: No notable events documented.

## 2022-01-07 NOTE — Interval H&P Note (Signed)
History and Physical Interval Note:  01/07/2022 7:23 AM  Chad Knapp  has presented today for surgery, with the diagnosis of lung nodule.  The various methods of treatment have been discussed with the patient and family. After consideration of risks, benefits and other options for treatment, the patient has consented to  Procedure(s) with comments: ROBOTIC ASSISTED NAVIGATIONAL BRONCHOSCOPY (Right) - ION w/ CIOS as a surgical intervention.  The patient's history has been reviewed, patient examined, no change in status, stable for surgery.  I have reviewed the patient's chart and labs.  Questions were answered to the patient's satisfaction.     [place]

## 2022-01-07 NOTE — Op Note (Addendum)
Video Bronchoscopy with Robotic Assisted Bronchoscopic Navigation   Date of Operation: 01/07/2022   Pre-op Diagnosis: Right upper lobe groundglass lesion  Post-op Diagnosis: Right upper lobe groundglass lesion  Surgeon: Garner Nash, DO  Assistants: None   Anesthesia: General endotracheal anesthesia  Operation: Flexible video fiberoptic bronchoscopy with robotic assistance and biopsies.  Estimated Blood Loss: Minimal  Complications: None  Indications and History: Chad Knapp is a 71 y.o. male with history of right upper lobe groundglass lesion. The risks, benefits, complications, treatment options and expected outcomes were discussed with the patient.  The possibilities of pneumothorax, pneumonia, reaction to medication, pulmonary aspiration, perforation of a viscus, bleeding, failure to diagnose a condition and creating a complication requiring transfusion or operation were discussed with the patient who freely signed the consent.    Description of Procedure: The patient was seen in the Preoperative Area, was examined and was deemed appropriate to proceed.  The patient was taken to [facility] endoscopy room 3, identified as Chad Knapp and the procedure verified as Flexible Video Fiberoptic Bronchoscopy.  A Time Out was held and the above information confirmed.   Prior to the date of the procedure a high-resolution CT scan of the chest was performed. Utilizing ION software program a virtual tracheobronchial tree was generated to allow the creation of distinct navigation pathways to the patient's parenchymal abnormalities. After being taken to the operating room general anesthesia was initiated and the patient  was orally intubated. The video fiberoptic bronchoscope was introduced via the endotracheal tube and a general inspection was performed which showed normal right and left lung anatomy, aspiration of the bilateral mainstems was completed to remove any remaining secretions. Robotic  catheter inserted into patient's endotracheal tube.   Target #1 right upper lobe groundglass lesion: The distinct navigation pathways prepared prior to this procedure were then utilized to navigate to patient's lesion identified on CT scan. The robotic catheter was secured into place and the vision probe was withdrawn.  Lesion location was approximated using fluoroscopy and three-dimensional cone beam CT imaging for peripheral targeting.  After the initial 3D image there was a significant amount of posterior atelectasis.  Talking with the anesthesia staff there was a good amount of time taken/needed for endotracheal intubation.  The groundglass lesion of concern was unable to be visualized in the CT imaging in the room.  We therefore undocked the robotic system placed the patient in the left lateral decubitus completed recruitment breaths, redocked, reregistered the system and started the procedure again.  The initial repeat three-dimensional CT imaging showed resolution of the atelectasis and we were able to identify that the lesion under image guidance.  Under fluoroscopic guidance transbronchial needle brushings, transbronchial needle biopsies, and transbronchial forceps biopsies were performed to be sent for cytology and pathology.   At the end of the procedure a general airway inspection was performed and there was no evidence of active bleeding. The bronchoscope was removed.  The patient tolerated the procedure well. There was no significant blood loss and there were no obvious complications. A post-procedural chest x-ray is pending.  Samples Target #1: 1. Transbronchial Wang needle biopsies from right upper lobe 2. Transbronchial forceps biopsies from right upper lobe  Plans:  The patient will be discharged from the PACU to home when recovered from anesthesia and after chest x-ray is reviewed. We will review the cytology, pathology results with the patient when they become available. Outpatient  followup will be with Garner Nash, DO.  Leory Knapp  [name], DO [facility] 01/07/2022 9:07 AM

## 2022-01-07 NOTE — Progress Notes (Signed)
Patient up to recliner chair alert talkative awaiting spouse to pick up to transport home

## 2022-01-10 LAB — SURGICAL PATHOLOGY

## 2022-01-10 LAB — CYTOLOGY - NON PAP
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
## 2022-01-14 ENCOUNTER — Telehealth: Payer: Self-pay | Admitting: Pulmonary Disease

## 2022-01-17 ENCOUNTER — Ambulatory Visit (INDEPENDENT_AMBULATORY_CARE_PROVIDER_SITE_OTHER): Payer: No Typology Code available for payment source | Admitting: Acute Care

## 2022-01-17 ENCOUNTER — Encounter: Payer: Self-pay | Admitting: Acute Care

## 2022-01-17 ENCOUNTER — Telehealth: Payer: Self-pay | Admitting: Hematology

## 2022-01-17 VITALS — BP 122/78 | HR 97 | Temp 97.8°F | Ht 75.0 in | Wt 275.0 lb

## 2022-01-17 DIAGNOSIS — Z87891 Personal history of nicotine dependence: Secondary | ICD-10-CM

## 2022-01-17 DIAGNOSIS — J449 Chronic obstructive pulmonary disease, unspecified: Secondary | ICD-10-CM

## 2022-01-17 DIAGNOSIS — C884 Extranodal marginal zone B-cell lymphoma of mucosa-associated lymphoid tissue [MALT-lymphoma]: Secondary | ICD-10-CM

## 2022-01-17 MED ORDER — STIOLTO RESPIMAT 2.5-2.5 MCG/ACT IN AERS: 2.0000 | INHALATION_SPRAY | Freq: Every day | RESPIRATORY_TRACT | 6 refills | Status: AC

## 2022-01-17 MED ORDER — BREZTRI AEROSPHERE 160-9-4.8 MCG/ACT IN AERO: 2.0000 | INHALATION_SPRAY | Freq: Two times a day (BID) | RESPIRATORY_TRACT | 0 refills | Status: DC

## 2022-01-17 NOTE — Patient Instructions (Addendum)
It is good to see you today. PFT's as ordered and scheduled for 8/16 at 9 am. We will refer to Thoracic Surgery to be evaluated for resection of that nodule.  . We will refer you to Medical Oncology to be thorough.  We will do a therapeutic trial of Breztri 2 puffs in the morning, 2 puffs in the evening and rinse mouth after use. This will replace your Stialto.   You will continue to use your albuterol as needed.  If you like this medication , let us know and we will send in a prescription  If you do not like it, please stop using and resume Stialto.  Stay active.  This is always good for your lungs Follow up after PFT's to re-evaluate inhaler.>> 2 months with Judson Roch NP Let us know if you need anything. Please sign a release for [place] to send information about your new diagnosis to the [facility].  Dr.Vannesse Parachuri We will send this office visit note to the [place].  Please contact office for sooner follow up if symptoms do not improve or worsen or seek emergency care

## 2022-01-17 NOTE — Telephone Encounter (Signed)
Scheduled appt per 7/28 referral. Pt is aware of appt date and time. Pt is aware to arrive 15 mins prior to appt time and to bring and updated insurance card. Pt is aware of appt location.   

## 2022-01-24 NOTE — Telephone Encounter (Signed)
Stiolto refilled on 7/28 to the [facility].

## 2022-01-28 NOTE — Telephone Encounter (Signed)
Will forward to Eric Form, NP, as an Juluis Rainier regarding pt's inhaler.

## 2022-02-05 ENCOUNTER — Ambulatory Visit (INDEPENDENT_AMBULATORY_CARE_PROVIDER_SITE_OTHER): Payer: No Typology Code available for payment source | Admitting: Pulmonary Disease

## 2022-02-05 DIAGNOSIS — R911 Solitary pulmonary nodule: Secondary | ICD-10-CM

## 2022-02-05 DIAGNOSIS — Z720 Tobacco use: Secondary | ICD-10-CM

## 2022-02-05 DIAGNOSIS — R0602 Shortness of breath: Secondary | ICD-10-CM

## 2022-02-05 LAB — PULMONARY FUNCTION TEST
DL/VA % pred: 86 %
DL/VA: 3.41 ml/min/mmHg/L
DLCO cor % pred: 72 %
DLCO cor: 22.37 ml/min/mmHg
DLCO unc % pred: 74 %
DLCO unc: 22.8 ml/min/mmHg
FEF 25-75 Post: 1.51 L/sec
FEF 25-75 Pre: 1.55 L/sec
FEF2575-%Change-Post: -2 %
FEF2575-%Pred-Post: 50 %
FEF2575-%Pred-Pre: 51 %
FEV1-%Change-Post: -21 %
FEV1-%Pred-Post: 44 %
FEV1-%Pred-Pre: 56 %
FEV1-Post: 1.78 L
FEV1-Pre: 2.26 L
FEV1FVC-%Change-Post: -21 %
FEV1FVC-%Pred-Pre: 73 %
FEV6-%Change-Post: -1 %
FEV6-%Pred-Post: 79 %
FEV6-%Pred-Pre: 81 %
FEV6-Post: 4.13 L
FEV6-Pre: 4.18 L
FEV6FVC-%Pred-Post: 105 %
FEV6FVC-%Pred-Pre: 105 %
FVC-%Change-Post: 0 %
FVC-%Pred-Post: 77 %
FVC-%Pred-Pre: 76 %
FVC-Post: 4.19 L
FVC-Pre: 4.18 L
Post FEV1/FVC ratio: 42 %
Post FEV6/FVC ratio: 100 %
Pre FEV1/FVC ratio: 54 %
Pre FEV6/FVC Ratio: 100 %
RV % pred: 102 %
RV: 2.87 L
TLC % pred: 91 %
TLC: 7.53 L

## 2022-02-05 NOTE — Progress Notes (Signed)
Full PFT performed today. °

## 2022-02-05 NOTE — Patient Instructions (Signed)
Full PFT performed today. °

## 2022-02-06 NOTE — Progress Notes (Signed)
[address]       [place],Chad Knapp             [phone]                    Rachael B Aven[facility] [place] Medical Record #[id number] Date of Birth: 04-Sep-1950  Referring: Magdalen Spatz, NP Primary Care: [facility] Primary Cardiologist: None  Chief Complaint:    Chief Complaint  Patient presents with   Lung Lesion    Surgical consult, Bronch with BX 01/07/22/ Chest CT 11/20/21/ PFT's 02/05/22    History of Present Illness:    Chad Knapp 71 y.o. male presents for surgical evaluation of a right upper lobe biopsy-proven MALToma.  He is a former smoker who quit in 2008, and is referred by Dr. Valeta Harms after navigational bronchoscopy.  He also has a history of Parkinson's disease and is referred by the [place].  He does have a history of shortness of breath.      Zubrod Score: At the time of surgery this patient's most appropriate activity status/level should be described as: '[]'$     0    Normal activity, no symptoms '[x]'$     1    Restricted in physical strenuous activity but ambulatory, able to do out light work '[]'$     2    Ambulatory and capable of self care, unable to do work activities, up and about               >50 % of waking hours                              '[]'$     3    Only limited self care, in bed greater than 50% of waking hours '[]'$     4    Completely disabled, no self care, confined to bed or chair '[]'$     5    Moribund   Past Medical History:  Diagnosis Date   Anxiety    Arthritis    Cancer ([place])    basal cell -removed - back, shoulder and chest, squamous cell on nose - removed   COVID 12/2021   no symptoms per pt.   Depression    GERD (gastroesophageal reflux disease)    History of kidney stones    Hx of transfusion of packed red blood cells 06/23/1972   Hypertension    Pneumonia    Shortness of breath    Sleep apnea    cpap    Past Surgical History:  Procedure Laterality Date   ANTERIOR CERVICAL DECOMP/DISCECTOMY FUSION  N/A 02/03/2013   Procedure: ACDF C6 - 7     1 LEVEL;  Surgeon: Melina Schools, MD;  Location: [facility];  Service: Orthopedics;  Laterality: N/A;   APPENDECTOMY     BASAL CELL CARCINOMA EXCISION     BRONCHIAL BIOPSY  01/07/2022   Procedure: BRONCHIAL BIOPSIES;  Surgeon: Garner Nash, DO;  Location: [facility];  Service: Pulmonary;;   BRONCHIAL NEEDLE ASPIRATION BIOPSY  01/07/2022   Procedure: BRONCHIAL NEEDLE ASPIRATION BIOPSIES;  Surgeon: Garner Nash, DO;  [facility];  Service: Pulmonary;;   CERVICAL SPINE SURGERY  02/03/2013   Dr Rolena Infante   FRACTURE SURGERY Left    tib/fib/femur   TESTICLE REMOVAL Left    TONSILLECTOMY      Family History  Problem Relation Age of Onset  Mental illness Mother      Social History   Tobacco Use  Smoking Status Former   Packs/day: 0.50   Years: 36.00   Total pack years: 18.00   Types: Cigarettes   Start date: 60   Quit date: 06/20/2007   Years since quitting: 14.6  Smokeless Tobacco Never    Social History   Substance and Sexual Activity  Alcohol Use Yes   Comment: maybe 2 beers a week     Allergies  Allergen Reactions   Lisinopril Other (See Comments)    Current Outpatient Medications  Medication Sig Dispense Refill   acetaminophen (TYLENOL) 500 MG tablet Take 1,000 mg by mouth every 6 (six) hours as needed for moderate pain.     albuterol (PROVENTIL HFA;VENTOLIN HFA) 108 (90 BASE) MCG/ACT inhaler Inhale 2 puffs into the lungs every 4 (four) hours as needed for wheezing or shortness of breath.     camphor-menthol (SARNA) lotion Apply 1 Application topically as needed for itching.     carbidopa-levodopa (SINEMET IR) 25-100 MG tablet Take 2.5 tablets by mouth 3 (three) times daily.     fluticasone (FLONASE) 50 MCG/ACT nasal spray Place 2 sprays into the nose daily as needed for rhinitis or allergies.     gabapentin (NEURONTIN) 100 MG capsule Take 200 mg by mouth 3 (three) times daily.     ibuprofen (ADVIL) 800 MG  tablet Take 800 mg by mouth every 8 (eight) hours as needed for moderate pain.     Lactobacillus (ACIDOPHILUS/BIFIDUS PO) Take 2 tablets by mouth daily.     loratadine (ALLERGY) 10 MG tablet Take 10 mg by mouth daily. [employer] brand     losartan (COZAAR) 100 MG tablet Take 50 mg by mouth daily.     methocarbamol (ROBAXIN) 500 MG tablet Take 500 mg by mouth every 8 (eight) hours as needed for muscle spasms.     Multiple Vitamin (MULTIVITAMIN WITH MINERALS) TABS tablet Take 1 tablet by mouth daily.     Omega-3 Fatty Acids (OMEGA 3 PO) Take 600 mg by mouth daily.     pantoprazole (PROTONIX) 40 MG tablet Take 40 mg by mouth daily.     propranolol (INDERAL) 20 MG tablet Take 20 mg by mouth 3 (three) times daily.     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     Tiotropium Bromide-Olodaterol (STIOLTO RESPIMAT) 2.5-2.5 MCG/ACT AERS Inhale 2 puffs into the lungs daily. 4 g 2   Tiotropium Bromide-Olodaterol (STIOLTO RESPIMAT) 2.5-2.5 MCG/ACT AERS Inhale 2 puffs into the lungs daily. 1 each 6   venlafaxine XR (EFFEXOR-XR) 37.5 MG 24 hr capsule Take 37.5 mg by mouth daily.     Budeson-Glycopyrrol-Formoterol (BREZTRI AEROSPHERE) 160-9-4.8 MCG/ACT AERO Inhale 2 puffs into the lungs in the morning and at bedtime. 10.7 g 0   No current facility-administered medications for this visit.    Review of Systems  Constitutional:  Negative for fever, malaise/fatigue and weight loss.  Respiratory:  Positive for shortness of breath. Negative for cough.   Cardiovascular:  Negative for chest pain.  Musculoskeletal:  Positive for back pain, joint pain and myalgias.  Neurological: Negative.      PHYSICAL EXAMINATION: BP 106/78   Pulse 75   Resp 20   Ht '6\' 3"'$  (1.905 m)   Wt 275  lb (124.7 kg)   SpO2 96% Comment: RA  BMI 34.37 kg/m  Physical  Exam Constitutional:      General: He is not in acute distress.    Appearance: Normal appearance. He is not ill-appearing.  HENT:     Head: Normocephalic and atraumatic.  Eyes:     Extraocular Movements: Extraocular movements intact.  Cardiovascular:     Rate and Rhythm: Normal rate.  Pulmonary:     Effort: Pulmonary effort is normal. No respiratory distress.  Abdominal:     General: There is no distension.  Musculoskeletal:     Cervical back: Normal range of motion.     Comments: Walks with a cane  Skin:    General: Skin is warm and dry.  Neurological:     General: No focal deficit present.     Mental Status: He is alert and oriented to person, place, and time.     Diagnostic Studies & Laboratory data:     Recent Radiology Findings:   No results found.     I have independently reviewed the above radiology studies  and reviewed the findings with the patient.   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  He has multiple lesions on the right side most of which appear benign.  I discussed this case with Dr. Julien Nordmann and given the size, both agree that observation would be an ideal course of action.  Based on its location it would be challenging to perform a wedge resection, thus he likely would need an upper lobectomy.  He will continue to follow-up with serial imaging, and has an appointment with medical oncology for  further discussion.      I  spent 40 minutes with  the patient face to face in counseling and coordination of care.    Lajuana Matte 02/07/2022 1:43 PM

## 2022-02-07 ENCOUNTER — Institutional Professional Consult (permissible substitution) (INDEPENDENT_AMBULATORY_CARE_PROVIDER_SITE_OTHER): Payer: No Typology Code available for payment source | Admitting: Thoracic Surgery (Cardiothoracic Vascular Surgery)

## 2022-02-07 VITALS — BP 106/78 | HR 75 | Resp 20 | Ht 75.0 in | Wt 275.0 lb

## 2022-02-07 DIAGNOSIS — R911 Solitary pulmonary nodule: Secondary | ICD-10-CM | POA: Diagnosis not present

## 2022-02-11 ENCOUNTER — Other Ambulatory Visit: Payer: Self-pay

## 2022-02-11 ENCOUNTER — Inpatient Hospital Stay: Payer: No Typology Code available for payment source | Attending: Hematology | Admitting: Hematology

## 2022-02-11 VITALS — BP 123/80 | HR 77 | Temp 97.7°F | Resp 20 | Ht 75.0 in | Wt 276.6 lb

## 2022-02-11 DIAGNOSIS — Z87891 Personal history of nicotine dependence: Secondary | ICD-10-CM | POA: Diagnosis not present

## 2022-02-11 DIAGNOSIS — C884 Extranodal marginal zone B-cell lymphoma of mucosa-associated lymphoid tissue [MALT-lymphoma]: Secondary | ICD-10-CM | POA: Diagnosis present

## 2022-02-11 DIAGNOSIS — I10 Essential (primary) hypertension: Secondary | ICD-10-CM | POA: Insufficient documentation

## 2022-02-11 DIAGNOSIS — C8580 Other specified types of non-Hodgkin lymphoma, unspecified site: Secondary | ICD-10-CM

## 2022-02-11 NOTE — Progress Notes (Signed)
HEMATOLOGY/ONCOLOGY CONSULTATION NOTE  Date of Service: 02/11/2022  Patient Care Team: [facility] as PCP - General  CHIEF COMPLAINTS/PURPOSE OF CONSULTATION:  Evaluation and consultation for extranodal pulmonary marginal zone lymphoma.  HISTORY OF PRESENTING ILLNESS:   Chad Knapp is a wonderful 71 y.o. male who has been referred to [place] by Chad Knapp, [facility] for evaluation and consultation of extranodal pulmonary marginal zone lymphoma. He reports He is doing well with no new symptoms or concerns.  He notes history of SOB for about 5-6 years.   History of smoking for 37 years previously. He smoked a pack over 3-4 days. He notes he quit smoking 06/20/2007.  We discussed getting a full body PET scan for further evaluation which he is agreeable to.  He uses a CPAP that he has had for 10 years.   He notes that he has been attempting to lose weight.   We discussed getting baseline labs today for further evaluation which he is agreeable to.  No history of cardiovascular issues.   Previous COVID-19 infection 12/2021 which he notes he experienced no obvious signs or symptoms from.  We discussed that we might take an active surveillance approach but that is to be determined based on labs and PET/CT.  No fever, chills, night sweats. No new lumps, bumps, or lesions/rashes. No abdominal pain or change in bowel habits. No new or unexpected weight loss. No SOB or chest pain. No other new or acute focal symptoms.  We discussed his recent CT chest wo contrast done 11/20/2021.  MEDICAL HISTORY:  Past Medical History:  Diagnosis Date   Anxiety    Arthritis    [place]    basal cell -removed - back, shoulder and chest, squamous cell on nose - removed   COVID 12/2021   no symptoms per pt.   Depression    GERD (gastroesophageal reflux disease)    History of kidney stones    Hx of transfusion of packed red blood cells 06/23/1972   Hypertension     Pneumonia    Shortness of breath    Sleep apnea    cpap    SURGICAL HISTORY: Past Surgical History:  Procedure Laterality Date   ANTERIOR CERVICAL DECOMP/DISCECTOMY FUSION N/A 02/03/2013   Procedure: ACDF C6 - 7     1 LEVEL;  Surgeon: Melina Schools, MD;  Location: [facility];  Service: Orthopedics;  Laterality: N/A;   APPENDECTOMY     BASAL CELL CARCINOMA EXCISION     BRONCHIAL BIOPSY  01/07/2022   Procedure: BRONCHIAL BIOPSIES;  Surgeon: Garner Nash, DO;  Location: [facility];  Service: Pulmonary;;   BRONCHIAL NEEDLE ASPIRATION BIOPSY  01/07/2022   Procedure: BRONCHIAL NEEDLE ASPIRATION BIOPSIES;  Surgeon: Garner Nash, DO;  [facility];  Service: Pulmonary;;   CERVICAL SPINE SURGERY  02/03/2013   Chad Rolena Infante   FRACTURE SURGERY Left    tib/fib/femur   TESTICLE REMOVAL Left    TONSILLECTOMY      SOCIAL HISTORY: Social History   Socioeconomic History   Marital status: Married    Spouse name: Not on file   Number of children: Not on file   Years of education: Not on file   Highest education level: Not on file  Occupational History   Not on file  Tobacco Use   Smoking status: Former    Packs/day: 0.50    Years: 36.00    Total pack years: 18.00    Types: Cigarettes  Start date: 89    Quit date: 06/20/2007    Years since quitting: 14.6   Smokeless tobacco: Never  Vaping Use   Vaping Use: Never used  Substance and Sexual Activity   Alcohol use: Yes    Comment: maybe 2 beers a week   Drug use: Not Currently    Types: Marijuana    Comment: 01/06/22 - none for 6 months   Sexual activity: Yes  Other Topics Concern   Not on file  Social History Narrative   Not on file   Social Determinants of Health   Financial Resource Strain: Not on file  Food Insecurity: Not on file  Transportation Needs: Not on file  Physical Activity: Not on file  Stress: Not on file  Social Connections: Not on file  Intimate Partner Violence: Not on file    FAMILY  HISTORY: Family History  Problem Relation Age of Onset   Mental illness Mother     ALLERGIES:  is allergic to lisinopril.  MEDICATIONS:  Current Outpatient Medications  Medication Sig Dispense Refill   acetaminophen (TYLENOL) 500 MG tablet Take 1,000 mg by mouth every 6 (six) hours as needed for moderate pain.     albuterol (PROVENTIL HFA;VENTOLIN HFA) 108 (90 BASE) MCG/ACT inhaler Inhale 2 puffs into the lungs every 4 (four) hours as needed for wheezing or shortness of breath.     Budeson-Glycopyrrol-Formoterol (BREZTRI AEROSPHERE) 160-9-4.8 MCG/ACT AERO Inhale 2 puffs into the lungs in the morning and at bedtime. 10.7 g 0   camphor-menthol (SARNA) lotion Apply 1 Application topically as needed for itching.     carbidopa-levodopa (SINEMET IR) 25-100 MG tablet Take 2.5 tablets by mouth 3 (three) times daily.     fluticasone (FLONASE) 50 MCG/ACT nasal spray Place 2 sprays into the nose daily as needed for rhinitis or allergies.     gabapentin (NEURONTIN) 100 MG capsule Take 200 mg by mouth 3 (three) times daily.     ibuprofen (ADVIL) 800 MG tablet Take 800 mg by mouth every 8 (eight) hours as needed for moderate pain.     Lactobacillus (ACIDOPHILUS/BIFIDUS PO) Take 2 tablets by mouth daily.     loratadine (ALLERGY) 10 MG tablet Take 10 mg by mouth daily. [employer] brand     losartan (COZAAR) 100 MG tablet Take 50 mg by mouth daily.     methocarbamol (ROBAXIN) 500 MG tablet Take 500 mg by mouth every 8 (eight) hours as needed for muscle spasms.     Multiple Vitamin (MULTIVITAMIN WITH MINERALS) TABS tablet Take 1 tablet by mouth daily.     Omega-3 Fatty Acids (OMEGA 3 PO) Take 600 mg by mouth daily.     pantoprazole (PROTONIX) 40 MG tablet Take 40 mg by mouth daily.     propranolol (INDERAL) 20 MG tablet Take 20 mg by mouth 3 (three) times daily.     rasagiline (AZILECT) 1 MG TABS tablet TAKE ONE TABLET BY MOUTH ONCE A DAY TO SLOW PARKINSON'S DISEASE - NOT A DOSE CHANGE     tamsulosin  (FLOMAX) 0.4 MG CAPS capsule Take 0.4 mg by mouth daily.     Tiotropium Bromide-Olodaterol (STIOLTO RESPIMAT) 2.5-2.5 MCG/ACT AERS Inhale 2 puffs into the lungs daily. 4 g 2   Tiotropium Bromide-Olodaterol (STIOLTO RESPIMAT) 2.5-2.5 MCG/ACT AERS Inhale 2 puffs into the lungs daily. 1 each 6   venlafaxine XR (EFFEXOR-XR) 37.5 MG 24 hr capsule Take 37.5 mg by mouth daily.     No current facility-administered medications for  this visit.    REVIEW OF SYSTEMS:    10 Point review of Systems was done is negative except as noted above.  PHYSICAL EXAMINATION: ECOG PERFORMANCE STATUS: 2 - Symptomatic, <50% confined to bed  . Vitals:   02/11/22 1100  BP: 123/80  Pulse: 77  Resp: 20  Temp: 97.7 F (36.5 C)  SpO2: 97%   Filed Weights   02/11/22 1100  Weight: 276 lb 9.6 oz (125.5 kg)   .Body mass index is 34.57 kg/m. NAD GENERAL:alert, in no acute distress and comfortable SKIN: no acute rashes, no significant lesions EYES: conjunctiva are pink and non-injected, sclera anicteric NECK: supple, no JVD LYMPH:  no palpable lymphadenopathy in the cervical, axillary or inguinal regions LUNGS: clear to auscultation b/l with normal respiratory effort HEART: regular rate & rhythm ABDOMEN:  normoactive bowel sounds , non tender, not distended. Extremity: no pedal edema PSYCH: alert & oriented x 3 with fluent speech NEURO: no focal motor/sensory deficits  LABORATORY DATA:  I have reviewed the data as listed  .    Latest Ref Rng & Units 01/07/2022    6:35 AM 08/30/2021    3:48 PM 08/30/2021    3:32 PM  CBC  WBC 4.0 - 10.5 K/uL 7.1   8.6   Hemoglobin 13.0 - 17.0 g/dL 15.3  15.6  16.4   Hematocrit 39.0 - 52.0 % 44.1  46.0  46.5   Platelets 150 - 400 K/uL 140   161    .    Latest Ref Rng & Units 01/07/2022    6:35 AM 08/30/2021    3:48 PM 08/30/2021    3:32 PM  CMP  Glucose 70 - 99 mg/dL 96   89   BUN 8 - 23 mg/dL 15   18   Creatinine 0.61 - 1.24 mg/dL 0.95   0.92   Sodium 135 - 145  mmol/L 135  137  137   Potassium 3.5 - 5.1 mmol/L 3.8  4.4  4.3   Chloride 98 - 111 mmol/L 105   107   CO2 22 - 32 mmol/L 20   21   Calcium 8.9 - 10.3 mg/dL 8.8   9.3   Total Protein 6.5 - 8.1 g/dL   7.3   Total Bilirubin 0.3 - 1.2 mg/dL   0.7   Alkaline Phos 38 - 126 U/L   61   AST 15 - 41 U/L   20   ALT 0 - 44 U/L   9     RADIOGRAPHIC STUDIES: I have personally reviewed the radiological images as listed and agreed with the findings in the report. No results found.  CT chest wo contrast ([id number]) done 11/20/2021 revealed "1. Pulmonary nodules bilaterally, similar to the prior study, most concerning of which is a mixed solid and sub solid lesion in the posterior right upper lobe measuring 1.4 x 1.2 cm with 9 mm solid component. 2. Aortic atherosclerosis, in addition to left main and 2 vessel coronary artery disease. Assessment for potential risk factor modification, dietary therapy or pharmacologic therapy may be warranted, if clinically indicated. 3. Hepatic steatosis. 4. Additional incidental findings, as above. Aortic Atherosclerosis (ICD10-I70.0)."  ASSESSMENT & PLAN:   71 y.o. very pleasant male with   1. Extranodal pulmonary marginal zone lymphoma. We discussed that we might take an active surveillance approach but that is to be determined based on labs and PET/CT.  Plan -I discussed available labs with the patient in details -Patient's chart reviewed in detail -  We discussed the surgical pathology results from 01/07/2022 showing relative abundance of small lymphoid cells with small fragments. Shows a monoclonal lambda-restricted B-cell population expressing B-cell antigens. Overall, findings are consistent with  -We discussed the diagnosis of extranodal pulmonary marginal zone lymphoma, the natural history, staging, initial evaluation, common symptoms, treatment options  -We will get baseline labs today to evaluate his current status further and for treatment planning. -We  discussed his recent CT chest wo contrast done 11/20/2021 which was discussed as noted above.  Follow up: Labs in 1-2 weeks PET/CT in 1-2 weeks Phone visit with Chad Irene Limbo in 3 weeks  .The total time spent in the appointment was 60 minutes* .  All of the patient's questions were answered with apparent satisfaction. The patient knows to call the clinic with any problems, questions or concerns.   Sullivan Lone MD MS AAHIVMS [facility] Hematology/Oncology Physician [facility]  .*Total Encounter Time as defined by the Centers for Medicare and Medicaid Services includes, in addition to the face-to-face time of a patient visit (documented in the note above) non-face-to-face time: obtaining and reviewing outside history, ordering and reviewing medications, tests or procedures, care coordination (communications with other health care professionals or caregivers) and documentation in the medical record.   I, Melene Muller, am acting as scribe for Fabienne Bruns, MD.   have reviewed the above documentation for accuracy and completeness, and I agree with the above.  Brunetta Genera MD

## 2022-02-12 ENCOUNTER — Encounter: Payer: Self-pay | Admitting: Pulmonary Disease

## 2022-02-12 NOTE — Telephone Encounter (Signed)
Mychart message sent by pt: Chad Ridges "Britt"  P [facility] (supporting Chad Graves Icard, DO) 25 minutes ago (1:30 PM)    I have been informed by the Chad Knapp that my authorization will expire 03/11/22. I have a follow up appt with Chad Knapp on 9\28. So that the Chad Knapp will pay for this visit you will need to contact Chad Knapp and request additional services authorization thru them. Their # is [phone] ext [phone] or look at the original packet for a Knapp #. If you need further info please contact me at [phone] or email ([email or web address]) or thru mychart. Also Chad authorization is required if further referrals occur for the team.   Thanks Chad Knapp, please advise on this.

## 2022-02-13 ENCOUNTER — Telehealth: Payer: Self-pay | Admitting: Hematology

## 2022-02-13 NOTE — Telephone Encounter (Signed)
Scheduled follow-up appointments per 8/22 los. Patient is aware.

## 2022-02-14 ENCOUNTER — Ambulatory Visit
Admission: EM | Admit: 2022-02-14 | Discharge: 2022-02-14 | Disposition: A | Discharge status: Home / Self Care | Payer: No Typology Code available for payment source | Attending: Physician Assistant | Admitting: Physician Assistant

## 2022-02-14 DIAGNOSIS — H1032 Unspecified acute conjunctivitis, left eye: Secondary | ICD-10-CM | POA: Diagnosis not present

## 2022-02-14 DIAGNOSIS — H5789 Other specified disorders of eye and adnexa: Secondary | ICD-10-CM | POA: Diagnosis not present

## 2022-02-14 MED ORDER — OFLOXACIN 0.3 % OP SOLN: 1.0000 [drp] | Freq: Four times a day (QID) | OPHTHALMIC | 0 refills | Status: DC

## 2022-02-14 NOTE — ED Triage Notes (Signed)
Pt c/o left conjunctivitis onset yesterday evening after doing yard work. Concerned for a surface scratch.

## 2022-02-14 NOTE — ED Provider Notes (Signed)
[name]    CSN: [id number] Arrival date & time: 02/14/22  7342      History   Chief Complaint Chief Complaint  Patient presents with   Conjunctivitis    HPI Chad Knapp is a 71 y.o. male.   Patient presents today with a 24-hour history of left eye irritation.  Reports that he was working out in the yard but denies any specific ocular injury.  He does report a foreign body sensation and is concerned that he might have scratched his eye.  He reports redness but denies any visual disturbance, photophobia, headache, fever, nausea, vomiting.  He does wear glasses but does not wear contacts.  He has not tried any over-the-counter medication for symptom management.  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  Denies any recent illness or additional symptoms including cough, congestion, fever, nausea, vomiting.    Past Medical History:  Diagnosis Date   Anxiety    Arthritis    Cancer ([place])    basal cell -removed - back, shoulder and chest, squamous cell on nose - removed   COVID 12/2021   no symptoms per pt.   Depression    GERD (gastroesophageal reflux disease)    History of kidney stones    Hx of transfusion of packed red blood cells 06/23/1972   Hypertension    Pneumonia    Shortness of breath    Sleep apnea    cpap    Patient Active Problem List   Diagnosis Date Noted   Lung nodule 12/20/2021   Right upper lobe pulmonary nodule 09/18/2021   HEMORRHOIDS, INTERNAL 07/15/2007   RHINITIS 07/15/2007   GASTRITIS 07/15/2007   DUODENITIS WITHOUT MENTION OF HEMORRHAGE 07/15/2007   DIVERTICULOSIS OF COLON 07/15/2007   ARTHRITIS 07/15/2007   COLONIC POLYPS, HYPERPLASTIC, HX OF 07/15/2007    Past Surgical History:  Procedure Laterality Date   ANTERIOR CERVICAL DECOMP/DISCECTOMY FUSION N/A 02/03/2013   Procedure: ACDF C6 - 7     1 LEVEL;  Surgeon: Melina Schools, MD;  Location: [place];  Service:  Orthopedics;  Laterality: N/A;   APPENDECTOMY     BASAL CELL CARCINOMA EXCISION     BRONCHIAL BIOPSY  01/07/2022   Procedure: BRONCHIAL BIOPSIES;  Surgeon: Garner Nash, DO;  Location: [place];  Service: Pulmonary;;   BRONCHIAL NEEDLE ASPIRATION BIOPSY  01/07/2022   Procedure: BRONCHIAL NEEDLE ASPIRATION BIOPSIES;  Surgeon: Garner Nash, DO;  Location: [place];  Service: Pulmonary;;   CERVICAL SPINE SURGERY  02/03/2013   Dr Rolena Infante   FRACTURE SURGERY Left    tib/fib/femur   TESTICLE REMOVAL Left    TONSILLECTOMY         Home Medications    Prior to Admission medications   Medication Sig Start Date End Date Taking? Authorizing Provider  ofloxacin (OCUFLOX) 0.3 % ophthalmic solution Place 1 drop into the left eye 4 (four) times daily. 02/14/22  Yes Leisel Pinette, Derry Skill, PA-C  acetaminophen (TYLENOL) 500 MG tablet Take 1,000 mg by mouth every 6 (six) hours as needed for moderate pain.    [provider]  albuterol (PROVENTIL HFA;VENTOLIN HFA) 108 (90 BASE) MCG/ACT inhaler Inhale 2 puffs into the lungs every 4 (four) hours as needed for wheezing or shortness of breath.    [provider]  Budeson-Glycopyrrol-Formoterol (BREZTRI AEROSPHERE) 160-9-4.8 MCG/ACT AERO Inhale 2 puffs into the lungs in the morning and at bedtime. 01/17/22   Eric Form  F, NP  camphor-menthol (SARNA) lotion Apply 1 Application topically as needed for itching.    [provider]  carbidopa-levodopa (SINEMET IR) 25-100 MG tablet Take 2.5 tablets by mouth 3 (three) times daily. 10/01/20   [provider]  fluticasone (FLONASE) 50 MCG/ACT nasal spray Place 2 sprays into the nose daily as needed for rhinitis or allergies.    [provider]  gabapentin (NEURONTIN) 100 MG capsule Take 200 mg by mouth 3 (three) times daily.    [provider]  ibuprofen (ADVIL) 800 MG tablet Take 800 mg by mouth every 8 (eight) hours as needed for moderate pain. 08/23/21    [provider]  Lactobacillus (ACIDOPHILUS/BIFIDUS PO) Take 2 tablets by mouth daily.    [provider]  loratadine (ALLERGY) 10 MG tablet Take 10 mg by mouth daily. [employer] brand    [provider]  losartan (COZAAR) 100 MG tablet Take 50 mg by mouth daily. 10/31/20   [provider]  methocarbamol (ROBAXIN) 500 MG tablet Take 500 mg by mouth every 8 (eight) hours as needed for muscle spasms. 08/16/21   [provider]  Multiple Vitamin (MULTIVITAMIN WITH MINERALS) TABS tablet Take 1 tablet by mouth daily.    [provider]  Omega-3 Fatty Acids (OMEGA 3 PO) Take 600 mg by mouth daily.    [provider]  pantoprazole (PROTONIX) 40 MG tablet Take 40 mg by mouth daily. 08/14/20   [provider]  propranolol (INDERAL) 20 MG tablet Take 20 mg by mouth 3 (three) times daily.    [provider]  rasagiline (AZILECT) 1 MG TABS tablet TAKE ONE TABLET BY MOUTH ONCE A DAY TO SLOW PARKINSON'S DISEASE - NOT A DOSE CHANGE 10/29/21   [provider]  tamsulosin (FLOMAX) 0.4 MG CAPS capsule Take 0.4 mg by mouth daily. 10/31/20   [provider]  Tiotropium Bromide-Olodaterol (STIOLTO RESPIMAT) 2.5-2.5 MCG/ACT AERS Inhale 2 puffs into the lungs daily. 10/03/21   Icard, Octavio Graves, DO  Tiotropium Bromide-Olodaterol (STIOLTO RESPIMAT) 2.5-2.5 MCG/ACT AERS Inhale 2 puffs into the lungs daily. 01/17/22   Magdalen Spatz, NP  venlafaxine XR (EFFEXOR-XR) 37.5 MG 24 hr capsule Take 37.5 mg by mouth daily.    [provider]    Family History Family History  Problem Relation Age of Onset   Mental illness Mother     Social History Social History   Tobacco Use   Smoking status: Former    Packs/day: 0.50    Years: 36.00    Total pack years: 18.00    Types: Cigarettes    Start date: 74    Quit date: 06/20/2007    Years since quitting: 14.6   Smokeless tobacco: Never  Vaping Use   Vaping Use: Never used   Substance Use Topics   Alcohol use: Yes    Comment: maybe 2 beers a week   Drug use: Not Currently    Types: Marijuana    Comment: 01/06/22 - none for 6 months     Allergies   Lisinopril   Review of Systems Review of Systems  Constitutional:  Positive for activity change. Negative for appetite change, fatigue and fever.  HENT:  Negative for congestion, sinus pressure, sneezing and sore throat.   Eyes:  Positive for discharge, redness and itching. Negative for photophobia, pain and visual disturbance.  Gastrointestinal:  Negative for abdominal pain, diarrhea, nausea and vomiting.  Neurological:  Negative for dizziness, light-headedness and headaches.  Physical Exam Triage Vital Signs ED Triage Vitals [02/14/22 0844]  Enc Vitals Group     BP (!) 148/90     Pulse Rate 70     Resp 18     Temp 98 F (36.7 C)     Temp Source Oral     SpO2 96 %     Weight      Height      Head Circumference      Peak Flow      Pain Score 2     Pain Loc      Pain Edu?      Excl. in [place]?    No data found.  Updated Vital Signs BP (!) 148/90 (BP Location: Left Arm)   Pulse 70   Temp 98 F (36.7 C) (Oral)   Resp 18   SpO2 96%   Visual Acuity Right Eye Distance: 20/25 Left Eye Distance: 20/40 Bilateral Distance: 20/20  Right Eye Near:   Left Eye Near:    Bilateral Near:     Physical Exam Vitals reviewed.  Constitutional:      General: He is awake.     Appearance: Normal appearance. He is well-developed. He is not ill-appearing.     Comments: Very pleasant male appears stated age in no acute distress sitting comfortably in exam room  HENT:     Head: Normocephalic and atraumatic.     Mouth/Throat:     Pharynx: No oropharyngeal exudate, posterior oropharyngeal erythema or uvula swelling.  Eyes:     General: Lids are normal. Lids are everted, no foreign bodies appreciated.     Extraocular Movements: Extraocular movements intact.     Conjunctiva/sclera:     Right eye:  Right conjunctiva is not injected. No chemosis or exudate.    Left eye: Left conjunctiva is injected. No chemosis or exudate.    Pupils: Pupils are equal, round, and reactive to light.     Left eye: No corneal abrasion or fluorescein uptake. Seidel exam negative.    Comments: Resolution of discomfort/pain following application of tetracaine.  Cardiovascular:     Rate and Rhythm: Normal rate and regular rhythm.     Heart sounds: Normal heart sounds, S1 normal and S2 normal. No murmur heard. Pulmonary:     Effort: Pulmonary effort is normal.     Breath sounds: Normal breath sounds. No stridor. No wheezing, rhonchi or rales.     Comments: Clear to auscultation bilaterally Neurological:     Mental Status: He is alert.  Psychiatric:        Behavior: Behavior is cooperative.      UC Treatments / Results  Labs (all labs ordered are listed, but only abnormal results are displayed) Labs Reviewed - No data to display  EKG   Radiology No results found.  Procedures Procedures (including critical care time)  Medications Ordered in UC Medications - No data to display  Initial Impression / Assessment and Plan / UC Course  I have reviewed the triage vital signs and the nursing notes.  Pertinent labs & imaging results that were available during my care of the patient were reviewed by me and considered in my medical decision making (see chart for details).     No obvious corneal abrasion on exam.  Patient did have significant improvement of pain/discomfort with tetracaine.  We will treat for bacterial conjunctivitis and patient was started on ofloxacin 1 drop 4 times daily for 1 week.  Recommended that he use safety  glasses whenever he is working outside.  If his symptoms or not improving quickly he is to follow-up with ophthalmology was given contact information for local provider with instruction to call to schedule an appointment.  He can use lubricating eyedrops/artificial tears for  additional symptom relief.  If he has any worsening symptoms including ocular pain, visual disturbance, photophobia, fever, nausea, vomiting he needs to go to the emergency room to which he expressed understanding.  Final Clinical Impressions(s) / UC Diagnoses   Final diagnoses:  Acute bacterial conjunctivitis of left eye  Irritation of left eye     Discharge Instructions      He should follow-up with an eye doctor.  Please call to schedule an appointment.  Use ofloxacin 1 drop in the eye 4 times daily.  You can use lubricating eyedrops/artificial tears for additional symptom relief.  Make sure you wear protective glasses whenever you are working in the yard.  If you have any worsening symptoms including vision change, eye pain, sensitivity to light, fever, nausea, vomiting, headache, dizziness you need to go to the emergency room immediately.     ED Prescriptions     Medication Sig Dispense Auth. Provider   ofloxacin (OCUFLOX) 0.3 % ophthalmic solution Place 1 drop into the left eye 4 (four) times daily. 5 mL Jerine Surles K, PA-C      PDMP not reviewed this encounter.   Terrilee Croak, PA-C 02/14/22 [phone]

## 2022-02-14 NOTE — Discharge Instructions (Signed)
He should follow-up with an eye doctor.  Please call to schedule an appointment.  Use ofloxacin 1 drop in the eye 4 times daily.  You can use lubricating eyedrops/artificial tears for additional symptom relief.  Make sure you wear protective glasses whenever you are working in the yard.  If you have any worsening symptoms including vision change, eye pain, sensitivity to light, fever, nausea, vomiting, headache, dizziness you need to go to the emergency room immediately.

## 2022-02-19 ENCOUNTER — Other Ambulatory Visit: Payer: Self-pay

## 2022-02-19 ENCOUNTER — Inpatient Hospital Stay: Payer: No Typology Code available for payment source

## 2022-02-19 DIAGNOSIS — C884 Extranodal marginal zone B-cell lymphoma of mucosa-associated lymphoid tissue [MALT-lymphoma]: Secondary | ICD-10-CM | POA: Diagnosis not present

## 2022-02-19 DIAGNOSIS — C8580 Other specified types of non-Hodgkin lymphoma, unspecified site: Secondary | ICD-10-CM

## 2022-02-19 LAB — CBC WITH DIFFERENTIAL (CANCER CENTER ONLY)
Abs Immature Granulocytes: 0.05 10*3/uL (ref 0.00–0.07)
Basophils Absolute: 0.1 10*3/uL (ref 0.0–0.1)
Basophils Relative: 1 %
Eosinophils Absolute: 0.3 10*3/uL (ref 0.0–0.5)
Eosinophils Relative: 4 %
HCT: 44.7 % (ref 39.0–52.0)
Hemoglobin: 15.8 g/dL (ref 13.0–17.0)
Immature Granulocytes: 1 %
Lymphocytes Relative: 20 %
Lymphs Abs: 1.6 10*3/uL (ref 0.7–4.0)
MCH: 31 pg (ref 26.0–34.0)
MCHC: 35.3 g/dL (ref 30.0–36.0)
MCV: 87.6 fL (ref 80.0–100.0)
Monocytes Absolute: 0.6 10*3/uL (ref 0.1–1.0)
Monocytes Relative: 8 %
Neutro Abs: 5.6 10*3/uL (ref 1.7–7.7)
Neutrophils Relative %: 66 %
Platelet Count: 146 10*3/uL — ABNORMAL LOW (ref 150–400)
RBC: 5.1 MIL/uL (ref 4.22–5.81)
RDW: 12.6 % (ref 11.5–15.5)
WBC Count: 8.3 10*3/uL (ref 4.0–10.5)
nRBC: 0 % (ref 0.0–0.2)

## 2022-02-19 LAB — CMP (CANCER CENTER ONLY)
ALT: 5 U/L (ref 0–44)
AST: 13 U/L — ABNORMAL LOW (ref 15–41)
Albumin: 4.1 g/dL (ref 3.5–5.0)
Alkaline Phosphatase: 77 U/L (ref 38–126)
Anion gap: 5 (ref 5–15)
BUN: 13 mg/dL (ref 8–23)
CO2: 29 mmol/L (ref 22–32)
Calcium: 9.6 mg/dL (ref 8.9–10.3)
Chloride: 104 mmol/L (ref 98–111)
Creatinine: 0.95 mg/dL (ref 0.61–1.24)
GFR, Estimated: >60 mL/min (ref >60)
Glucose, Bld: 87 mg/dL (ref 70–99)
Potassium: 4.3 mmol/L (ref 3.5–5.1)
Sodium: 138 mmol/L (ref 135–145)
Total Bilirubin: 0.4 mg/dL (ref 0.3–1.2)
Total Protein: 6.9 g/dL (ref 6.5–8.1)

## 2022-02-19 LAB — LACTATE DEHYDROGENASE: LDH: 107 U/L (ref 98–192)

## 2022-02-19 LAB — HEPATITIS B SURFACE ANTIGEN: Hepatitis B Surface Ag: NONREACTIVE

## 2022-02-19 LAB — HEPATITIS B CORE ANTIBODY, TOTAL: Hep B Core Total Ab: NONREACTIVE

## 2022-02-19 LAB — HEPATITIS C ANTIBODY: HCV Ab: NONREACTIVE

## 2022-02-19 LAB — HIV ANTIBODY (ROUTINE TESTING W REFLEX): HIV Screen 4th Generation wRfx: NONREACTIVE

## 2022-02-27 ENCOUNTER — Ambulatory Visit (HOSPITAL_COMMUNITY)
Admission: RE | Admit: 2022-02-27 | Discharge: 2022-02-27 | Disposition: A | Discharge status: Home / Self Care | Payer: No Typology Code available for payment source | Source: Ambulatory Visit | Attending: Hematology | Admitting: Hematology

## 2022-02-27 DIAGNOSIS — C8580 Other specified types of non-Hodgkin lymphoma, unspecified site: Secondary | ICD-10-CM | POA: Diagnosis not present

## 2022-02-27 LAB — GLUCOSE, CAPILLARY: Glucose-Capillary: 89 mg/dL (ref 70–99)

## 2022-02-27 MED ORDER — FLUDEOXYGLUCOSE F - 18 (FDG) INJECTION
13.8000 | Freq: Once | INTRAVENOUS | Status: AC
  Administered 2022-02-27: 13.6 via INTRAVENOUS

## 2022-03-05 ENCOUNTER — Inpatient Hospital Stay: Payer: No Typology Code available for payment source | Attending: Hematology | Admitting: Hematology

## 2022-03-05 DIAGNOSIS — C884 Extranodal marginal zone B-cell lymphoma of mucosa-associated lymphoid tissue [MALT-lymphoma]: Secondary | ICD-10-CM | POA: Insufficient documentation

## 2022-03-05 DIAGNOSIS — C8580 Other specified types of non-Hodgkin lymphoma, unspecified site: Secondary | ICD-10-CM

## 2022-03-05 DIAGNOSIS — Z87891 Personal history of nicotine dependence: Secondary | ICD-10-CM | POA: Insufficient documentation

## 2022-03-09 NOTE — Progress Notes (Signed)
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  HISTORY OF PRESENTING ILLNESS:  Chad Knapp is a wonderful 71 y.o. male who has been referred to [place] by Dr Harlow Mares, [facility] for evaluation and consultation of extranodal pulmonary marginal zone lymphoma. He reports He is doing well with no new symptoms or concerns.  He notes history of SOB for about 5-6 years.   History of smoking for 37 years previously. He smoked a pack over 3-4 days. He notes he quit smoking 06/20/2007.  We discussed getting a full body PET scan for further evaluation which he is agreeable to.  He uses a CPAP that he has had for 10 years.   He notes that he has been attempting to lose weight.   We discussed getting baseline labs today for further evaluation which he is agreeable to.  No history of cardiovascular issues.   Previous COVID-19 infection 12/2021 which he notes he experienced no obvious signs or symptoms from.  We discussed that we might take an active surveillance approach but that is to be determined based on labs and PET/CT.  No fever, chills, night sweats. No new lumps, bumps, or lesions/rashes. No abdominal pain or change in bowel habits. No new or unexpected weight loss. No SOB or chest pain. No other new or acute focal symptoms.  We discussed his recent CT chest wo contrast done 11/20/2021.  INTERVAL HISTORY: I connected with Chad Knapp on 03/09/2022 at 8:40 AM EST by telephone visit and verified that I am speaking with the correct person using two identifiers.   I discussed the limitations, risks, security and privacy concerns of performing an evaluation and management service by telemedicine and the availability of in-person appointments. I also discussed with the  patient that there may be a patient responsible charge related to this service. The patient expressed understanding and agreed to proceed.   Other persons participating in the visit and their role in the encounter: None  Patient's location: Home Provider's location: [place]  Chad Knapp is a 71 y.o. male was contacted via phone for continued evaluation and management of extranodal pulmonary marginal zone lymphoma. He reports He is doing well with no new symptoms or concerns.  Based on his recent PET/CT scan we discussed that we will likely take an active surveillance approach which he is agreeable to.  No fever, chills, night sweats. No new lumps, bumps, or lesions/rashes. No abdominal pain or change in bowel habits. No new or unexpected weight loss. No SOB or chest pain. No other new or acute focal symptoms.  PET/CT done 02/27/2022 was reviewed in detail.  MEDICAL HISTORY:  Past Medical History:  Diagnosis Date   Anxiety    Arthritis    Cancer ([place])    basal cell -removed - back, shoulder and chest, squamous cell on nose - removed   COVID 12/2021   no symptoms per pt.   Depression    GERD (gastroesophageal reflux disease)    History of kidney stones    Hx of transfusion of packed red blood cells 06/23/1972   Hypertension    Pneumonia    Shortness of breath    Sleep apnea    cpap    SURGICAL HISTORY: Past Surgical History:  Procedure Laterality Date   ANTERIOR CERVICAL DECOMP/DISCECTOMY FUSION  N/A 02/03/2013   Procedure: ACDF C6 - 7     1 LEVEL;  Surgeon: Melina Schools, MD;  Location: [place];  Service: Orthopedics;  Laterality: N/A;   APPENDECTOMY     BASAL CELL CARCINOMA EXCISION     BRONCHIAL BIOPSY  01/07/2022   Procedure: BRONCHIAL BIOPSIES;  Surgeon: Garner Nash, DO;  Location: [facility];  Service: Pulmonary;;   BRONCHIAL NEEDLE ASPIRATION BIOPSY  01/07/2022   Procedure: BRONCHIAL NEEDLE ASPIRATION BIOPSIES;  Surgeon: Garner Nash,  DO;  Location: [facility];  Service: Pulmonary;;   CERVICAL SPINE SURGERY  02/03/2013   Dr Rolena Infante   FRACTURE SURGERY Left    tib/fib/femur   TESTICLE REMOVAL Left    TONSILLECTOMY      SOCIAL HISTORY: Social History   Socioeconomic History   Marital status: Married    Spouse name: Not on file   Number of children: Not on file   Years of education: Not on file   Highest education level: Not on file  Occupational History   Not on file  Tobacco Use   Smoking status: Former    Packs/day: 0.50    Years: 36.00    Total pack years: 18.00    Types: Cigarettes    Start date: 46    Quit date: 06/20/2007    Years since quitting: 14.7   Smokeless tobacco: Never  Vaping Use   Vaping Use: Never used  Substance and Sexual Activity   Alcohol use: Yes    Comment: maybe 2 beers a week   Drug use: Not Currently    Types: Marijuana    Comment: 01/06/22 - none for 6 months   Sexual activity: Yes  Other Topics Concern   Not on file  Social History Narrative   Not on file   Social Determinants of Health   Financial Resource Strain: Not on file  Food Insecurity: Not on file  Transportation Needs: Not on file  Physical Activity: Not on file  Stress: Not on file  Social Connections: Not on file  Intimate Partner Violence: Not on file    FAMILY HISTORY: Family History  Problem Relation Age of Onset   Mental illness Mother     ALLERGIES:  is allergic to lisinopril.  MEDICATIONS:  Current Outpatient Medications  Medication Sig Dispense Refill   acetaminophen (TYLENOL) 500 MG tablet Take 1,000 mg by mouth every 6 (six) hours as needed for moderate pain.     albuterol (PROVENTIL HFA;VENTOLIN HFA) 108 (90 BASE) MCG/ACT inhaler Inhale 2 puffs into the lungs every 4 (four) hours as needed for wheezing or shortness of breath.     Budeson-Glycopyrrol-Formoterol (BREZTRI AEROSPHERE) 160-9-4.8 MCG/ACT AERO Inhale 2 puffs into the lungs in the morning and at bedtime. 10.7 g 0    camphor-menthol (SARNA) lotion Apply 1 Application topically as needed for itching.     carbidopa-levodopa (SINEMET IR) 25-100 MG tablet Take 2.5 tablets by mouth 3 (three) times daily.     fluticasone (FLONASE) 50 MCG/ACT nasal spray Place 2 sprays into the nose daily as needed for rhinitis or allergies.     gabapentin (NEURONTIN) 100 MG capsule Take 200 mg by mouth 3 (three) times daily.     ibuprofen (ADVIL) 800 MG tablet Take 800 mg by mouth every 8 (eight) hours as needed for moderate pain.     Lactobacillus (ACIDOPHILUS/BIFIDUS PO) Take 2 tablets by mouth daily.     loratadine (ALLERGY) 10 MG tablet Take 10 mg by mouth daily.  [employer] brand     losartan (COZAAR) 100 MG tablet Take 50 mg by mouth daily.     methocarbamol (ROBAXIN) 500 MG tablet Take 500 mg by mouth every 8 (eight) hours as needed for muscle spasms.     Multiple Vitamin (MULTIVITAMIN WITH MINERALS) TABS tablet Take 1 tablet by mouth daily.     ofloxacin (OCUFLOX) 0.3 % ophthalmic solution Place 1 drop into the left eye 4 (four) times daily. 5 mL 0   Omega-3 Fatty Acids (OMEGA 3 PO) Take 600 mg by mouth daily.     pantoprazole (PROTONIX) 40 MG tablet Take 40 mg by mouth daily.     propranolol (INDERAL) 20 MG tablet Take 20 mg by mouth 3 (three) times daily.     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     Tiotropium Bromide-Olodaterol (STIOLTO RESPIMAT) 2.5-2.5 MCG/ACT AERS Inhale 2 puffs into the lungs daily. 4 g 2   Tiotropium Bromide-Olodaterol (STIOLTO RESPIMAT) 2.5-2.5 MCG/ACT AERS Inhale 2 puffs into the lungs daily. 1 each 6   venlafaxine XR (EFFEXOR-XR) 37.5 MG 24 hr capsule Take 37.5 mg by mouth daily.     No current facility-administered medications for this visit.    REVIEW OF SYSTEMS:    10 Point review of Systems was done is negative except as noted  above.  PHYSICAL EXAMINATION: Telemedicine visit  LABORATORY DATA:  I have reviewed the data as listed  .    Latest Ref Rng & Units 02/19/2022   12:11 PM 01/07/2022    6:35 AM 08/30/2021    3:48 PM  CBC  WBC 4.0 - 10.5 K/uL 8.3  7.1    Hemoglobin 13.0 - 17.0 g/dL 15.8  15.3  15.6   Hematocrit 39.0 - 52.0 % 44.7  44.1  46.0   Platelets 150 - 400 K/uL 146  140     .    Latest Ref Rng & Units 02/19/2022   12:11 PM 01/07/2022    6:35 AM 08/30/2021    3:48 PM  CMP  Glucose 70 - 99 mg/dL 87  96    BUN 8 - 23 mg/dL 13  15    Creatinine 0.61 - 1.24 mg/dL 0.95  0.95    Sodium 135 - 145 mmol/L 138  135  137   Potassium 3.5 - 5.1 mmol/L 4.3  3.8  4.4   Chloride 98 - 111 mmol/L 104  105    CO2 22 - 32 mmol/L 29  20    Calcium 8.9 - 10.3 mg/dL 9.6  8.8    Total Protein 6.5 - 8.1 g/dL 6.9     Total Bilirubin 0.3 - 1.2 mg/dL 0.4     Alkaline Phos 38 - 126 U/L 77     AST 15 - 41 U/L 13     ALT 0 - 44 U/L 5       RADIOGRAPHIC STUDIES: I have personally reviewed the radiological images as listed and agreed with the findings in the report. NM PET Image Initial (PI) Skull Base To Thigh  Result Date: 02/28/2022 CLINICAL DATA:  Initial treatment strategy for marginal zone lymphoma. EXAM: NUCLEAR MEDICINE PET SKULL BASE TO THIGH TECHNIQUE: 13.6 mCi F-18 FDG was injected intravenously. Full-ring PET imaging was performed from the skull base to thigh after the  radiotracer. CT data was obtained and used for attenuation correction and anatomic localization. Fasting blood glucose: 89 mg/dl COMPARISON:  CT chest 11/20/2021 and CT AP 11/07/2020 FINDINGS: Mediastinal blood pool activity: SUV max 3.65 Liver activity: SUV max 4.50 NECK: No enlarged hypermetabolic lymph nodes in the neck. Two nonspecific foci of asymmetric increased radiotracer are noted including: -increased uptake within the left tonsillar region has an SUV max of 6.03, image 35/4. This is compared with 4.79 on the contralateral side.  -asymmetric increased uptake within the right base of tongue has an SUV max of 5.42, image 40/4. This is compared with 3.53 on the contralateral side. Incidental CT findings: None. CHEST: No tracer avid axillary, supraclavicular, mediastinal, or hilar lymph nodes. -Right upper lobe part solid nodule is again noted measuring approximately 1.3 cm with SUV max of 3.40, image 28/7. -solid nodule within the superior segment of right lower lobe measures 7 mm (technically too small to reliably characterize by PET-CT) within SUV max of 1.32, image 40/7. Incidental CT findings: Similar appearance of bibasilar pulmonary scarring with volume loss. Asymmetric elevation of the left hemidiaphragm is unchanged. Normal heart size. Aortic atherosclerosis and coronary artery calcifications. ABDOMEN/PELVIS: No abnormal tracer uptake identified within the liver, pancreas, spleen, or adrenal glands. Spleen is normal in size measuring 11.9 cm in cranial caudal dimension. No tracer avid abdominopelvic adenopathy. Incidental CT findings: Aortic atherosclerosis. Small nonobstructing stone noted in the inferior pole of the right kidney. Mild prostate gland enlargement. SKELETON: No focal hypermetabolic activity (above background bone marrow activity) none to suggest skeletal metastasis. Mildly increased radiotracer uptake is identified throughout the axial and appendicular skeleton. For reference purposes SUV max within the right iliac bone is equal to 2.89. SUV max within the T12 vertebral body has an SUV max of 3.63. Incidental CT findings: None. IMPRESSION: 1. The part solid nodule within the posterior right upper lobe with biopsy-proven non-Hodgkin's lymphoma is FDG avid with SUV max of 3.4. 2. Solid nodule in the right lower lobe measuring 7 mm is technically too small to reliably characterize by PET-CT within SUV max of 1.32. 3. Two nonspecific foci of increased radiotracer uptake are noted within the right base of tongue and left  tonsillar region. Advise attention in these areas on future surveillance imaging. 4. No tracer avid adenopathy within the neck, chest, abdomen or pelvis. Electronically Signed   By: Kerby Moors M.D.   On: 02/28/2022 14:30    CT chest wo contrast ([id number]) done 11/20/2021 revealed "1. Pulmonary nodules bilaterally, similar to the prior study, most concerning of which is a mixed solid and sub solid lesion in the posterior right upper lobe measuring 1.4 x 1.2 cm with 9 mm solid component. 2. Aortic atherosclerosis, in addition to left main and 2 vessel coronary artery disease. Assessment for potential risk factor modification, dietary therapy or pharmacologic therapy may be warranted, if clinically indicated. 3. Hepatic steatosis. 4. Additional incidental findings, as above. Aortic Atherosclerosis (ICD10-I70.0)."  ASSESSMENT & PLAN:   71 y.o. very pleasant male with   1. Extranodal pulmonary marginal zone lymphoma. Plan -I discussed available labs with the patient in details Labs done 02/19/2022 were reviewed in detail. CBC and CMP stable and unremarkable. LDH is 107. Hep C antibody is non reactive. HIV antibody is non reactive. Hep B surface and core are non reactive. -Patient's chart reviewed in detail -We discussed the diagnosis of extranodal pulmonary marginal zone lymphoma, the natural history, staging, initial evaluation, common symptoms, treatment options  -PET/CT  done 02/27/2022 showed the part solid nodule in posterior right upper lobe is FDG avid with SUV max of 3.4. 2 nonspecific foci noted within right base of tongue and left tonsillar region. No adenopathy as noted above. -patient was recommended and shall f/u with his ENT doctor for detailed HEENT examination. He was offered option for rad onc referral for consideration of ISRT but prefers to take a conservative approach.. Will f/u with CT chest in about 6 months.  Follow up: CT chest wo contrast in 22 weeks RTC with Dr  Irene Limbo with labs in 24 weeks F/u with ENT at [facility]  .The total time spent in the appointment was 20 minutes* .  All of the patient's questions were answered with apparent satisfaction. The patient knows to call the clinic with any problems, questions or concerns.   Sullivan Lone MD MS AAHIVMS [facility] Hematology/Oncology Physician [facility]  .*Total Encounter Time as defined by the Centers for Medicare and Medicaid Services includes, in addition to the face-to-face time of a patient visit (documented in the note above) non-face-to-face time: obtaining and reviewing outside history, ordering and reviewing medications, tests or procedures, care coordination (communications with other health care professionals or caregivers) and documentation in the medical record.  I, Melene Muller, am acting as scribe for Dr. Sullivan Lone, MD.  .I have reviewed the above documentation for accuracy and completeness, and I agree with the above. Brunetta Genera MD

## 2022-03-20 ENCOUNTER — Telehealth: Payer: Self-pay | Admitting: Hematology

## 2022-03-20 NOTE — Telephone Encounter (Signed)
Scheduled follow-up appointment per 9/13 los. Patient is aware. 

## 2022-03-21 ENCOUNTER — Encounter: Payer: Self-pay | Admitting: Acute Care

## 2022-03-21 ENCOUNTER — Ambulatory Visit (INDEPENDENT_AMBULATORY_CARE_PROVIDER_SITE_OTHER): Payer: Federal, State, Local not specified - PPO | Admitting: Acute Care

## 2022-03-21 VITALS — BP 130/78 | HR 84 | Ht 75.0 in | Wt 281.0 lb

## 2022-03-21 DIAGNOSIS — G4733 Obstructive sleep apnea (adult) (pediatric): Secondary | ICD-10-CM | POA: Diagnosis not present

## 2022-03-21 DIAGNOSIS — J449 Chronic obstructive pulmonary disease, unspecified: Secondary | ICD-10-CM

## 2022-03-21 DIAGNOSIS — C8599 Non-Hodgkin lymphoma, unspecified, extranodal and solid organ sites: Secondary | ICD-10-CM | POA: Diagnosis not present

## 2022-03-21 DIAGNOSIS — J439 Emphysema, unspecified: Secondary | ICD-10-CM

## 2022-03-21 NOTE — Patient Instructions (Addendum)
It is food to see you today. We are glad you doing well. Follow up with ENT and oncology as is scheduled for continued imaging. Continue Stialto daily as you have been doing. Rinse mouth after use.  An incentive Spirometer may help build up your breathing muscles.  See if you can get one through the [place].  If not, they are available at [facility] >> [address]  Follow up as needed.  Continue on CPAP at bedtime. You appear to be benefiting from the treatment  Goal is to wear for at least 6 hours each night for maximal clinical benefit. Continue to work on weight loss, as the link between excess weight  and sleep apnea is well established.   Remember to establish a good bedtime routine, and work on sleep hygiene.  Limit daytime naps , avoid stimulants such as caffeine and nicotine close to bedtime, exercise daily to promote sleep quality, avoid heavy , spicy, fried , or rich foods before bed. Ensure adequate exposure to natural light during the day,establish a relaxing bedtime routine with a pleasant sleep environment ( Bedroom between 60 and 67 degrees, turn off bright lights , TV or device screens screens , consider black out curtains or white noise machines) Do not drive if sleepy. Remember to clean mask, tubing, filter, and reservoir once weekly with soapy water.  Follow up with the [facility] for pulmonary needs, but call us if you need [place].  Please contact office for sooner follow up if symptoms do not improve or worsen or seek emergency care

## 2022-03-21 NOTE — Progress Notes (Signed)
History of Present Illness Chad Knapp is a 71 y.o. male former smoker ( Quit 2008 with a 30 pack year smoking history) with with history of right upper lobe groundglass lesion, COPD, and OSA on CPAP.  He underwent a Flexible video fiberoptic bronchoscopy with robotic assistance and biopsies on 01/07/2022 per Dr. Valeta Harms.  Synopsis Chad Knapp is a 71 y.o. male former smoker, quit 2008, with past medical history of depression, shortness of breath, sleep apnea on CPAP, new diagnosis of Parkinson disease.  He had a recent CT chest completed at the [facility]. Patient has a dominant approximate 12 mm right upper lobe lung nodule with areas of groundglass around this. He also has complaints of shortness of breath with exertion and sputum production.He has never been formally diagnosed with COPD.   He was referred to Dr. Valeta Harms for follow up of the lung nodule. He underwent Flexible video fiberoptic bronchoscopy with robotic assistance and biopsies on 01/07/2022 per Dr. Valeta Harms. 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      03/21/2022 Pt. Presents for follow up.He has been seen by Thoracic surgery and he has been declines as a surgical candidate. 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 Plan is for active surveillance. . He had a PET scan which showed one warm area at the base of his tongue. He was referred to ENT, and she saw some inflammation and swelling, and she is going to do a CT on 04/01/2022, then a 6 month follow up CT Chest and labs in 24 weeks as surveillance.    Pt. States the [place] did not help his breathing. He has resumed his [place]. COPD is stable. He states he is wearing his CPAP daily. This is being managed by the [place]. We do not have access to his down Loads.   We will release him back to his  Primary care through the [place]. They have been managing his COPD and OSA. I have told him to call us if he needs [place]. He has follow up with medical oncology and thoracic surgery.   Test Results:               Latest Ref Rng & Units 02/19/2022   12:11 PM 01/07/2022    6:35 AM 08/30/2021    3:48 PM  CBC  WBC 4.0 - 10.5 K/uL 8.3  7.1    Hemoglobin 13.0 - 17.0 g/dL 15.8  15.3  15.6   Hematocrit 39.0 - 52.0 % 44.7  44.1  46.0   Platelets 150 - 400 K/uL 146  140         Latest Ref Rng & Units 02/19/2022   12:11 PM 01/07/2022    6:35 AM 08/30/2021    3:48 PM  BMP  Glucose 70 - 99 mg/dL 87  96    BUN 8 - 23 mg/dL 13  15    Creatinine 0.61 - 1.24 mg/dL 0.95  0.95    Sodium 135 - 145 mmol/L 138  135  137   Potassium 3.5 - 5.1 mmol/L 4.3  3.8  4.4   Chloride 98 - 111 mmol/L 104  105    CO2 22 - 32 mmol/L 29  20    Calcium 8.9 - 10.3 mg/dL 9.6  8.8      BNP No results found for: "  BNP"  ProBNP No results found for: "PROBNP"  PFT    Component Value Date/Time   FEV1PRE 2.26 02/05/2022 0849   FEV1POST 1.78 02/05/2022 0849   FVCPRE 4.18 02/05/2022 0849   FVCPOST 4.19 02/05/2022 0849   TLC 7.53 02/05/2022 0849   DLCOUNC 22.80 02/05/2022 0849   PREFEV1FVCRT 54 02/05/2022 0849   PSTFEV1FVCRT 42 02/05/2022 0849    NM PET Image Initial (PI) Skull Base To Thigh  Result Date: 02/28/2022 CLINICAL DATA:  Initial treatment strategy for marginal zone lymphoma. EXAM: NUCLEAR MEDICINE PET SKULL BASE TO THIGH TECHNIQUE: 13.6 mCi F-18 FDG was injected intravenously. Full-ring PET imaging was performed from the skull base to thigh after the radiotracer. CT data was obtained and used for attenuation correction and anatomic localization. Fasting blood glucose: 89 mg/dl COMPARISON:  CT chest 11/20/2021 and CT AP 11/07/2020 FINDINGS: Mediastinal blood pool activity: SUV max 3.65 Liver activity: SUV max 4.50 NECK: No enlarged hypermetabolic lymph nodes in the neck. Two nonspecific foci of asymmetric  increased radiotracer are noted including: -increased uptake within the left tonsillar region has an SUV max of 6.03, image 35/4. This is compared with 4.79 on the contralateral side. -asymmetric increased uptake within the right base of tongue has an SUV max of 5.42, image 40/4. This is compared with 3.53 on the contralateral side. Incidental CT findings: None. CHEST: No tracer avid axillary, supraclavicular, mediastinal, or hilar lymph nodes. -Right upper lobe part solid nodule is again noted measuring approximately 1.3 cm with SUV max of 3.40, image 28/7. -solid nodule within the superior segment of right lower lobe measures 7 mm (technically too small to reliably characterize by PET-CT) within SUV max of 1.32, image 40/7. Incidental CT findings: Similar appearance of bibasilar pulmonary scarring with volume loss. Asymmetric elevation of the left hemidiaphragm is unchanged. Normal heart size. Aortic atherosclerosis and coronary artery calcifications. ABDOMEN/PELVIS: No abnormal tracer uptake identified within the liver, pancreas, spleen, or adrenal glands. Spleen is normal in size measuring 11.9 cm in cranial caudal dimension. No tracer avid abdominopelvic adenopathy. Incidental CT findings: Aortic atherosclerosis. Small nonobstructing stone noted in the inferior pole of the right kidney. Mild prostate gland enlargement. SKELETON: No focal hypermetabolic activity (above background bone marrow activity) none to suggest skeletal metastasis. Mildly increased radiotracer uptake is identified throughout the axial and appendicular skeleton. For reference purposes SUV max within the right iliac bone is equal to 2.89. SUV max within the T12 vertebral body has an SUV max of 3.63. Incidental CT findings: None. IMPRESSION: 1. The part solid nodule within the posterior right upper lobe with biopsy-proven non-Hodgkin's lymphoma is FDG avid with SUV max of 3.4. 2. Solid nodule in the right lower lobe measuring 7 mm is  technically too small to reliably characterize by PET-CT within SUV max of 1.32. 3. Two nonspecific foci of increased radiotracer uptake are noted within the right base of tongue and left tonsillar region. Advise attention in these areas on future surveillance imaging. 4. No tracer avid adenopathy within the neck, chest, abdomen or pelvis. Electronically Signed   By: Kerby Moors M.D.   On: 02/28/2022 14:30     Past medical hx Past Medical History:  Diagnosis Date   Anxiety    Arthritis    Cancer ([place])    basal cell -removed - back, shoulder and chest, squamous cell on nose - removed   COVID 12/2021   no symptoms per pt.   Depression    GERD (gastroesophageal reflux disease)  History of kidney stones    Hx of transfusion of packed red blood cells 06/23/1972   Hypertension    Pneumonia    Shortness of breath    Sleep apnea    cpap     Social History   Tobacco Use   Smoking status: Former    Packs/day: 0.50    Years: 36.00    Total pack years: 18.00    Types: Cigarettes    Start date: 58    Quit date: 06/20/2007    Years since quitting: 14.7   Smokeless tobacco: Never  Vaping Use   Vaping Use: Never used  Substance Use Topics   Alcohol use: Yes    Comment: maybe 2 beers a week   Drug use: Not Currently    Types: Marijuana    Comment: 01/06/22 - none for 6 months    Mr.Ferris reports that he quit smoking about 14 years ago. His smoking use included cigarettes. He started smoking about 51 years ago. He has a 18.00 pack-year smoking history. He has never used smokeless tobacco. He reports current alcohol use. He reports that he does not currently use drugs after having used the following drugs: Marijuana.  Tobacco Cessation: Former smoker ( Quit 2008 with a 30 pack year smoking history)    Past surgical hx, Family hx, Social hx all reviewed.  Current Outpatient Medications on File Prior to Visit  Medication Sig   acetaminophen (TYLENOL) 500 MG tablet Take 1,000  mg by mouth every 6 (six) hours as needed for moderate pain.   albuterol (PROVENTIL HFA;VENTOLIN HFA) 108 (90 BASE) MCG/ACT inhaler Inhale 2 puffs into the lungs every 4 (four) hours as needed for wheezing or shortness of breath.   camphor-menthol (SARNA) lotion Apply 1 Application topically as needed for itching.   carbidopa-levodopa (SINEMET IR) 25-100 MG tablet Take 2.5 tablets by mouth 3 (three) times daily.   famotidine (PEPCID) 40 MG tablet TAKE ONE TABLET BY MOUTH DAILY FOR ACID REFLUX - REPLACES PANTOPRAZOLE   fluticasone (FLONASE) 50 MCG/ACT nasal spray Place 2 sprays into the nose daily as needed for rhinitis or allergies.   gabapentin (NEURONTIN) 100 MG capsule Take 200 mg by mouth 3 (three) times daily.   ibuprofen (ADVIL) 800 MG tablet Take 800 mg by mouth every 8 (eight) hours as needed for moderate pain.   Lactobacillus (ACIDOPHILUS/BIFIDUS PO) Take 2 tablets by mouth daily.   loratadine (ALLERGY) 10 MG tablet Take 10 mg by mouth daily. [employer] brand   losartan (COZAAR) 100 MG tablet Take 50 mg by mouth daily.   methocarbamol (ROBAXIN) 500 MG tablet Take 500 mg by mouth every 8 (eight) hours as needed for muscle spasms.   Multiple Vitamin (MULTIVITAMIN WITH MINERALS) TABS tablet Take 1 tablet by mouth daily.   ofloxacin (OCUFLOX) 0.3 % ophthalmic solution Place 1 drop into the left eye 4 (four) times daily.   Omega-3 Fatty Acids (OMEGA 3 PO) Take 600 mg by mouth daily.   propranolol (INDERAL) 20 MG tablet Take 20 mg by mouth 3 (three) times daily.   rasagiline (AZILECT) 1 MG TABS tablet TAKE ONE TABLET BY MOUTH ONCE A DAY TO SLOW PARKINSON'S DISEASE - NOT A DOSE CHANGE   tamsulosin (FLOMAX) 0.4 MG CAPS capsule Take 0.4 mg by mouth daily.   Tiotropium Bromide-Olodaterol (STIOLTO RESPIMAT) 2.5-2.5 MCG/ACT AERS Inhale 2 puffs into the lungs daily.   Tiotropium Bromide-Olodaterol (STIOLTO RESPIMAT) 2.5-2.5 MCG/ACT AERS Inhale 2 puffs into the lungs daily.  venlafaxine XR  (EFFEXOR-XR) 37.5 MG 24 hr capsule Take 37.5 mg by mouth daily.   Budeson-Glycopyrrol-Formoterol (BREZTRI AEROSPHERE) 160-9-4.8 MCG/ACT AERO Inhale 2 puffs into the lungs in the morning and at bedtime.   pantoprazole (PROTONIX) 40 MG tablet Take 40 mg by mouth daily.   No current facility-administered medications on file prior to visit.     Allergies  Allergen Reactions   Lisinopril Other (See Comments)    Review Of Systems:  Constitutional:   No  weight loss, night sweats,  Fevers, chills, fatigue, or  lassitude.  HEENT:   No headaches,  Difficulty swallowing,  Tooth/dental problems, or  Sore throat,                No sneezing, itching, ear ache, nasal congestion, post nasal drip,   CV:  No chest pain,  Orthopnea, PND, swelling in lower extremities, anasarca, dizziness, palpitations, syncope.   GI  No heartburn, indigestion, abdominal pain, nausea, vomiting, diarrhea, change in bowel habits, loss of appetite, bloody stools.   Resp: + baseline shortness of breath with exertion or at rest.  + baseline  excess mucus, no productive cough,  No non-productive cough,  No coughing up of blood.  No change in color of mucus.  No wheezing.  No chest wall deformity  Skin: no rash or lesions.  GU: no dysuria, change in color of urine, no urgency or frequency.  No flank pain, no hematuria   MS:  No joint pain or swelling.  No decreased range of motion.  No back pain.  Psych:  No change in mood or affect. No depression or anxiety.  No memory loss.   Vital Signs BP 130/78 (BP Location: Left Arm, Cuff Size: Normal)   Pulse 84   Ht '6\' 3"'$  (1.905 m)   Wt 281 lb (127.5 kg)   SpO2 97%   BMI 35.12 kg/m    Physical Exam:  General- No distress,  A&Ox3, pleasant ENT: No sinus tenderness, TM clear, pale nasal mucosa, no oral exudate,no post nasal drip, no LAN Cardiac: S1, S2, regular rate and rhythm, no murmur Chest: No wheeze/ rales/ dullness; no accessory muscle use, no nasal flaring, no  sternal retractions, diminished per bases Abd.: Soft Non-tender, ND, BS +, Body mass index is 35.12 kg/m.  Ext: No clubbing cyanosis, edema Neuro:  normal strength, MAE x 4, A&O x 3, Appropriate Skin: No rashes, warm and dry, no lesions  Psych: normal mood and behavior   Assessment/Plan Extranodal marginal zone lymphoma of lung. Multiple right sided lesions most of which appear benign Wedge resection would be challenging based on location/ would need upper lobectomy PET with Pet Avid area at base of tongue/ biopsy pending Plan Observational Follow-up with serial imaging Follow up with medical oncology Follow up CT Chest Surveillance in 6 months Follow up with ENT to better evaluate PET avid area at base of tongue   COPD Did not feel therapeutic trial with Breztri was beneficial, hs resumed Stialto Plan Stable interval Continue Stialto daily as you have been doing. Rinse mouth after use.  [facility]   OSA Managed at [place] on CPAP at bedtime. You appear to be benefiting from the treatment  Goal is to wear for at least 6 hours each night for maximal clinical benefit. Continue to work on weight loss, as the link between excess weight  and sleep apnea is well established.  Remember to establish a good bedtime routine, and work  on sleep hygiene.  Limit daytime naps , avoid stimulants such as caffeine and nicotine close to bedtime, exercise daily to promote sleep quality, avoid heavy , spicy, fried , or rich foods before bed. Ensure adequate exposure to natural light during the day,establish a relaxing bedtime routine with a pleasant sleep environment ( Bedroom between 60 and 67 degrees, turn off bright lights , TV or device screens screens , consider black out curtains or white noise machines) Do not drive if sleepy. Remember to clean mask, tubing, filter, and reservoir once weekly with soapy water.  Follow up with the [facility] for pulmonary needs, but call us  if you need [place].  Please contact office for sooner follow up if symptoms do not improve or worsen or seek emergency care    I spent 40 minutes dedicated to the care of this patient on the date of this encounter to include pre-visit review of records, face-to-face time with the patient discussing conditions above, post visit ordering of testing, clinical documentation with the electronic health record, making appropriate referrals as documented, and communicating necessary information to the patient's healthcare team.    Magdalen Spatz, NP 03/21/2022  9:21 AM

## 2022-05-15 IMAGING — CR DG LUMBAR SPINE COMPLETE W/ BEND
7 series · 7 of 7 positions shown · non-contrast
Comparison: Prior lumbar spine radiographs 09/08/2014

CLINICAL DATA: 69-year-old male with lumbar radiculopathy

EXAM:
LUMBAR SPINE - COMPLETE WITH BENDING VIEWS

[w lumbar spine lat]
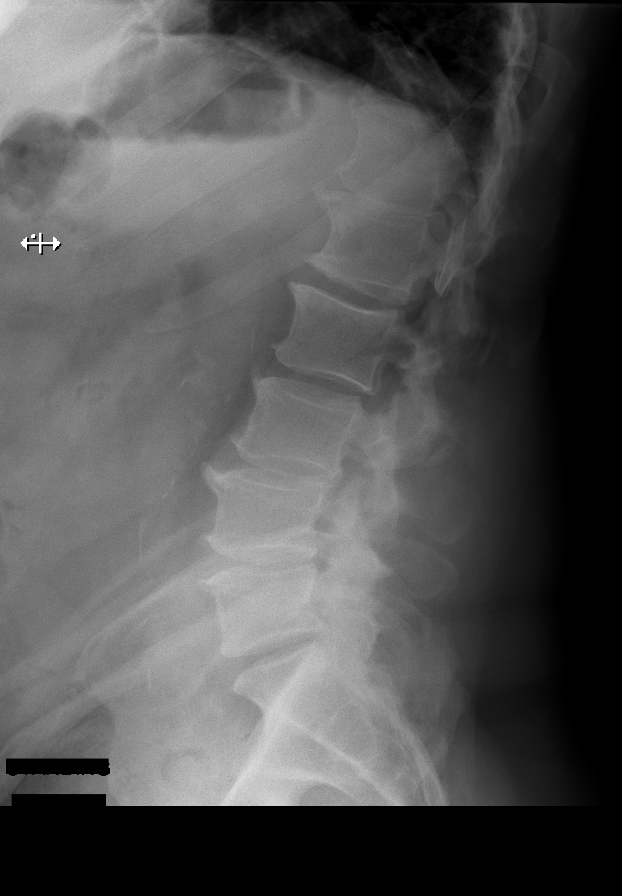

[w lumbar spine flexion]
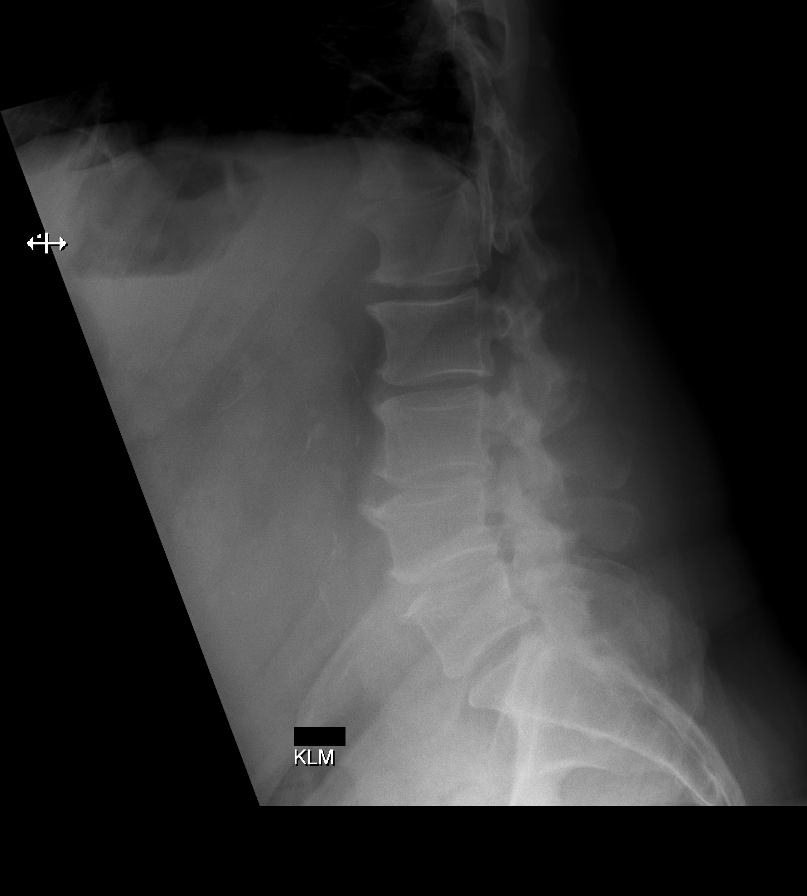

[w lumbar spine extension]
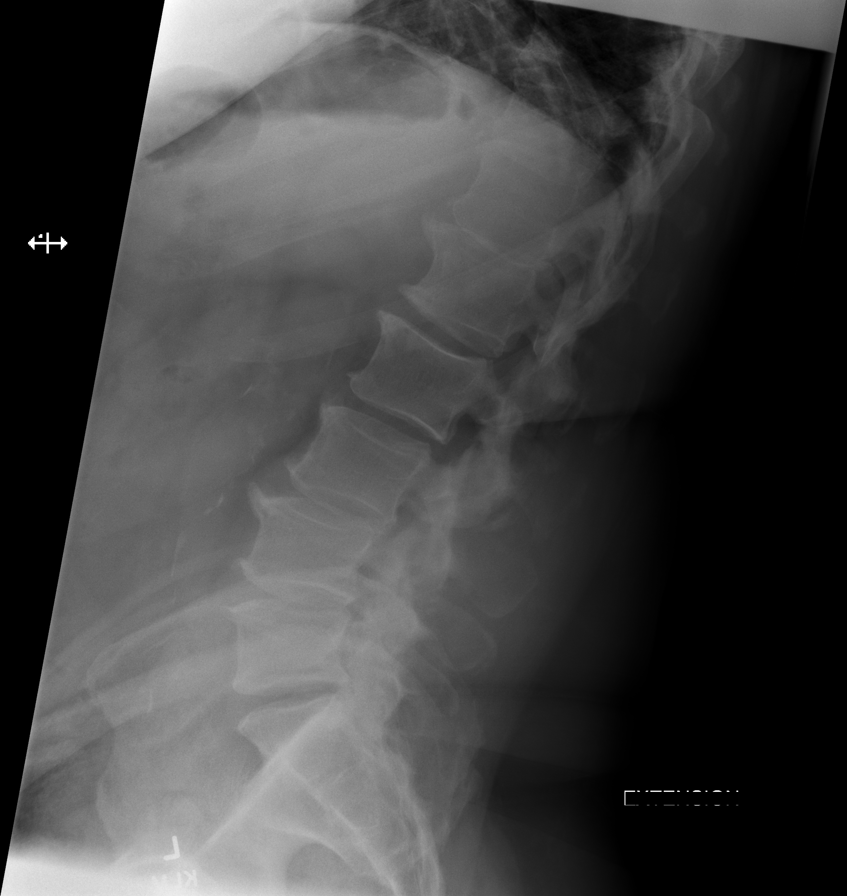

[w lumbar spine ap]
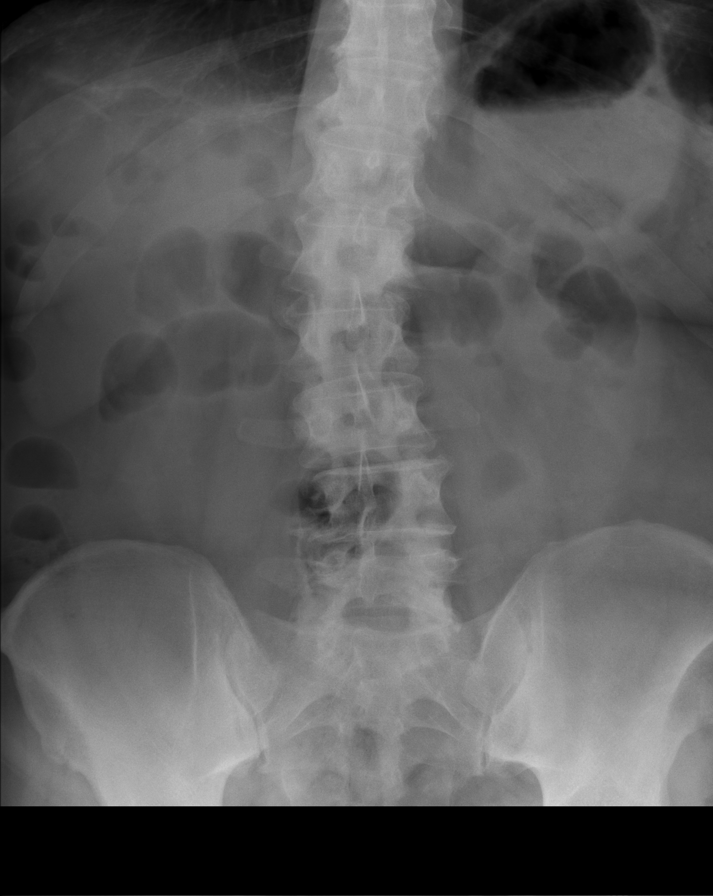

[w lumbar spine obl (1 of 2)]
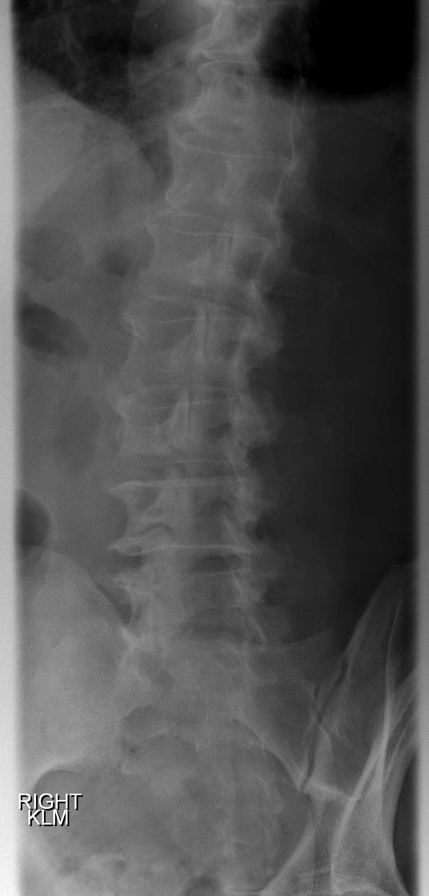

[w lumbar spine obl (2 of 2)]
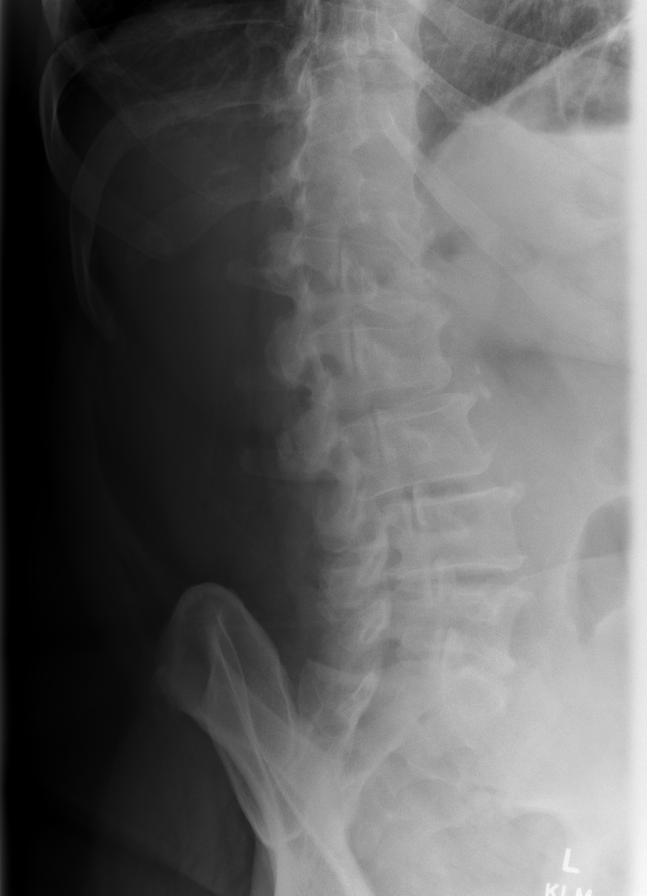

[w lumbar l-5 s-1 spot]
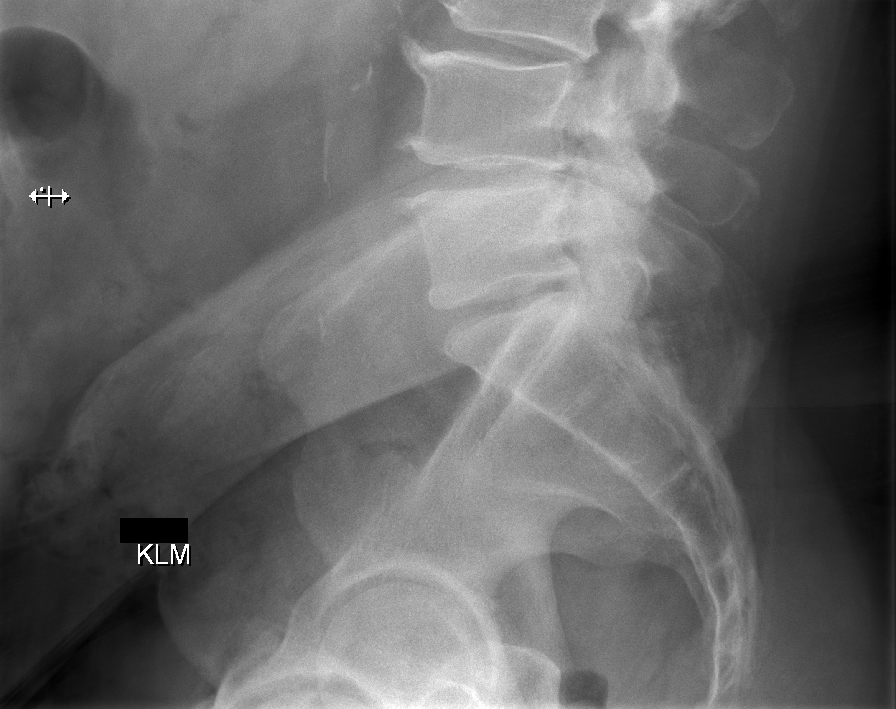

[7 of 7 positions shown; findings below may reference images not displayed]

FINDINGS: Mild dextroconvex scoliosis of the lumbar spine centered at L2.
Multilevel degenerative disc disease. Degenerative changes are most
notable at L3-L4, L4-L5 and L5-S1. Bilateral facet arthropathy also
present at the same levels. No lytic or blastic osseous lesion.
Trace scattered atherosclerotic calcifications in the abdominal
aorta.

Alignment remains grossly normal. No evidence of anterolisthesis. No
dynamic instability on flexion or extension. Unremarkable bowel gas
pattern.
IMPRESSION: 1. No acute fracture or malalignment.
2. Multilevel degenerative disc disease and facet arthropathy at
L3-L4, L4-L5 and L5-S1.
3. Mild levoconvex scoliosis centered at L2.

## 2022-08-18 ENCOUNTER — Other Ambulatory Visit: Payer: Self-pay

## 2022-08-18 DIAGNOSIS — C8580 Other specified types of non-Hodgkin lymphoma, unspecified site: Secondary | ICD-10-CM

## 2022-08-20 ENCOUNTER — Other Ambulatory Visit: Payer: Self-pay

## 2022-08-20 ENCOUNTER — Inpatient Hospital Stay: Payer: Federal, State, Local not specified - PPO

## 2022-08-20 ENCOUNTER — Inpatient Hospital Stay: Payer: Federal, State, Local not specified - PPO | Attending: Hematology | Admitting: Hematology

## 2022-08-20 VITALS — BP 159/96 | HR 77 | Temp 97.9°F | Resp 18 | Wt 283.5 lb

## 2022-08-20 DIAGNOSIS — C8580 Other specified types of non-Hodgkin lymphoma, unspecified site: Secondary | ICD-10-CM

## 2022-08-20 DIAGNOSIS — Z8616 Personal history of COVID-19: Secondary | ICD-10-CM | POA: Diagnosis not present

## 2022-08-20 DIAGNOSIS — Z87891 Personal history of nicotine dependence: Secondary | ICD-10-CM | POA: Insufficient documentation

## 2022-08-20 DIAGNOSIS — C884 Extranodal marginal zone B-cell lymphoma of mucosa-associated lymphoid tissue [MALT-lymphoma]: Secondary | ICD-10-CM | POA: Diagnosis present

## 2022-08-20 LAB — CBC WITH DIFFERENTIAL (CANCER CENTER ONLY)
Abs Immature Granulocytes: 0.05 10*3/uL (ref 0.00–0.07)
Basophils Absolute: 0.1 10*3/uL (ref 0.0–0.1)
Basophils Relative: 1 %
Eosinophils Absolute: 0.4 10*3/uL (ref 0.0–0.5)
Eosinophils Relative: 4 %
HCT: 44.1 % (ref 39.0–52.0)
Hemoglobin: 15.7 g/dL (ref 13.0–17.0)
Immature Granulocytes: 1 %
Lymphocytes Relative: 21 %
Lymphs Abs: 1.8 10*3/uL (ref 0.7–4.0)
MCH: 31.7 pg (ref 26.0–34.0)
MCHC: 35.6 g/dL (ref 30.0–36.0)
MCV: 88.9 fL (ref 80.0–100.0)
Monocytes Absolute: 0.6 10*3/uL (ref 0.1–1.0)
Monocytes Relative: 7 %
Neutro Abs: 5.7 10*3/uL (ref 1.7–7.7)
Neutrophils Relative %: 66 %
Platelet Count: 159 10*3/uL (ref 150–400)
RBC: 4.96 MIL/uL (ref 4.22–5.81)
RDW: 12.6 % (ref 11.5–15.5)
WBC Count: 8.6 10*3/uL (ref 4.0–10.5)
nRBC: 0 % (ref 0.0–0.2)

## 2022-08-20 LAB — CMP (CANCER CENTER ONLY)
ALT: 5 U/L (ref 0–44)
AST: 15 U/L (ref 15–41)
Albumin: 4 g/dL (ref 3.5–5.0)
Alkaline Phosphatase: 69 U/L (ref 38–126)
Anion gap: 4 — ABNORMAL LOW (ref 5–15)
BUN: 15 mg/dL (ref 8–23)
CO2: 28 mmol/L (ref 22–32)
Calcium: 8.9 mg/dL (ref 8.9–10.3)
Chloride: 105 mmol/L (ref 98–111)
Creatinine: 0.88 mg/dL (ref 0.61–1.24)
GFR, Estimated: >60 mL/min (ref >60)
Glucose, Bld: 86 mg/dL (ref 70–99)
Potassium: 4.3 mmol/L (ref 3.5–5.1)
Sodium: 137 mmol/L (ref 135–145)
Total Bilirubin: 0.4 mg/dL (ref 0.3–1.2)
Total Protein: 6.6 g/dL (ref 6.5–8.1)

## 2022-08-20 LAB — LACTATE DEHYDROGENASE: LDH: 122 U/L (ref 98–192)

## 2022-08-21 ENCOUNTER — Telehealth: Payer: Self-pay

## 2022-08-21 NOTE — Telephone Encounter (Signed)
Per 2/28 LOS reached out to patient to schedule, patient aware of date and time of appointment.

## 2022-08-27 NOTE — Progress Notes (Signed)
HEMATOLOGY/ONCOLOGY CLINIC VISIT NOTE  Date of Service: 08/20/2022   Patient Care Team: [facility] as PCP - General  CHIEF COMPLAINTS/PURPOSE OF CONSULTATION:  Evaluation and consultation for extranodal pulmonary marginal zone lymphoma.  HISTORY OF PRESENTING ILLNESS:  Chad Knapp is a wonderful 72 y.o. male who has been referred to [place] by Dr Harlow Mares, [facility] for evaluation and consultation of extranodal pulmonary marginal zone lymphoma. He reports He is doing well with no new symptoms or concerns.  He notes history of SOB for about 5-6 years.   History of smoking for 37 years previously. He smoked a pack over 3-4 days. He notes he quit smoking 06/20/2007.  We discussed getting a full body PET scan for further evaluation which he is agreeable to.  He uses a CPAP that he has had for 10 years.   He notes that he has been attempting to lose weight.   We discussed getting baseline labs today for further evaluation which he is agreeable to.  No history of cardiovascular issues.   Previous COVID-19 infection 12/2021 which he notes he experienced no obvious signs or symptoms from.  We discussed that we might take an active surveillance approach but that is to be determined based on labs and PET/CT.  No fever, chills, night sweats. No new lumps, bumps, or lesions/rashes. No abdominal pain or change in bowel habits. No new or unexpected weight loss. No SOB or chest pain. No other new or acute focal symptoms.  We discussed his recent CT chest wo contrast done 11/20/2021.  INTERVAL HISTORY:  Chad Knapp is here for continued evaluation and management of extranodal pulmonary marginal zone lymphoma.  He notes that he has been doing well and has no acute new symptoms since his last clinic visit. No new lumps or bumps.  No fevers no chills no night sweats. No new fatigue.  No new abdominal pain or distention. No new chest pain or shortness of  breath. No infection issues. Is up-to-date with his vaccinations. He does report being seen by ENT in [place] and no evidence of head and neck cancer or tumor in his oropharynx was noted. Labs done today were discussed in detail with the patient.  MEDICAL HISTORY:  Past Medical History:  Diagnosis Date   Anxiety    Arthritis    Cancer ([place])    basal cell -removed - back, shoulder and chest, squamous cell on nose - removed   COVID 12/2021   no symptoms per pt.   Depression    GERD (gastroesophageal reflux disease)    History of kidney stones    Hx of transfusion of packed red blood cells 06/23/1972   Hypertension    Pneumonia    Shortness of breath    Sleep apnea    cpap    SURGICAL HISTORY: Past Surgical History:  Procedure Laterality Date   ANTERIOR CERVICAL DECOMP/DISCECTOMY FUSION N/A 02/03/2013   Procedure: ACDF C6 - 7     1 LEVEL;  Surgeon: Melina Schools, MD;  Location: [facility];  Service: Orthopedics;  Laterality: N/A;   APPENDECTOMY     BASAL CELL CARCINOMA EXCISION     BRONCHIAL BIOPSY  01/07/2022   Procedure: BRONCHIAL BIOPSIES;  Surgeon: Garner Nash, DO;  Location: [facility];  Service: Pulmonary;;   BRONCHIAL NEEDLE ASPIRATION BIOPSY  01/07/2022   Procedure: BRONCHIAL NEEDLE ASPIRATION BIOPSIES;  Surgeon: Garner Nash, DO;  Location: [facility];  Service: Pulmonary;;   CERVICAL SPINE  SURGERY  02/03/2013   Dr Rolena Infante   FRACTURE SURGERY Left    tib/fib/femur   TESTICLE REMOVAL Left    TONSILLECTOMY      SOCIAL HISTORY: Social History   Socioeconomic History   Marital status: Married    Spouse name: Not on file   Number of children: Not on file   Years of education: Not on file   Highest education level: Not on file  Occupational History   Not on file  Tobacco Use   Smoking status: Former    Packs/day: 0.50    Years: 36.00    Total pack years: 18.00    Types: Cigarettes    Start date: 34    Quit date: 06/20/2007    Years since  quitting: 15.1   Smokeless tobacco: Never  Vaping Use   Vaping Use: Never used  Substance and Sexual Activity   Alcohol use: Yes    Comment: maybe 2 beers a week   Drug use: Not Currently    Types: Marijuana    Comment: 01/06/22 - none for 6 months   Sexual activity: Yes  Other Topics Concern   Not on file  Social History Narrative   Not on file   Social Determinants of Health   Financial Resource Strain: Not on file  Food Insecurity: Not on file  Transportation Needs: Not on file  Physical Activity: Not on file  Stress: Not on file  Social Connections: Not on file  Intimate Partner Violence: Not on file    FAMILY HISTORY: Family History  Problem Relation Age of Onset   Mental illness Mother     ALLERGIES:  is allergic to lisinopril.  MEDICATIONS:  Current Outpatient Medications  Medication Sig Dispense Refill   acetaminophen (TYLENOL) 500 MG tablet Take 1,000 mg by mouth every 6 (six) hours as needed for moderate pain.     albuterol (PROVENTIL HFA;VENTOLIN HFA) 108 (90 BASE) MCG/ACT inhaler Inhale 2 puffs into the lungs every 4 (four) hours as needed for wheezing or shortness of breath.     Budeson-Glycopyrrol-Formoterol (BREZTRI AEROSPHERE) 160-9-4.8 MCG/ACT AERO Inhale 2 puffs into the lungs in the morning and at bedtime. 10.7 g 0   camphor-menthol (SARNA) lotion Apply 1 Application topically as needed for itching.     carbidopa-levodopa (SINEMET IR) 25-100 MG tablet Take 2.5 tablets by mouth 3 (three) times daily.     famotidine (PEPCID) 40 MG tablet TAKE ONE TABLET BY MOUTH DAILY FOR ACID REFLUX - REPLACES PANTOPRAZOLE     fluticasone (FLONASE) 50 MCG/ACT nasal spray Place 2 sprays into the nose daily as needed for rhinitis or allergies.     gabapentin (NEURONTIN) 100 MG capsule Take 200 mg by mouth 3 (three) times daily.     ibuprofen (ADVIL) 800 MG tablet Take 800 mg by mouth every 8 (eight) hours as needed for moderate pain.     Lactobacillus (ACIDOPHILUS/BIFIDUS  PO) Take 2 tablets by mouth daily.     loratadine (ALLERGY) 10 MG tablet Take 10 mg by mouth daily. [employer]     losartan (COZAAR) 100 MG tablet Take 50 mg by mouth daily.     methocarbamol (ROBAXIN) 500 MG tablet Take 500 mg by mouth every 8 (eight) hours as needed for muscle spasms.     Multiple Vitamin (MULTIVITAMIN WITH MINERALS) TABS tablet Take 1 tablet by mouth daily.     ofloxacin (OCUFLOX) 0.3 % ophthalmic solution Place 1 drop into the left eye 4 (four) times  daily. 5 mL 0   Omega-3 Fatty Acids (OMEGA 3 PO) Take 600 mg by mouth daily.     pantoprazole (PROTONIX) 40 MG tablet Take 40 mg by mouth daily.     propranolol (INDERAL) 20 MG tablet Take 20 mg by mouth 3 (three) times daily.     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     Tiotropium Bromide-Olodaterol (STIOLTO RESPIMAT) 2.5-2.5 MCG/ACT AERS Inhale 2 puffs into the lungs daily. 4 g 2   Tiotropium Bromide-Olodaterol (STIOLTO RESPIMAT) 2.5-2.5 MCG/ACT AERS Inhale 2 puffs into the lungs daily. 1 each 6   venlafaxine XR (EFFEXOR-XR) 37.5 MG 24 hr capsule Take 37.5 mg by mouth daily.     No current facility-administered medications for this visit.    REVIEW OF SYSTEMS:   10 Point review of Systems was done is negative except as noted above.  PHYSICAL EXAMINATION: .BP (!) 159/96   Pulse 77   Temp 97.9 F (36.6 C)   Resp 18   Wt 283 lb 8 oz (128.6 kg)   SpO2 98%   BMI 35.44 kg/m  NAD GENERAL:alert, in no acute distress and comfortable SKIN: no acute rashes, no significant lesions EYES: conjunctiva are pink and non-injected, sclera anicteric OROPHARYNX: MMM, no exudates, no oropharyngeal erythema or ulceration NECK: supple, no JVD LYMPH:  no palpable lymphadenopathy in the cervical, axillary or inguinal regions LUNGS: clear to auscultation b/l with normal  respiratory effort HEART: regular rate & rhythm ABDOMEN:  normoactive bowel sounds , non tender, not distended. Extremity: no pedal edema PSYCH: alert & oriented x 3 with fluent speech NEURO: no focal motor/sensory deficits   LABORATORY DATA:  I have reviewed the data as listed  .    Latest Ref Rng & Units 08/20/2022   12:03 PM 02/19/2022   12:11 PM 01/07/2022    6:35 AM  CBC  WBC 4.0 - 10.5 K/uL 8.6  8.3  7.1   Hemoglobin 13.0 - 17.0 g/dL 15.7  15.8  15.3   Hematocrit 39.0 - 52.0 % 44.1  44.7  44.1   Platelets 150 - 400 K/uL 159  146  140    .    Latest Ref Rng & Units 08/20/2022   12:03 PM 02/19/2022   12:11 PM 01/07/2022    6:35 AM  CMP  Glucose 70 - 99 mg/dL 86  87  96   BUN 8 - 23 mg/dL '15  13  15   '$ Creatinine 0.61 - 1.24 mg/dL 0.88  0.95  0.95   Sodium 135 - 145 mmol/L 137  138  135   Potassium 3.5 - 5.1 mmol/L 4.3  4.3  3.8   Chloride 98 - 111 mmol/L 105  104  105   CO2 22 - 32 mmol/L '28  29  20   '$ Calcium 8.9 - 10.3 mg/dL 8.9  9.6  8.8   Total Protein 6.5 - 8.1 g/dL 6.6  6.9    Total Bilirubin 0.3 - 1.2 mg/dL 0.4  0.4    Alkaline Phos 38 - 126 U/L 69  77    AST 15 - 41 U/L 15  13    ALT 0 - 44 U/L 5  5     . Lab Results  Component Value Date   LDH 122 08/20/2022  '  RADIOGRAPHIC STUDIES: I have personally reviewed the radiological images as listed and agreed with the findings in the report. No results found.  CT chest wo contrast ([id number]) done 11/20/2021 revealed "1. Pulmonary nodules bilaterally, similar to the prior study, most concerning of which is a mixed solid and sub solid lesion in the posterior right upper lobe measuring 1.4 x 1.2 cm with 9 mm solid component. 2. Aortic atherosclerosis, in addition to left main and 2 vessel coronary artery disease. Assessment for potential risk factor modification, dietary therapy or pharmacologic therapy may be warranted, if clinically indicated. 3. Hepatic steatosis. 4. Additional incidental findings, as  above. Aortic Atherosclerosis (ICD10-I70.0)."  ASSESSMENT & PLAN:   72 y.o. very pleasant male with   1. Extranodal pulmonary marginal zone lymphoma. Plan -Labs done today were discussed in detail with the patient and shows Normal CBC with no leukocytosis/lymphocytosis and no cytopenias CMP within normal limits LDH within normal limits -Patient has no clinical or lab findings suggestive of significant lymphoma progression at this time. We discussed getting a CT without contrast to evaluate for any interval changes. He did have his ear nose throat examination and was not noted to have any obvious tumors or malignancy.  Follow up:  CT chest in 10 weeks Phone visit with Dr Irene Limbo in 12 weeks   The total time spent in the appointment was 20 minutes*.  All of the patient's questions were answered with apparent satisfaction. The patient knows to call the clinic with any problems, questions or concerns.   Sullivan Lone MD MS AAHIVMS [facility] Hematology/Oncology Physician [facility]  .*Total Encounter Time as defined by the Centers for Medicare and Medicaid Services includes, in addition to the face-to-face time of a patient visit (documented in the note above) non-face-to-face time: obtaining and reviewing outside history, ordering and reviewing medications, tests or procedures, care coordination (communications with other health care professionals or caregivers) and documentation in the medical record.

## 2022-10-29 ENCOUNTER — Ambulatory Visit (HOSPITAL_COMMUNITY)
Admission: RE | Admit: 2022-10-29 | Discharge: 2022-10-29 | Disposition: A | Discharge status: Home / Self Care | Payer: No Typology Code available for payment source | Source: Ambulatory Visit | Attending: Hematology | Admitting: Hematology

## 2022-10-29 DIAGNOSIS — C8580 Other specified types of non-Hodgkin lymphoma, unspecified site: Secondary | ICD-10-CM | POA: Diagnosis present

## 2022-10-30 ENCOUNTER — Ambulatory Visit (INDEPENDENT_AMBULATORY_CARE_PROVIDER_SITE_OTHER): Payer: Federal, State, Local not specified - PPO | Admitting: Family Medicine

## 2022-10-30 VITALS — BP 134/86 | HR 90 | Temp 98.1°F | Resp 16 | Ht 75.0 in | Wt 280.8 lb

## 2022-10-30 DIAGNOSIS — K219 Gastro-esophageal reflux disease without esophagitis: Secondary | ICD-10-CM

## 2022-10-30 DIAGNOSIS — M2550 Pain in unspecified joint: Secondary | ICD-10-CM | POA: Diagnosis not present

## 2022-10-30 DIAGNOSIS — I10 Essential (primary) hypertension: Secondary | ICD-10-CM | POA: Diagnosis not present

## 2022-10-30 DIAGNOSIS — C8590 Non-Hodgkin lymphoma, unspecified, unspecified site: Secondary | ICD-10-CM | POA: Diagnosis not present

## 2022-10-30 DIAGNOSIS — Z7689 Persons encountering health services in other specified circumstances: Secondary | ICD-10-CM

## 2022-10-30 NOTE — Progress Notes (Signed)
Patient is here to established care with provider today. Patient has many health concern they would like to discuss with provider today  Care gaps discuss at appointment today  

## 2022-10-30 NOTE — Progress Notes (Signed)
New Patient Office Visit  Subjective    Patient ID: Chad Knapp, male    DOB: [birth year]  Age: 72 y.o. MRN: [id number]  CC:  Chief Complaint  Patient presents with   Establish Care    HPI Chad Knapp presents to establish care and for review of chronic med issues. Patient denies acute complaints. [facility]   [facility] Encounter Medications as of 10/30/2022  Medication Sig   acetaminophen (TYLENOL) 500 MG tablet Take 1,000 mg by mouth every 6 (six) hours as needed for moderate pain.   albuterol (PROVENTIL HFA;VENTOLIN HFA) 108 (90 BASE) MCG/ACT inhaler Inhale 2 puffs into the lungs every 4 (four) hours as needed for wheezing or shortness of breath.   Budeson-Glycopyrrol-Formoterol (BREZTRI AEROSPHERE) 160-9-4.8 MCG/ACT AERO Inhale 2 puffs into the lungs in the morning and at bedtime.   camphor-menthol (SARNA) lotion Apply 1 Application topically as needed for itching.   carbidopa-levodopa (SINEMET IR) 25-100 MG tablet Take 2.5 tablets by mouth 3 (three) times daily.   famotidine (PEPCID) 40 MG tablet TAKE ONE TABLET BY MOUTH DAILY FOR ACID REFLUX - REPLACES PANTOPRAZOLE   fluticasone (FLONASE) 50 MCG/ACT nasal spray Place 2 sprays into the nose daily as needed for rhinitis or allergies.   gabapentin (NEURONTIN) 100 MG capsule Take 200 mg by mouth 3 (three) times daily.   ibuprofen (ADVIL) 800 MG tablet Take 800 mg by mouth every 8 (eight) hours as needed for moderate pain.   Lactobacillus (ACIDOPHILUS/BIFIDUS PO) Take 2 tablets by mouth daily.   loratadine (ALLERGY) 10 MG tablet Take 10 mg by mouth daily. [employer]   losartan (COZAAR) 100 MG tablet Take 50 mg by mouth daily.   methocarbamol (ROBAXIN) 500 MG tablet Take 500 mg by mouth every 8 (eight) hours as needed for muscle spasms.   Multiple Vitamin (MULTIVITAMIN WITH MINERALS) TABS tablet Take 1 tablet by mouth daily.   ofloxacin (OCUFLOX) 0.3 % ophthalmic solution Place 1 drop into the  left eye 4 (four) times daily.   Omega-3 Fatty Acids (OMEGA 3 PO) Take 600 mg by mouth daily.   pantoprazole (PROTONIX) 40 MG tablet Take 40 mg by mouth daily.   propranolol (INDERAL) 20 MG tablet Take 20 mg by mouth 3 (three) times daily.   rasagiline (AZILECT) 1 MG TABS tablet TAKE ONE TABLET BY MOUTH ONCE A DAY TO SLOW PARKINSON'S DISEASE - NOT A DOSE CHANGE   tamsulosin (FLOMAX) 0.4 MG CAPS capsule Take 0.4 mg by mouth daily.   Tiotropium Bromide-Olodaterol (STIOLTO RESPIMAT) 2.5-2.5 MCG/ACT AERS Inhale 2 puffs into the lungs daily.   Tiotropium Bromide-Olodaterol (STIOLTO RESPIMAT) 2.5-2.5 MCG/ACT AERS Inhale 2 puffs into the lungs daily.   venlafaxine XR (EFFEXOR-XR) 37.5 MG 24 hr capsule Take 37.5 mg by mouth daily.   No facility-administered encounter medications on file as of 10/30/2022.    Past Medical History:  Diagnosis Date   Anxiety    Arthritis    Cancer ([facility])    basal cell -removed - back, shoulder and chest, squamous cell on nose - removed   COVID 12/2021   no symptoms per pt.   Depression    GERD (gastroesophageal reflux disease)    History of kidney stones    Hx of transfusion of packed red blood cells 06/23/1972   Hypertension    Pneumonia    Shortness of breath    Sleep apnea    cpap    Past Surgical History:  Procedure  Laterality Date   ANTERIOR CERVICAL DECOMP/DISCECTOMY FUSION N/A 02/03/2013   Procedure: ACDF C6 - 7     1 LEVEL;  Surgeon: Venita Lick, MD;  Location: [facility];  Service: Orthopedics;  Laterality: N/A;   APPENDECTOMY     BASAL CELL CARCINOMA EXCISION     BRONCHIAL BIOPSY  01/07/2022   Procedure: BRONCHIAL BIOPSIES;  Surgeon: Josephine Igo, DO;  Location: [facility];  Service: Pulmonary;;   BRONCHIAL NEEDLE ASPIRATION BIOPSY  01/07/2022   Procedure: BRONCHIAL NEEDLE ASPIRATION BIOPSIES;  Surgeon: Josephine Igo, DO;  Location: [facility];  Service: Pulmonary;;   CERVICAL SPINE SURGERY  02/03/2013   Dr Shon Baton   FRACTURE SURGERY Left     tib/fib/femur   TESTICLE REMOVAL Left    TONSILLECTOMY      Family History  Problem Relation Age of Onset   Mental illness Mother     Social History   Socioeconomic History   Marital status: Married    Spouse name: Not on file   Number of children: Not on file   Years of education: Not on file   Highest education level: Not on file  Occupational History   Not on file  Tobacco Use   Smoking status: Former    Packs/day: 0.50    Years: 36.00    Additional pack years: 0.00    Total pack years: 18.00    Types: Cigarettes    Start date: 38    Quit date: 06/20/2007    Years since quitting: 15.3   Smokeless tobacco: Never  Vaping Use   Vaping Use: Never used  Substance and Sexual Activity   Alcohol use: Yes    Comment: maybe 2 beers a week   Drug use: Not Currently    Types: Marijuana    Comment: 01/06/22 - none for 6 months   Sexual activity: Yes  Other Topics Concern   Not on file  Social History Narrative   Not on file   Social Determinants of Health   Financial Resource Strain: Not on file  Food Insecurity: Not on file  Transportation Needs: Not on file  Physical Activity: Not on file  Stress: Not on file  Social Connections: Not on file  Intimate Partner Violence: Not on file    Review of Systems  All other systems reviewed and are negative.       Objective    BP 134/86   Pulse 90   Temp 98.1 F (36.7 C) (Oral)   Resp 16   Ht 6\' 3"  (1.905 m)   Wt 280 lb 12.8 oz (127.4 kg)   SpO2 95%   BMI 35.10 kg/m   Physical Exam Vitals and nursing note reviewed.  Constitutional:      General: He is not in acute distress.    Appearance: He is obese.  Cardiovascular:     Rate and Rhythm: Normal rate and regular rhythm.  Pulmonary:     Effort: Pulmonary effort is normal.     Breath sounds: Normal breath sounds.  Abdominal:     Palpations: Abdomen is soft.     Tenderness: There is no abdominal tenderness.  Neurological:     General: No focal  deficit present.     Mental Status: He is alert and oriented to person, place, and time.         Assessment & Plan:   1. Essential hypertension Appears stable. Continue and monitor  2. Gastroesophageal reflux disease, unspecified whether esophagitis present Continue  3. Non-Hodgkin's lymphoma, unspecified body region, unspecified non-Hodgkin lymphoma type (HCC) Management per consultant  4. Arthralgia, unspecified joint Continue   5. Encounter to establish care   No follow-ups on file.   Tommie Raymond, MD

## 2022-11-05 ENCOUNTER — Encounter: Payer: Self-pay | Admitting: Family Medicine

## 2022-11-10 ENCOUNTER — Inpatient Hospital Stay: Payer: No Typology Code available for payment source | Attending: Hematology | Admitting: Hematology

## 2022-11-10 ENCOUNTER — Telehealth: Payer: Federal, State, Local not specified - PPO | Admitting: Hematology

## 2022-11-10 DIAGNOSIS — C884 Extranodal marginal zone B-cell lymphoma of mucosa-associated lymphoid tissue [MALT-lymphoma]: Secondary | ICD-10-CM | POA: Insufficient documentation

## 2022-11-10 DIAGNOSIS — Z8616 Personal history of COVID-19: Secondary | ICD-10-CM | POA: Insufficient documentation

## 2022-11-10 DIAGNOSIS — Z87891 Personal history of nicotine dependence: Secondary | ICD-10-CM | POA: Diagnosis not present

## 2022-11-10 DIAGNOSIS — C8580 Other specified types of non-Hodgkin lymphoma, unspecified site: Secondary | ICD-10-CM

## 2022-11-11 ENCOUNTER — Telehealth: Payer: Self-pay | Admitting: Hematology

## 2022-11-16 NOTE — Progress Notes (Signed)
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 male who has been referred to [place] by Dr Skipper Cliche, Lenn Sink for evaluation and consultation of extranodal pulmonary marginal zone lymphoma. He reports He is doing well with no new symptoms or concerns.  He notes history of SOB for about 5-6 years.   History of smoking for 37 years previously. He smoked a pack over 3-4 days. He notes he quit smoking 06/20/2007.  We discussed getting a full body PET scan for further evaluation which he is agreeable to.  He uses a CPAP that he has had for 10 years.   He notes that he has been attempting to lose weight.   We discussed getting baseline labs today for further evaluation which he is agreeable to.  No history of cardiovascular issues.   Previous COVID-19 infection 12/2021 which he notes he experienced no obvious signs or symptoms from.  We discussed that we might take an active surveillance approach but that is to be determined based on labs and PET/CT.  No fever, chills, night sweats. No new lumps, bumps, or lesions/rashes. No abdominal pain or change in bowel habits. No new or unexpected weight loss. No SOB or chest pain. No other new or acute focal symptoms.  We discussed his recent CT chest wo contrast done 11/20/2021.  INTERVAL HISTORY:  .Marland KitchenI connected with Dolphus Jenny on 11/10/2022 at  8:40 AM EDT by telephone visit and verified that I am speaking with the correct person using two identifiers.   I discussed the limitations, risks, security and privacy concerns of performing an evaluation and management service by telemedicine and the availability of in-person appointments. I also  discussed with the patient that there may be a patient responsible charge related to this service. The patient expressed understanding and agreed to proceed.   Other persons participating in the visit and their role in the encounter: none   Patient's location: Home  Provider's location: [facility]   Chief Complaint: Reviewed of CT chest and f/u for pulmonary extranodal Marginal Zone lymphoma.  Patient notes he is well overall and notes no new chest pain, SOB, DOE or any other new pulmoanry symptoms. CT chest results were discussed in details with the patient.   MEDICAL HISTORY:  Past Medical History:  Diagnosis Date   Anxiety    Arthritis    Cancer (HCC)    basal cell -removed - back, shoulder and chest, squamous cell on nose - removed   COVID 12/2021   no symptoms per pt.   Depression    GERD (gastroesophageal reflux disease)    History of kidney stones    Hx of transfusion of packed red blood cells 06/23/1972   Hypertension    Pneumonia    Shortness of breath    Sleep apnea    cpap    SURGICAL HISTORY: Past Surgical History:  Procedure Laterality Date   ANTERIOR CERVICAL DECOMP/DISCECTOMY FUSION N/A 02/03/2013   Procedure: ACDF C6 - 7     1 LEVEL;  Surgeon: Venita Lick, MD;  Location: [facility] OR;  Service: Orthopedics;  Laterality: N/A;   APPENDECTOMY     BASAL CELL CARCINOMA EXCISION     BRONCHIAL BIOPSY  01/07/2022  Procedure: BRONCHIAL BIOPSIES;  Surgeon: Josephine Igo, DO;  Location: [facility];  Service: Pulmonary;;   BRONCHIAL NEEDLE ASPIRATION BIOPSY  01/07/2022   Procedure: BRONCHIAL NEEDLE ASPIRATION BIOPSIES;  Surgeon: Josephine Igo, DO;  Location: [facility];  Service: Pulmonary;;   CERVICAL SPINE SURGERY  02/03/2013   Dr Shon Baton   FRACTURE SURGERY Left    tib/fib/femur   TESTICLE REMOVAL Left    TONSILLECTOMY      SOCIAL HISTORY: Social History   Socioeconomic History   Marital status: Married    Spouse name: Not on file   Number of children: Not  on file   Years of education: Not on file   Highest education level: Not on file  Occupational History   Not on file  Tobacco Use   Smoking status: Former    Packs/day: 0.50    Years: 36.00    Additional pack years: 0.00    Total pack years: 18.00    Types: Cigarettes    Start date: 67    Quit date: 06/20/2007    Years since quitting: 15.4   Smokeless tobacco: Never  Vaping Use   Vaping Use: Never used  Substance and Sexual Activity   Alcohol use: Yes    Comment: maybe 2 beers a week   Drug use: Not Currently    Types: Marijuana    Comment: 01/06/22 - none for 6 months   Sexual activity: Yes  Other Topics Concern   Not on file  Social History Narrative   Not on file   Social Determinants of Health   Financial Resource Strain: Not on file  Food Insecurity: Not on file  Transportation Needs: Not on file  Physical Activity: Not on file  Stress: Not on file  Social Connections: Not on file  Intimate Partner Violence: Not on file    FAMILY HISTORY: Family History  Problem Relation Age of Onset   Mental illness Mother     ALLERGIES:  is allergic to lisinopril.  MEDICATIONS:  Current Outpatient Medications  Medication Sig Dispense Refill   acetaminophen (TYLENOL) 500 MG tablet Take 1,000 mg by mouth every 6 (six) hours as needed for moderate pain.     albuterol (PROVENTIL HFA;VENTOLIN HFA) 108 (90 BASE) MCG/ACT inhaler Inhale 2 puffs into the lungs every 4 (four) hours as needed for wheezing or shortness of breath.     camphor-menthol (SARNA) lotion Apply 1 Application topically as needed for itching.     carbidopa-levodopa (SINEMET IR) 25-100 MG tablet Take 2.5 tablets by mouth 3 (three) times daily.     famotidine (PEPCID) 40 MG tablet TAKE ONE TABLET BY MOUTH DAILY FOR ACID REFLUX - REPLACES PANTOPRAZOLE     fluticasone (FLONASE) 50 MCG/ACT nasal spray Place 2 sprays into the nose daily as needed for rhinitis or allergies.     gabapentin (NEURONTIN) 100 MG  capsule Take 200 mg by mouth 3 (three) times daily.     ibuprofen (ADVIL) 800 MG tablet Take 800 mg by mouth every 8 (eight) hours as needed for moderate pain.     Lactobacillus (ACIDOPHILUS/BIFIDUS PO) Take 2 tablets by mouth daily.     loratadine (ALLERGY) 10 MG tablet Take 10 mg by mouth daily. [employer] brand     losartan (COZAAR) 100 MG tablet Take 50 mg by mouth daily.     methocarbamol (ROBAXIN) 500 MG tablet Take 500 mg by mouth every 8 (eight) hours as needed for muscle spasms.     Multiple  Vitamin (MULTIVITAMIN WITH MINERALS) TABS tablet Take 1 tablet by mouth daily.     ofloxacin (OCUFLOX) 0.3 % ophthalmic solution Place 1 drop into the left eye 4 (four) times daily. 5 mL 0   Omega-3 Fatty Acids (OMEGA 3 PO) Take 600 mg by mouth daily.     propranolol (INDERAL) 20 MG tablet Take 20 mg by mouth 3 (three) times daily.     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     Tiotropium Bromide-Olodaterol (STIOLTO RESPIMAT) 2.5-2.5 MCG/ACT AERS Inhale 2 puffs into the lungs daily. 4 g 2   Tiotropium Bromide-Olodaterol (STIOLTO RESPIMAT) 2.5-2.5 MCG/ACT AERS Inhale 2 puffs into the lungs daily. 1 each 6   venlafaxine XR (EFFEXOR-XR) 37.5 MG 24 hr capsule Take 37.5 mg by mouth daily.     No current facility-administered medications for this visit.    REVIEW OF SYSTEMS:   No chest pain no SOB no DOE. No fevers/chills night sweats  Neg other than that noted above  PHYSICAL EXAMINATION: telemdicine visit   LABORATORY DATA:  I have reviewed the data as listed  .    Latest Ref Rng & Units 08/20/2022   12:03 PM 02/19/2022   12:11 PM 01/07/2022    6:35 AM  CBC  WBC 4.0 - 10.5 K/uL 8.6  8.3  7.1   Hemoglobin 13.0 - 17.0 g/dL 54.0  98.1  19.1   Hematocrit 39.0 - 52.0 % 44.1  44.7  44.1   Platelets 150 - 400 K/uL 159  146  140    .     Latest Ref Rng & Units 08/20/2022   12:03 PM 02/19/2022   12:11 PM 01/07/2022    6:35 AM  CMP  Glucose 70 - 99 mg/dL 86  87  96   BUN 8 - 23 mg/dL 15  13  15    Creatinine 0.61 - 1.24 mg/dL 4.78  2.95  6.21   Sodium 135 - 145 mmol/L 137  138  135   Potassium 3.5 - 5.1 mmol/L 4.3  4.3  3.8   Chloride 98 - 111 mmol/L 105  104  105   CO2 22 - 32 mmol/L 28  29  20    Calcium 8.9 - 10.3 mg/dL 8.9  9.6  8.8   Total Protein 6.5 - 8.1 g/dL 6.6  6.9    Total Bilirubin 0.3 - 1.2 mg/dL 0.4  0.4    Alkaline Phos 38 - 126 U/L 69  77    AST 15 - 41 U/L 15  13    ALT 0 - 44 U/L 5  5     . Lab Results  Component Value Date   LDH 122 08/20/2022  '  RADIOGRAPHIC STUDIES: I have personally reviewed the radiological images as listed and agreed with the findings in the report. CT Chest Wo Contrast  Result Date: 11/01/2022 CLINICAL DATA:  Follow-up pulmonary marginal zone lymphoma EXAM: CT CHEST WITHOUT CONTRAST TECHNIQUE: Multidetector CT imaging of the chest was performed following the standard protocol without IV contrast. RADIATION DOSE REDUCTION: This exam was performed according to the departmental dose-optimization program which includes automated exposure control, adjustment of the mA and/or kV according to patient size and/or use of iterative reconstruction technique. COMPARISON:  PET-CT dated 02/27/2022 FINDINGS: Cardiovascular: The heart is normal in  size. No pericardial effusion. No evidence of thoracic aortic aneurysm. Atherosclerotic calcifications of the arch. Mild coronary atherosclerosis of the LAD. Mediastinum/Nodes: No suspicious mediastinal lymphadenopathy. Visualized thyroid is unremarkable. Lungs/Pleura: 16 mm part solid nodule in the posterior right upper lobe (series 7/image 53), corresponding to the patient's known pulmonary lymphoma, previously 13 mm. 8 mm solid nodule in the medial right lower lobe (series 7/image 72), previously 7 mm. 5 mm nodule inferiorly in the right middle lobe  (series 7/image 115), previously 4 mm. Platelike scarring/atelectasis in the lingula and bilateral lower lobes, left greater than right. No pleural effusion or pneumothorax. Upper Abdomen: Visualized upper abdomen is grossly unremarkable. Spleen is normal in size. Musculoskeletal: Degenerative changes of the visualized thoracolumbar spine. Cervical spine hardware, incompletely visualized. IMPRESSION: 16 mm right upper lobe nodule, corresponding to the patient's known pulmonary lymphoma, mildly progressive. Additional right lung nodules are mildly progressive, likely reflecting additional sites of lymphoma. No suspicious mediastinal lymphadenopathy. Aortic Atherosclerosis (ICD10-I70.0). Electronically Signed   By: Charline Bills M.D.   On: 11/01/2022 03:34    CT chest wo contrast ([id number]) done 11/20/2021 revealed "1. Pulmonary nodules bilaterally, similar to the prior study, most concerning of which is a mixed solid and sub solid lesion in the posterior right upper lobe measuring 1.4 x 1.2 cm with 9 mm solid component. 2. Aortic atherosclerosis, in addition to left main and 2 vessel coronary artery disease. Assessment for potential risk factor modification, dietary therapy or pharmacologic therapy may be warranted, if clinically indicated. 3. Hepatic steatosis. 4. Additional incidental findings, as above. Aortic Atherosclerosis (ICD10-I70.0)."  ASSESSMENT & PLAN:   72 y.o. very pleasant male with   1. 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 Plan -patient has no clinical symptoms of lymphoma progression CT chest 11/01/2022 results were discussed in details - Mild progressive Pulmonary lesions.  Given minimal and asymptomatic nature of pulmonary discussed with patient and discussion was made to continue to monitor the Pulmonary extranodal MZL and hold off on initiating treatent at this time. -if there is progression we discussed RX  with Rituxan +/- ISRT  Follow up: RTC with Dr Candise Che with labs in 6 months  .The total time spent in the appointment was 12 minutes* .  All of the patient's questions were answered with apparent satisfaction. The patient knows to call the clinic with any problems, questions or concerns.   Wyvonnia Lora MD MS AAHIVMS [facility] Hematology/Oncology [facility]  .*Total Encounter Time as defined by the Centers for Medicare and Medicaid Services includes, in addition to the face-to-face time of a patient visit (documented in the note above) non-face-to-face time: obtaining and reviewing outside history, ordering and reviewing medications, tests or procedures, care coordination (communications with other health care professionals or caregivers) and documentation in the medical record.

## 2022-12-01 ENCOUNTER — Ambulatory Visit
Admission: EM | Admit: 2022-12-01 | Discharge: 2022-12-01 | Disposition: A | Discharge status: Home / Self Care | Payer: Federal, State, Local not specified - PPO

## 2022-12-01 DIAGNOSIS — M545 Low back pain, unspecified: Secondary | ICD-10-CM

## 2022-12-01 NOTE — Discharge Instructions (Signed)
Recommend using the lidocaine patches that you have at home and continuing care as you have been.  Follow-up with orthopedic clinic if symptoms persist or worsen.

## 2022-12-01 NOTE — ED Provider Notes (Signed)
[name]    CSN: [id number] Arrival date & time: 12/01/22  0830      History   Chief Complaint Chief Complaint  Patient presents with   Back Pain    HPI Chad Knapp is a 72 y.o. male.   Patient presents with left-sided lower back pain that started about 2 days ago.  Patient reports history of chronic back pain in that area that flares up about 2-3 times a year.  Denies any previous or recent injuries to the area.  Pain does not radiate down the legs.  Reports he has been taking Tylenol, previously prescribed Robaxin, using a back brace, and using heat application with minimal improvement.  Reports that he has a history of degenerative disc disease which causes flareup of the back pain.  He is not followed by orthopedist or spine specialist at this time.  He has previously had cervical spine fusion multiple years ago.  Reports that he has a history of kidney stones as well but states that he has not had any urinary symptoms and no blood in urine so he has a low concern for this.   Back Pain   Past Medical History:  Diagnosis Date   Anxiety    Arthritis    Cancer (HCC)    basal cell -removed - back, shoulder and chest, squamous cell on nose - removed   COVID 12/2021   no symptoms per pt.   Depression    GERD (gastroesophageal reflux disease)    History of kidney stones    Hx of transfusion of packed red blood cells 06/23/1972   Hypertension    Pneumonia    Shortness of breath    Sleep apnea    cpap    Patient Active Problem List   Diagnosis Date Noted   Lung nodule 12/20/2021   Right upper lobe pulmonary nodule 09/18/2021   HEMORRHOIDS, INTERNAL 07/15/2007   RHINITIS 07/15/2007   GASTRITIS 07/15/2007   DUODENITIS WITHOUT MENTION OF HEMORRHAGE 07/15/2007   DIVERTICULOSIS OF COLON 07/15/2007   ARTHRITIS 07/15/2007   COLONIC POLYPS, HYPERPLASTIC, HX OF 07/15/2007    Past Surgical History:  Procedure Laterality Date   ANTERIOR CERVICAL  DECOMP/DISCECTOMY FUSION N/A 02/03/2013   Procedure: ACDF C6 - 7     1 LEVEL;  Surgeon: Venita Lick, MD;  Location: [facility] OR;  Service: Orthopedics;  Laterality: N/A;   APPENDECTOMY     BASAL CELL CARCINOMA EXCISION     BRONCHIAL BIOPSY  01/07/2022   Procedure: BRONCHIAL BIOPSIES;  Surgeon: Josephine Igo, DO;  Location: [facility];  Service: Pulmonary;;   BRONCHIAL NEEDLE ASPIRATION BIOPSY  01/07/2022   Procedure: BRONCHIAL NEEDLE ASPIRATION BIOPSIES;  Surgeon: Josephine Igo, DO;  Location: [facility];  Service: Pulmonary;;   CERVICAL SPINE SURGERY  02/03/2013   Dr Shon Baton   FRACTURE SURGERY Left    tib/fib/femur   TESTICLE REMOVAL Left    TONSILLECTOMY         Home Medications    Prior to Admission medications   Medication Sig Start Date End Date Taking? Authorizing Provider  acetaminophen (TYLENOL) 500 MG tablet Take 1,000 mg by mouth every 6 (six) hours as needed for moderate pain.    [provider]  albuterol (PROVENTIL HFA;VENTOLIN HFA) 108 (90 BASE) MCG/ACT inhaler Inhale 2 puffs into the lungs every 4 (four) hours as needed for wheezing or shortness of breath.    [provider]  camphor-menthol Wynelle Fanny) lotion Apply 1 Application topically  as needed for itching.    [provider]  carbidopa-levodopa (SINEMET IR) 25-100 MG tablet Take 2.5 tablets by mouth 3 (three) times daily. 10/01/20   [provider]  famotidine (PEPCID) 40 MG tablet TAKE ONE TABLET BY MOUTH DAILY FOR ACID REFLUX - REPLACES PANTOPRAZOLE 02/04/22   [provider]  fluticasone (FLONASE) 50 MCG/ACT nasal spray Place 2 sprays into the nose daily as needed for rhinitis or allergies.    [provider]  gabapentin (NEURONTIN) 100 MG capsule Take 200 mg by mouth 3 (three) times daily.    [provider]  ibuprofen (ADVIL) 800 MG tablet Take 800 mg by mouth every 8 (eight) hours as needed for moderate pain. 08/23/21   [provider]   Lactobacillus (ACIDOPHILUS/BIFIDUS PO) Take 2 tablets by mouth daily.    [provider]  loratadine (ALLERGY) 10 MG tablet Take 10 mg by mouth daily. [employer] brand    [provider]  losartan (COZAAR) 100 MG tablet Take 50 mg by mouth daily. 10/31/20   [provider]  methocarbamol (ROBAXIN) 500 MG tablet Take 500 mg by mouth every 8 (eight) hours as needed for muscle spasms. 08/16/21   [provider]  Multiple Vitamin (MULTIVITAMIN WITH MINERALS) TABS tablet Take 1 tablet by mouth daily.    [provider]  ofloxacin (OCUFLOX) 0.3 % ophthalmic solution Place 1 drop into the left eye 4 (four) times daily. 02/14/22   Raspet, Noberto Retort, PA-C  Omega-3 Fatty Acids (OMEGA 3 PO) Take 600 mg by mouth daily.    [provider]  propranolol (INDERAL) 20 MG tablet Take 20 mg by mouth 3 (three) times daily.    [provider]  rasagiline (AZILECT) 1 MG TABS tablet TAKE ONE TABLET BY MOUTH ONCE A DAY TO SLOW PARKINSON'S DISEASE - NOT A DOSE CHANGE 10/29/21   [provider]  tamsulosin (FLOMAX) 0.4 MG CAPS capsule Take 0.4 mg by mouth daily. 10/31/20   [provider]  Tiotropium Bromide-Olodaterol (STIOLTO RESPIMAT) 2.5-2.5 MCG/ACT AERS Inhale 2 puffs into the lungs daily. 10/03/21   Icard, Rachel Bo, DO  Tiotropium Bromide-Olodaterol (STIOLTO RESPIMAT) 2.5-2.5 MCG/ACT AERS Inhale 2 puffs into the lungs daily. 01/17/22   Bevelyn Ngo, NP  venlafaxine XR (EFFEXOR-XR) 37.5 MG 24 hr capsule Take 37.5 mg by mouth daily.    [provider]    Family History Family History  Problem Relation Age of Onset   Mental illness Mother     Social History Social History   Tobacco Use   Smoking status: Former    Packs/day: 0.50    Years: 36.00    Additional pack years: 0.00    Total pack years: 18.00    Types: Cigarettes    Start date: 53    Quit date: 06/20/2007    Years since quitting: 15.4   Smokeless tobacco: Never   Vaping Use   Vaping Use: Never used  Substance Use Topics   Alcohol use: Yes    Comment: maybe 2 beers a week   Drug use: Not Currently    Types: Marijuana    Comment: 01/06/22 - none for 6 months     Allergies   Lisinopril   Review of Systems Review of Systems Per HPI  Physical Exam Triage Vital Signs ED Triage Vitals  Enc Vitals Group     BP 12/01/22 0917 (!) 144/87     Pulse Rate 12/01/22 0917 70  Resp 12/01/22 0917 18     Temp 12/01/22 0917 97.9 F (36.6 C)     Temp Source 12/01/22 0917 Oral     SpO2 12/01/22 0917 94 %     Weight --      Height --      Head Circumference --      Peak Flow --      Pain Score 12/01/22 0921 8     Pain Loc --      Pain Edu? --      Excl. in GC? --    No data found.  Updated Vital Signs BP (!) 144/87 (BP Location: Left Arm)   Pulse 70   Temp 97.9 F (36.6 C) (Oral)   Resp 18   SpO2 97%   Visual Acuity Right Eye Distance:   Left Eye Distance:   Bilateral Distance:    Right Eye Near:   Left Eye Near:    Bilateral Near:     Physical Exam Constitutional:      General: He is not in acute distress.    Appearance: Normal appearance. He is not toxic-appearing or diaphoretic.  HENT:     Head: Normocephalic and atraumatic.  Eyes:     Extraocular Movements: Extraocular movements intact.     Conjunctiva/sclera: Conjunctivae normal.  Pulmonary:     Effort: Pulmonary effort is normal.  Musculoskeletal:       Back:     Comments: Tenderness to palpation to the left lower lumbar region.  There is no direct tenderness, crepitus, step-off noted.  Neurological:     General: No focal deficit present.     Mental Status: He is alert and oriented to person, place, and time. Mental status is at baseline.  Psychiatric:        Mood and Affect: Mood normal.        Behavior: Behavior normal.        Thought Content: Thought content normal.        Judgment: Judgment normal.      UC Treatments / Results  Labs (all labs  ordered are listed, but only abnormal results are displayed) Labs Reviewed - No data to display  EKG   Radiology No results found.  Procedures Procedures (including critical care time)  Medications Ordered in UC Medications - No data to display  Initial Impression / Assessment and Plan / UC Course  I have reviewed the triage vital signs and the nursing notes.  Pertinent labs & imaging results that were available during my care of the patient were reviewed by me and considered in my medical decision making (see chart for details).     Suspect lumbar strain.  Given no direct spinal tenderness or injury, imaging was deferred.  Low suspicion for kidney stone at this time given physical exam and no urinary symptoms.  Although, discussed with patient that if symptoms significantly worsen, he is to go to the ER to have CT scan completed.  Patient to continue Robaxin, Tylenol, alternating ice and heat.  Will avoid prednisone given cancer history. Recommended lidocaine patches and patient reports that he has these at home so encouraged him to see if this will be helpful.  Advised following up with orthopedist at provided contact information if symptoms persist or worsen.  Patient verbalized understanding and was agreeable with plan. Final Clinical Impressions(s) / UC Diagnoses   Final diagnoses:  Acute left-sided low back pain without sciatica     Discharge Instructions  Recommend using the lidocaine patches that you have at home and continuing care as you have been.  Follow-up with orthopedic clinic if symptoms persist or worsen.    ED Prescriptions   None    PDMP not reviewed this encounter.   Gustavus Bryant, [place] 12/01/22 [phone]

## 2022-12-01 NOTE — ED Triage Notes (Signed)
Pt reports left sided low back pain x 2 days. Robaxin gives no relief. Back brace gives some relief.

## 2022-12-06 ENCOUNTER — Emergency Department (HOSPITAL_COMMUNITY): Payer: Federal, State, Local not specified - PPO

## 2022-12-06 ENCOUNTER — Encounter (HOSPITAL_COMMUNITY): Payer: Self-pay | Admitting: *Deleted

## 2022-12-06 ENCOUNTER — Emergency Department (HOSPITAL_COMMUNITY)
Admission: EM | Admit: 2022-12-06 | Discharge: 2022-12-06 | Disposition: A | Discharge status: Home / Self Care | Payer: Federal, State, Local not specified - PPO | Attending: Emergency Medicine | Admitting: Emergency Medicine

## 2022-12-06 ENCOUNTER — Other Ambulatory Visit: Payer: Self-pay

## 2022-12-06 DIAGNOSIS — I10 Essential (primary) hypertension: Secondary | ICD-10-CM | POA: Insufficient documentation

## 2022-12-06 DIAGNOSIS — Z8616 Personal history of COVID-19: Secondary | ICD-10-CM | POA: Diagnosis not present

## 2022-12-06 DIAGNOSIS — M545 Low back pain, unspecified: Secondary | ICD-10-CM | POA: Insufficient documentation

## 2022-12-06 DIAGNOSIS — Z85828 Personal history of other malignant neoplasm of skin: Secondary | ICD-10-CM | POA: Insufficient documentation

## 2022-12-06 DIAGNOSIS — R1032 Left lower quadrant pain: Secondary | ICD-10-CM | POA: Diagnosis not present

## 2022-12-06 DIAGNOSIS — M5410 Radiculopathy, site unspecified: Secondary | ICD-10-CM | POA: Diagnosis not present

## 2022-12-06 DIAGNOSIS — Z79899 Other long term (current) drug therapy: Secondary | ICD-10-CM | POA: Insufficient documentation

## 2022-12-06 LAB — COMPREHENSIVE METABOLIC PANEL
ALT: 21 U/L (ref 0–44)
AST: 17 U/L (ref 15–41)
Albumin: 3.8 g/dL (ref 3.5–5.0)
Alkaline Phosphatase: 58 U/L (ref 38–126)
Anion gap: 8 (ref 5–15)
BUN: 12 mg/dL (ref 8–23)
CO2: 22 mmol/L (ref 22–32)
Calcium: 8.9 mg/dL (ref 8.9–10.3)
Chloride: 106 mmol/L (ref 98–111)
Creatinine, Ser: 0.89 mg/dL (ref 0.61–1.24)
GFR, Estimated: >60 mL/min (ref >60)
Glucose, Bld: 98 mg/dL (ref 70–99)
Potassium: 3.7 mmol/L (ref 3.5–5.1)
Sodium: 136 mmol/L (ref 135–145)
Total Bilirubin: 0.9 mg/dL (ref 0.3–1.2)
Total Protein: 7.4 g/dL (ref 6.5–8.1)

## 2022-12-06 LAB — URINALYSIS, W/ REFLEX TO CULTURE (INFECTION SUSPECTED)
Bilirubin Urine: NEGATIVE
Glucose, UA: NEGATIVE mg/dL
Hgb urine dipstick: NEGATIVE
Ketones, ur: NEGATIVE mg/dL
Nitrite: NEGATIVE
Protein, ur: NEGATIVE mg/dL
Specific Gravity, Urine: 1.014 (ref 1.005–1.030)
pH: 5 (ref 5.0–8.0)

## 2022-12-06 LAB — CBC WITH DIFFERENTIAL/PLATELET
Abs Immature Granulocytes: 0.04 10*3/uL (ref 0.00–0.07)
Basophils Absolute: 0 10*3/uL (ref 0.0–0.1)
Basophils Relative: 0 %
Eosinophils Absolute: 0.3 10*3/uL (ref 0.0–0.5)
Eosinophils Relative: 4 %
HCT: 43.7 % (ref 39.0–52.0)
Hemoglobin: 15 g/dL (ref 13.0–17.0)
Immature Granulocytes: 1 %
Lymphocytes Relative: 22 %
Lymphs Abs: 1.5 10*3/uL (ref 0.7–4.0)
MCH: 31.2 pg (ref 26.0–34.0)
MCHC: 34.3 g/dL (ref 30.0–36.0)
MCV: 90.9 fL (ref 80.0–100.0)
Monocytes Absolute: 0.6 10*3/uL (ref 0.1–1.0)
Monocytes Relative: 9 %
Neutro Abs: 4.4 10*3/uL (ref 1.7–7.7)
Neutrophils Relative %: 64 %
Platelets: 168 10*3/uL (ref 150–400)
RBC: 4.81 MIL/uL (ref 4.22–5.81)
RDW: 12.6 % (ref 11.5–15.5)
WBC: 6.8 10*3/uL (ref 4.0–10.5)
nRBC: 0 % (ref 0.0–0.2)

## 2022-12-06 MED ORDER — ACETAMINOPHEN 325 MG PO TABS
650.0000 mg | ORAL_TABLET | Freq: Once | ORAL | Status: AC
  Administered 2022-12-06: 650 mg via ORAL
  Filled 2022-12-06: qty 2

## 2022-12-06 MED ORDER — LOSARTAN POTASSIUM 25 MG PO TABS
50.0000 mg | ORAL_TABLET | Freq: Once | ORAL | Status: AC
  Administered 2022-12-06: 50 mg via ORAL
  Filled 2022-12-06: qty 2

## 2022-12-06 MED ORDER — OXYCODONE HCL 5 MG PO TABS: 5.0000 mg | ORAL_TABLET | ORAL | 0 refills | Status: DC | PRN

## 2022-12-06 MED ORDER — IOHEXOL 300 MG/ML  SOLN
100.0000 mL | Freq: Once | INTRAMUSCULAR | Status: AC | PRN
  Administered 2022-12-06: 100 mL via INTRAVENOUS

## 2022-12-06 MED ORDER — OXYCODONE HCL 5 MG PO TABS
5.0000 mg | ORAL_TABLET | Freq: Once | ORAL | Status: AC
  Administered 2022-12-06: 5 mg via ORAL
  Filled 2022-12-06: qty 1

## 2022-12-06 MED ORDER — METHYLPREDNISOLONE 4 MG PO TBPK: ORAL_TABLET | ORAL | 0 refills | Status: DC

## 2022-12-06 MED ORDER — KETOROLAC TROMETHAMINE 30 MG/ML IJ SOLN
30.0000 mg | Freq: Once | INTRAMUSCULAR | Status: AC
  Administered 2022-12-06: 30 mg via INTRAVENOUS
  Filled 2022-12-06: qty 1

## 2022-12-06 NOTE — ED Provider Notes (Signed)
[place] EMERGENCY DEPARTMENT AT [facility] Provider Note   CSN: [id number] Arrival date & time: 12/06/22  4098     History {Add pertinent medical, surgical, social history, OB history to HPI:1} No chief complaint on file.   Chad Knapp is a 72 y.o. male.  HPI    72 year old male with a history of hypertension, OSA, non-Hodgkin's lymphoma, back pain  Sunday began as low back pain Wednesday developed LLQ abdominal pain, sharp, relatively constant, worse with movements. The back pain feels similar but the abdominal pain is new.  Had kidney stone in past. No blood in the urine. No fevers, vomiting, diarrhea. Has had constipation.  Walking barefoot makes it worse, turning legs different ways makes it worse.    Lidocaine patch, methocarbamol, acetaminophen   Past Medical History:  Diagnosis Date   Anxiety    Arthritis    Cancer (HCC)    basal cell -removed - back, shoulder and chest, squamous cell on nose - removed   COVID 12/2021   no symptoms per pt.   Depression    GERD (gastroesophageal reflux disease)    History of kidney stones    Hx of transfusion of packed red blood cells 06/23/1972   Hypertension    Pneumonia    Shortness of breath    Sleep apnea    cpap     Home Medications Prior to Admission medications   Medication Sig Start Date End Date Taking? Authorizing Provider  acetaminophen (TYLENOL) 500 MG tablet Take 1,000 mg by mouth every 6 (six) hours as needed for moderate pain.    [provider]  albuterol (PROVENTIL HFA;VENTOLIN HFA) 108 (90 BASE) MCG/ACT inhaler Inhale 2 puffs into the lungs every 4 (four) hours as needed for wheezing or shortness of breath.    [provider]  camphor-menthol Wynelle Fanny) lotion Apply 1 Application topically as needed for itching.    [provider]  carbidopa-levodopa (SINEMET IR) 25-100 MG tablet Take 2.5 tablets by mouth 3 (three) times daily. 10/01/20   [provider]   famotidine (PEPCID) 40 MG tablet TAKE ONE TABLET BY MOUTH DAILY FOR ACID REFLUX - REPLACES PANTOPRAZOLE 02/04/22   [provider]  fluticasone (FLONASE) 50 MCG/ACT nasal spray Place 2 sprays into the nose daily as needed for rhinitis or allergies.    [provider]  gabapentin (NEURONTIN) 100 MG capsule Take 200 mg by mouth 3 (three) times daily.    [provider]  ibuprofen (ADVIL) 800 MG tablet Take 800 mg by mouth every 8 (eight) hours as needed for moderate pain. 08/23/21   [provider]  Lactobacillus (ACIDOPHILUS/BIFIDUS PO) Take 2 tablets by mouth daily.    [provider]  loratadine (ALLERGY) 10 MG tablet Take 10 mg by mouth daily. [employer] brand    [provider]  losartan (COZAAR) 100 MG tablet Take 50 mg by mouth daily. 10/31/20   [provider]  methocarbamol (ROBAXIN) 500 MG tablet Take 500 mg by mouth every 8 (eight) hours as needed for muscle spasms. 08/16/21   [provider]  Multiple Vitamin (MULTIVITAMIN WITH MINERALS) TABS tablet Take 1 tablet by mouth daily.    [provider]  ofloxacin (OCUFLOX) 0.3 % ophthalmic solution Place 1 drop into the left eye 4 (four) times daily. 02/14/22   Raspet, Noberto Retort, PA-C  Omega-3 Fatty Acids (OMEGA 3 PO) Take 600 mg by mouth daily.    [provider]  propranolol (INDERAL)  20 MG tablet Take 20 mg by mouth 3 (three) times daily.    [provider]  rasagiline (AZILECT) 1 MG TABS tablet TAKE ONE TABLET BY MOUTH ONCE A DAY TO SLOW PARKINSON'S DISEASE - NOT A DOSE CHANGE 10/29/21   [provider]  tamsulosin (FLOMAX) 0.4 MG CAPS capsule Take 0.4 mg by mouth daily. 10/31/20   [provider]  Tiotropium Bromide-Olodaterol (STIOLTO RESPIMAT) 2.5-2.5 MCG/ACT AERS Inhale 2 puffs into the lungs daily. 10/03/21   Icard, Rachel Bo, DO  Tiotropium Bromide-Olodaterol (STIOLTO RESPIMAT) 2.5-2.5 MCG/ACT AERS Inhale 2 puffs into the lungs  daily. 01/17/22   Bevelyn Ngo, NP  venlafaxine XR (EFFEXOR-XR) 37.5 MG 24 hr capsule Take 37.5 mg by mouth daily.    [provider]      Allergies    Lisinopril    Review of Systems   Review of Systems  Physical Exam Updated Vital Signs BP (!) 156/121 (BP Location: Right Arm)   Pulse 72   Temp 97.6 F (36.4 C) (Oral)   Resp 16   Ht 6\' 3"  (1.905 m)   Wt 120.7 kg   SpO2 98%   BMI 33.25 kg/m  Physical Exam  ED Results / Procedures / Treatments   Labs (all labs ordered are listed, but only abnormal results are displayed) Labs Reviewed - No data to display  EKG None  Radiology No results found.  Procedures Procedures  {Document cardiac monitor, telemetry assessment procedure when appropriate:1}  Medications Ordered in ED Medications - No data to display  ED Course/ Medical Decision Making/ A&P   {   Click here for ABCD2, HEART and other calculatorsREFRESH Note before signing :1}                          Medical Decision Making Amount and/or Complexity of Data Reviewed Labs: ordered. Radiology: ordered.  Risk OTC drugs. Prescription drug management.   ***  Labs completed and personally read interpreted by me show 6-10 white blood cells, less consistent with UTI.  CBC with normal hemoglobin, no leukocytosis, CMP with normal creatinine and electrolytes  .  CT abdomen pelvis ordered to evaluate for left lower quadrant pain shows normal caliber abdominal aorta, no versus, there is a 1.9 cm soft tissue nodule within the right perinephric fat posteriorly which is nonspecific, redemonstrated bandlike consolidation in the left lower lung, redemonstrated right lower lobe pulmonary nodule, punctate right kidney stone in the inferior pole.  Suspect the pain in the back and abdomen is secondary to spinal pathology most likely in setting of CT  {Document critical care time when appropriate:1} {Document review of labs and clinical decision tools ie heart  score, Chads2Vasc2 etc:1}  {Document your independent review of radiology images, and any outside records:1} {Document your discussion with family members, caretakers, and with consultants:1} {Document social determinants of health affecting pt's care:1} {Document your decision making why or why not admission, treatments were needed:1} Final Clinical Impression(s) / ED Diagnoses Final diagnoses:  None    Rx / DC Orders ED Discharge Orders     None

## 2022-12-06 NOTE — ED Notes (Signed)
Pt aware that a urine sample is needed. Urinal placed at bedside.

## 2022-12-06 NOTE — ED Triage Notes (Signed)
Last Sunday, work with lower back pain lower left. Dx in past herniated disc. Has been taking muscle relaxer with Lidocaine patch. Wednesday pain in left groin area. Can walk better with shoes on, unable to walk without them. Pain is throbbing in groin.

## 2022-12-07 MED ORDER — OXYCODONE HCL 5 MG PO TABS: 5.0000 mg | ORAL_TABLET | Freq: Four times a day (QID) | ORAL | 0 refills | Status: DC | PRN

## 2022-12-08 ENCOUNTER — Ambulatory Visit: Payer: Self-pay

## 2022-12-08 ENCOUNTER — Telehealth: Payer: Self-pay | Admitting: Family Medicine

## 2022-12-08 NOTE — Telephone Encounter (Signed)
  Chief Complaint: abdominal pain Symptoms: LLQ abdominal pain, 9/10 constant  Frequency: since Thursday Pertinent Negatives: NA Disposition: [] ED /[] Urgent Care (no appt availability in office) / [x] Appointment(In office/virtual)/ []  [place] Virtual Care/ [] Home Care/ [] Refused Recommended Disposition /[]  Mobile Bus/ []  Follow-up with PCP Additional Notes: pt calling to schedule FU from ED visit on 12/06/22 for back pain, pt states they told him at [facility] ED that his back pain could be causing the LLQ abdominal pain. Pt had CT as well. Pt states he hasn't started prednisone and has took 1 oxycodone so far since he just got rxs yesterday. Scheduled pt for OV 12/10/22 at 1540 with Amy, NP and advised pt if pain gets worse before OV to go to ED or can call Ortho UC as instructed on AVS. Also recommended pt start prednisone to see if that would help with pain. Pt verbalized understanding.  Reason for Disposition  [1] MODERATE pain (e.g., interferes with normal activities) AND [2] pain comes and goes (cramps) AND [3] present > 24 hours  (Exception: Pain with Vomiting or Diarrhea - see that Guideline.)  Answer Assessment - Initial Assessment Questions 1. LOCATION: "Where does it hurt?"      LLQ abdomen  2. RADIATION: "Does the pain shoot anywhere else?" (e.g., chest, back)     no 3. ONSET: "When did the pain begin?" (Minutes, hours or days ago)      Since Thursday 5. PATTERN "Does the pain come and go, or is it constant?"    - If it comes and goes: "How long does it last?" "Do you have pain now?"     (Note: Comes and goes means the pain is intermittent. It goes away completely between bouts.)    - If constant: "Is it getting better, staying the same, or getting worse?"      (Note: Constant means the pain never goes away completely; most serious pain is constant and gets worse.)      Constant  6. SEVERITY: "How bad is the pain?"  (e.g., Scale 1-10; mild, moderate, or severe)    - MILD  (1-3): Doesn't interfere with normal activities, abdomen soft and not tender to touch.     - MODERATE (4-7): Interferes with normal activities or awakens from sleep, abdomen tender to touch.     - SEVERE (8-10): Excruciating pain, doubled over, unable to do any normal activities.       9/10 9. RELIEVING/AGGRAVATING FACTORS: "What makes it better or worse?" (e.g., antacids, bending or twisting motion, bowel movement)     *No Answer* 10. OTHER SYMPTOMS: "Do you have any other symptoms?" (e.g., back pain, diarrhea, fever, urination pain, vomiting)       *No Answer*  Protocols used: Abdominal Pain - Male-A-AH

## 2022-12-08 NOTE — Telephone Encounter (Signed)
Pt is calling for a hospital follow up LLQ abdominal pain, sharp, relatively constant, worse with movements. No available appts until 01/15/23. Please advise [phone]

## 2022-12-09 NOTE — Progress Notes (Unsigned)
Patient ID: Chad Knapp, male    DOB: 1950-11-06  MRN: [id number]  CC: Emergency Department Follow-Up  Subjective: Chad Knapp is a 72 y.o. male who presents for Emergency Department follow-up.  His concerns today include:  12/06/2022 [facility] Emergency Department at [facility] per MD note: ED Course/ Medical Decision Making/ A&P   72 year old male with a history of hypertension, OSA, non-Hodgkin's lymphoma, back pain who presents with concern for back/abdominal pain.     DDx includes appendicitis, pancreatitis, cholecystitis, pyelonephritis, nephrolithiasis, diverticulitis, AAA, dissection, musculoskeletal/spinal etiology.   Labs completed and personally read interpreted by me show 6-10 white blood cells, less consistent with UTI.  CBC with normal hemoglobin, no leukocytosis, CMP with normal creatinine and electrolytes   CT abdomen pelvis ordered to evaluate for left lower quadrant pain shows normal caliber abdominal aorta, no versus, there is a 1.9 cm soft tissue nodule within the right perinephric fat posteriorly which is nonspecific, redemonstrated bandlike consolidation in the left lower lung, redemonstrated right lower lobe pulmonary nodule, punctate right kidney stone in the inferior pole.   Suspect the pain in the back and abdomen is secondary to spinal pathology most likely in setting of CT without acute pathology, normal bilateral upper and lower extremity pulses.  Given cancer hx, feel outpatient MRI appropriate-do not feel if obtained today would change disposition.  Also consider possibility of iliopsoas strain.  Given rx for steroids and oxycodone for pain after review in [place] drug database. Patient discharged in stable condition with understanding of reasons to return.    Follow-Ups  Schedule an appointment with [name], MD (Family Medicine); consider outpatient MRI for more sensitve evaluation of lumbar spine Follow up with [facility] Emergency  Department at [facility] (Emergency Medicine); If symptoms worsen  12/08/2022 per triage RN call note: Chief Complaint: abdominal pain Symptoms: LLQ abdominal pain, 9/10 constant  Frequency: since Thursday Pertinent Negatives: NA Disposition: [] ED /[] Urgent Care (no appt availability in office) / [x] Appointment(In office/virtual)/ []  [place] Virtual Care/ [] Home Care/ [] Refused Recommended Disposition /[] [place] Mobile Bus/ []  Follow-up with PCP Additional Notes: pt calling to schedule FU from ED visit on 12/06/22 for back pain, pt states they told him at [facility] ED that his back pain could be causing the LLQ abdominal pain. Pt had CT as well. Pt states he hasn't started prednisone and has took 1 oxycodone so far since he just got rxs yesterday. Scheduled pt for OV 12/10/22 at 1540 with Kaisa Wofford, NP and advised pt if pain gets worse before OV to go to ED or can call Ortho UC as instructed on AVS. Also recommended pt start prednisone to see if that would help with pain. Pt verbalized understanding.   Reason for Disposition  [1] MODERATE pain (e.g., interferes with normal activities) AND [2] pain comes and goes (cramps) AND [3] present > 24 hours  (Exception: Pain with Vomiting or Diarrhea - see that Guideline.)  Answer Assessment - Initial Assessment Questions 1. LOCATION: "Where does it hurt?"      LLQ abdomen  2. RADIATION: "Does the pain shoot anywhere else?" (e.g., chest, back)     no 3. ONSET: "When did the pain begin?" (Minutes, hours or days ago)      Since Thursday 5. PATTERN "Does the pain come and go, or is it constant?"    - If it comes and goes: "How long does it last?" "Do you have pain now?"     (Note: Comes  and goes means the pain is intermittent. It goes away completely between bouts.)    - If constant: "Is it getting better, staying the same, or getting worse?"      (Note: Constant means the pain never goes away completely; most serious pain is constant and gets worse.)       Constant  6. SEVERITY: "How bad is the pain?"  (e.g., Scale 1-10; mild, moderate, or severe)    - MILD (1-3): Doesn't interfere with normal activities, abdomen soft and not tender to touch.     - MODERATE (4-7): Interferes with normal activities or awakens from sleep, abdomen tender to touch.     - SEVERE (8-10): Excruciating pain, doubled over, unable to do any normal activities.       9/10 9. RELIEVING/AGGRAVATING FACTORS: "What makes it better or worse?" (e.g., antacids, bending or twisting motion, bowel movement)     *No Answer* 10. OTHER SYMPTOMS: "Do you have any other symptoms?" (e.g., back pain, diarrhea, fever, urination pain, vomiting)       *No Answer*  Protocols used: Abdominal Pain - Male-A-AH  Today's visit 12/10/2022: Low back pain persisting since Emergency Department visit. Taking Prednisone as prescribed. Using Oxycodone only as needed. Wearing a back brace to help with pain. Reports frequent history of kidney stones. States abdominal pain "feels like when I had a kidney stone". Reports urine appears normal. Reports he is established with Oncology for management of non-Hodgkin's lymphoma and lung nodule. No further issues/concerns for discussion today.   Patient Active Problem List   Diagnosis Date Noted   Lung nodule 12/20/2021   Right upper lobe pulmonary nodule 09/18/2021   HEMORRHOIDS, INTERNAL 07/15/2007   RHINITIS 07/15/2007   GASTRITIS 07/15/2007   DUODENITIS WITHOUT MENTION OF HEMORRHAGE 07/15/2007   DIVERTICULOSIS OF COLON 07/15/2007   ARTHRITIS 07/15/2007   COLONIC POLYPS, HYPERPLASTIC, HX OF 07/15/2007     Current Outpatient Medications on File Prior to Visit  Medication Sig Dispense Refill   acetaminophen (TYLENOL) 500 MG tablet Take 1,000 mg by mouth every 6 (six) hours as needed for moderate pain.     albuterol (PROVENTIL HFA;VENTOLIN HFA) 108 (90 BASE) MCG/ACT inhaler Inhale 2 puffs into the lungs every 4 (four) hours as needed for wheezing or  shortness of breath.     camphor-menthol (SARNA) lotion Apply 1 Application topically as needed for itching.     carbidopa-levodopa (SINEMET IR) 25-100 MG tablet Take 2.5 tablets by mouth 3 (three) times daily.     famotidine (PEPCID) 40 MG tablet TAKE ONE TABLET BY MOUTH DAILY FOR ACID REFLUX - REPLACES PANTOPRAZOLE     fluticasone (FLONASE) 50 MCG/ACT nasal spray Place 2 sprays into the nose daily as needed for rhinitis or allergies.     ibuprofen (ADVIL) 800 MG tablet Take 800 mg by mouth every 8 (eight) hours as needed for moderate pain.     Lactobacillus (ACIDOPHILUS/BIFIDUS PO) Take 2 tablets by mouth daily.     loratadine (ALLERGY) 10 MG tablet Take 10 mg by mouth daily. [employer] brand     losartan (COZAAR) 100 MG tablet Take 50 mg by mouth daily.     methocarbamol (ROBAXIN) 500 MG tablet Take 500 mg by mouth every 8 (eight) hours as needed for muscle spasms.     methylPREDNISolone (MEDROL DOSEPAK) 4 MG TBPK tablet Regarding symptoms 21 each 0   Multiple Vitamin (MULTIVITAMIN WITH MINERALS) TABS tablet Take 1 tablet by mouth daily.     ofloxacin (OCUFLOX)  0.3 % ophthalmic solution Place 1 drop into the left eye 4 (four) times daily. 5 mL 0   Omega-3 Fatty Acids (OMEGA 3 PO) Take 600 mg by mouth daily.     oxyCODONE (ROXICODONE) 5 MG immediate release tablet Take 1 tablet (5 mg total) by mouth every 6 (six) hours as needed for severe pain. 30 tablet 0   propranolol (INDERAL) 20 MG tablet Take 20 mg by mouth 3 (three) times daily.     rasagiline (AZILECT) 1 MG TABS tablet TAKE ONE TABLET BY MOUTH ONCE A DAY TO SLOW PARKINSON'S DISEASE - NOT A DOSE CHANGE     Tiotropium Bromide-Olodaterol (STIOLTO RESPIMAT) 2.5-2.5 MCG/ACT AERS Inhale 2 puffs into the lungs daily. 4 g 2   Tiotropium Bromide-Olodaterol (STIOLTO RESPIMAT) 2.5-2.5 MCG/ACT AERS Inhale 2 puffs into the lungs daily. 1 each 6   gabapentin (NEURONTIN) 100 MG capsule Take 200 mg by mouth 3 (three) times daily. (Patient not taking:  Reported on 12/10/2022)     venlafaxine XR (EFFEXOR-XR) 37.5 MG 24 hr capsule Take 37.5 mg by mouth daily. (Patient not taking: Reported on 12/10/2022)     No current facility-administered medications on file prior to visit.    Allergies  Allergen Reactions   Lisinopril Other (See Comments)    Social History   Socioeconomic History   Marital status: Married    Spouse name: Not on file   Number of children: Not on file   Years of education: Not on file   Highest education level: Not on file  Occupational History   Not on file  Tobacco Use   Smoking status: Former    Packs/day: 0.50    Years: 36.00    Additional pack years: 0.00    Total pack years: 18.00    Types: Cigarettes    Start date: 68    Quit date: 06/20/2007    Years since quitting: 15.4   Smokeless tobacco: Never  Vaping Use   Vaping Use: Never used  Substance and Sexual Activity   Alcohol use: Yes    Comment: maybe 2 beers a week   Drug use: Not Currently    Types: Marijuana    Comment: 01/06/22 - none for 6 months   Sexual activity: Yes  Other Topics Concern   Not on file  Social History Narrative   Not on file   Social Determinants of Health   Financial Resource Strain: Not on file  Food Insecurity: Not on file  Transportation Needs: Not on file  Physical Activity: Not on file  Stress: Not on file  Social Connections: Not on file  Intimate Partner Violence: Not on file    Family History  Problem Relation Age of Onset   Mental illness Mother     Past Surgical History:  Procedure Laterality Date   ANTERIOR CERVICAL DECOMP/DISCECTOMY FUSION N/A 02/03/2013   Procedure: ACDF C6 - 7     1 LEVEL;  Surgeon: Venita Lick, MD;  Location: [facility] OR;  Service: Orthopedics;  Laterality: N/A;   APPENDECTOMY     BASAL CELL CARCINOMA EXCISION     BRONCHIAL BIOPSY  01/07/2022   Procedure: BRONCHIAL BIOPSIES;  Surgeon: Josephine Igo, DO;  Location: [facility] ENDOSCOPY;  Service: Pulmonary;;   BRONCHIAL NEEDLE  ASPIRATION BIOPSY  01/07/2022   Procedure: BRONCHIAL NEEDLE ASPIRATION BIOPSIES;  Surgeon: Josephine Igo, DO;  Location: [facility] ENDOSCOPY;  Service: Pulmonary;;   CERVICAL SPINE SURGERY  02/03/2013   Dr Shon Baton   FRACTURE SURGERY  Left    tib/fib/femur   TESTICLE REMOVAL Left    TONSILLECTOMY      ROS: Review of Systems Negative except as stated above  PHYSICAL EXAM: BP 138/87   Pulse 76   Temp (!) 97.5 F (36.4 C) (Oral)   Ht 6\' 3"  (1.905 m)   Wt 272 lb 6.4 oz (123.6 kg)   SpO2 93%   BMI 34.05 kg/m   Physical Exam HENT:     Head: Normocephalic and atraumatic.     Nose: Nose normal.     Mouth/Throat:     Mouth: Mucous membranes are moist.     Pharynx: Oropharynx is clear.  Eyes:     Extraocular Movements: Extraocular movements intact.     Conjunctiva/sclera: Conjunctivae normal.     Pupils: Pupils are equal, round, and reactive to light.  Cardiovascular:     Rate and Rhythm: Normal rate and regular rhythm.     Pulses: Normal pulses.     Heart sounds: Normal heart sounds.  Pulmonary:     Effort: Pulmonary effort is normal.     Breath sounds: Normal breath sounds.  Abdominal:     General: Bowel sounds are normal.     Palpations: Abdomen is soft.  Musculoskeletal:     Right shoulder: Normal.     Left shoulder: Normal.     Right upper arm: Normal.     Left upper arm: Normal.     Right elbow: Normal.     Left elbow: Normal.     Right forearm: Normal.     Left forearm: Normal.     Right wrist: Normal.     Left wrist: Normal.     Right hand: Normal.     Left hand: Normal.     Cervical back: Normal, normal range of motion and neck supple.     Thoracic back: Normal.     Lumbar back: Tenderness present.     Right hip: Normal.     Left hip: Normal.     Right upper leg: Normal.     Left upper leg: Normal.     Right knee: Normal.     Left knee: Normal.     Right lower leg: Normal.     Left lower leg: Normal.     Right ankle: Normal.     Left ankle: Normal.      Right foot: Normal.     Left foot: Normal.  Neurological:     General: No focal deficit present.     Mental Status: He is alert and oriented to person, place, and time.  Psychiatric:        Mood and Affect: Mood normal.        Behavior: Behavior normal.    ASSESSMENT AND PLAN: 1. Acute left-sided low back pain without sciatica 2. Left groin pain 3. Radicular pain - Continue present management.  - Referral to Orthopedics for further evaluation/management.  - Follow-up with primary provider in 1 week or sooner if needed. - Ambulatory referral to Orthopedics  4. Pulmonary nodule - Keep all scheduled appointments with Oncology.   5. Kidney stone - Tamsulosin as prescribed. Counseled on medication adherence/adverse effects.  - Referral to Urology for further evaluation/management.  - Follow-up with primary provider in 1 week or sooner if needed.  - Ambulatory referral to Urology - tamsulosin (FLOMAX) 0.4 MG CAPS capsule; Take 1 capsule (0.4 mg total) by mouth daily.  Dispense: 7 capsule; Refill: 0    Patient was  given the opportunity to ask questions.  Patient verbalized understanding of the plan and was able to repeat key elements of the plan. Patient was given clear instructions to go to Emergency Department or return to medical center if symptoms don't improve, worsen, or new problems develop.The patient verbalized understanding.   Orders Placed This Encounter  Procedures   Ambulatory referral to Urology   Ambulatory referral to Orthopedics     Requested Prescriptions   Signed Prescriptions Disp Refills   tamsulosin (FLOMAX) 0.4 MG CAPS capsule 7 capsule 0    Sig: Take 1 capsule (0.4 mg total) by mouth daily.    Return in about 1 week (around 12/17/2022) for Follow-Up or next available [name], MD .  Rema Fendt, NP

## 2022-12-10 ENCOUNTER — Ambulatory Visit (INDEPENDENT_AMBULATORY_CARE_PROVIDER_SITE_OTHER): Payer: Federal, State, Local not specified - PPO | Admitting: Family

## 2022-12-10 ENCOUNTER — Encounter: Payer: Self-pay | Admitting: Family

## 2022-12-10 VITALS — BP 138/87 | HR 76 | Temp 97.5°F | Ht 75.0 in | Wt 272.4 lb

## 2022-12-10 DIAGNOSIS — R911 Solitary pulmonary nodule: Secondary | ICD-10-CM

## 2022-12-10 DIAGNOSIS — M5410 Radiculopathy, site unspecified: Secondary | ICD-10-CM

## 2022-12-10 DIAGNOSIS — R1032 Left lower quadrant pain: Secondary | ICD-10-CM

## 2022-12-10 DIAGNOSIS — M545 Low back pain, unspecified: Secondary | ICD-10-CM | POA: Diagnosis not present

## 2022-12-10 DIAGNOSIS — N200 Calculus of kidney: Secondary | ICD-10-CM

## 2022-12-10 MED ORDER — TAMSULOSIN HCL 0.4 MG PO CAPS
0.4000 mg | ORAL_CAPSULE | Freq: Every day | ORAL | 0 refills | Status: AC
Start: 2022-12-10 — End: ?

## 2022-12-10 NOTE — Progress Notes (Signed)
E.R recommends PT get an MRI  PT wants to try to get a more thorough diagnosis of abdominal pain   PT states he quit taking Venlafaxine XR, due to reactions with Parkinson's meds.

## 2022-12-17 ENCOUNTER — Ambulatory Visit: Payer: Federal, State, Local not specified - PPO | Admitting: Family Medicine

## 2022-12-17 ENCOUNTER — Encounter: Payer: Self-pay | Admitting: Family Medicine

## 2022-12-17 VITALS — BP 134/91 | HR 78 | Resp 16 | Wt 269.0 lb

## 2022-12-17 DIAGNOSIS — K219 Gastro-esophageal reflux disease without esophagitis: Secondary | ICD-10-CM

## 2022-12-17 DIAGNOSIS — K2980 Duodenitis without bleeding: Secondary | ICD-10-CM

## 2022-12-17 DIAGNOSIS — R109 Unspecified abdominal pain: Secondary | ICD-10-CM

## 2022-12-17 NOTE — Progress Notes (Signed)
Established Patient Office Visit  Subjective    Patient ID: Chad Knapp, male    DOB: 16-Mar-1951  Age: 72 y.o. MRN: [id number]  CC:  Chief Complaint  Patient presents with   Follow-up    HPI Chad Knapp presents with complaint of back pain and abdominal pain. Patient has also been seen at ED for sx. Sx for 2-3 weeks. Has had a CT performed.    Outpatient Encounter Medications as of 12/17/2022  Medication Sig   acetaminophen (TYLENOL) 500 MG tablet Take 1,000 mg by mouth every 6 (six) hours as needed for moderate pain.   albuterol (PROVENTIL HFA;VENTOLIN HFA) 108 (90 BASE) MCG/ACT inhaler Inhale 2 puffs into the lungs every 4 (four) hours as needed for wheezing or shortness of breath.   camphor-menthol (SARNA) lotion Apply 1 Application topically as needed for itching.   carbidopa-levodopa (SINEMET IR) 25-100 MG tablet Take 2.5 tablets by mouth 3 (three) times daily.   famotidine (PEPCID) 40 MG tablet TAKE ONE TABLET BY MOUTH DAILY FOR ACID REFLUX - REPLACES PANTOPRAZOLE   fluticasone (FLONASE) 50 MCG/ACT nasal spray Place 2 sprays into the nose daily as needed for rhinitis or allergies.   ibuprofen (ADVIL) 800 MG tablet Take 800 mg by mouth every 8 (eight) hours as needed for moderate pain.   Lactobacillus (ACIDOPHILUS/BIFIDUS PO) Take 2 tablets by mouth daily.   loratadine (ALLERGY) 10 MG tablet Take 10 mg by mouth daily. [employer]   losartan (COZAAR) 100 MG tablet Take 50 mg by mouth daily.   methocarbamol (ROBAXIN) 500 MG tablet Take 500 mg by mouth every 8 (eight) hours as needed for muscle spasms.   methylPREDNISolone (MEDROL DOSEPAK) 4 MG TBPK tablet Regarding symptoms   Multiple Vitamin (MULTIVITAMIN WITH MINERALS) TABS tablet Take 1 tablet by mouth daily.   ofloxacin (OCUFLOX) 0.3 % ophthalmic solution Place 1 drop into the left eye 4 (four) times daily.   Omega-3 Fatty Acids (OMEGA 3 PO) Take 600 mg by mouth daily.   oxyCODONE (ROXICODONE) 5 MG immediate  release tablet Take 1 tablet (5 mg total) by mouth every 6 (six) hours as needed for severe pain.   propranolol (INDERAL) 20 MG tablet Take 20 mg by mouth 3 (three) times daily.   rasagiline (AZILECT) 1 MG TABS tablet TAKE ONE TABLET BY MOUTH ONCE A DAY TO SLOW PARKINSON'S DISEASE - NOT A DOSE CHANGE   tamsulosin (FLOMAX) 0.4 MG CAPS capsule Take 1 capsule (0.4 mg total) by mouth daily.   Tiotropium Bromide-Olodaterol (STIOLTO RESPIMAT) 2.5-2.5 MCG/ACT AERS Inhale 2 puffs into the lungs daily.   Tiotropium Bromide-Olodaterol (STIOLTO RESPIMAT) 2.5-2.5 MCG/ACT AERS Inhale 2 puffs into the lungs daily.   venlafaxine XR (EFFEXOR-XR) 37.5 MG 24 hr capsule Take 37.5 mg by mouth daily.   gabapentin (NEURONTIN) 100 MG capsule Take 200 mg by mouth 3 (three) times daily. (Patient not taking: Reported on 12/10/2022)   No facility-administered encounter medications on file as of 12/17/2022.    Past Medical History:  Diagnosis Date   Anxiety    Arthritis    Cancer (HCC)    basal cell -removed - back, shoulder and chest, squamous cell on nose - removed   COVID 12/2021   no symptoms per pt.   Depression    GERD (gastroesophageal reflux disease)    History of kidney stones    Hx of transfusion of packed red blood cells 06/23/1972   Hypertension    Pneumonia    Shortness  of breath    Sleep apnea    cpap    Past Surgical History:  Procedure Laterality Date   ANTERIOR CERVICAL DECOMP/DISCECTOMY FUSION N/A 02/03/2013   Procedure: ACDF C6 - 7     1 LEVEL;  Surgeon: Venita Lick, MD;  Location: [facility] OR;  Service: Orthopedics;  Laterality: N/A;   APPENDECTOMY     BASAL CELL CARCINOMA EXCISION     BRONCHIAL BIOPSY  01/07/2022   Procedure: BRONCHIAL BIOPSIES;  Surgeon: Josephine Igo, DO;  Location: [facility];  Service: Pulmonary;;   BRONCHIAL NEEDLE ASPIRATION BIOPSY  01/07/2022   Procedure: BRONCHIAL NEEDLE ASPIRATION BIOPSIES;  Surgeon: Josephine Igo, DO;  Location: [facility];  Service:  Pulmonary;;   CERVICAL SPINE SURGERY  02/03/2013   Dr Shon Baton   FRACTURE SURGERY Left    tib/fib/femur   TESTICLE REMOVAL Left    TONSILLECTOMY      Family History  Problem Relation Age of Onset   Mental illness Mother     Social History   Socioeconomic History   Marital status: Married    Spouse name: Not on file   Number of children: Not on file   Years of education: Not on file   Highest education level: Not on file  Occupational History   Not on file  Tobacco Use   Smoking status: Former    Packs/day: 0.50    Years: 36.00    Additional pack years: 0.00    Total pack years: 18.00    Types: Cigarettes    Start date: 85    Quit date: 06/20/2007    Years since quitting: 15.5   Smokeless tobacco: Never  Vaping Use   Vaping Use: Never used  Substance and Sexual Activity   Alcohol use: Yes    Comment: maybe 2 beers a week   Drug use: Not Currently    Types: Marijuana    Comment: 01/06/22 - none for 6 months   Sexual activity: Yes  Other Topics Concern   Not on file  Social History Narrative   Not on file   Social Determinants of Health   Financial Resource Strain: Not on file  Food Insecurity: Not on file  Transportation Needs: Not on file  Physical Activity: Not on file  Stress: Not on file  Social Connections: Not on file  Intimate Partner Violence: Not on file    Review of Systems  All other systems reviewed and are negative.       Objective    BP (!) 134/91   Pulse 78   Resp 16   Wt 269 lb (122 kg)   SpO2 95%   BMI 33.62 kg/m   Physical Exam Vitals and nursing note reviewed.  Constitutional:      General: He is not in acute distress. Cardiovascular:     Rate and Rhythm: Normal rate and regular rhythm.  Pulmonary:     Effort: Pulmonary effort is normal.     Breath sounds: Normal breath sounds.  Abdominal:     Palpations: Abdomen is soft.     Tenderness: There is abdominal tenderness.  Neurological:     General: No focal deficit  present.     Mental Status: He is alert and oriented to person, place, and time.         Assessment & Plan:   1. Abdominal pain, unspecified abdominal location  - Ambulatory referral to Gastroenterology  2. Gastroesophageal reflux disease, unspecified whether esophagitis present  - Ambulatory referral  to Gastroenterology  3. Duodenitis  - Ambulatory referral to Gastroenterology    No follow-ups on file.   Tommie Raymond, MD

## 2022-12-19 ENCOUNTER — Other Ambulatory Visit (INDEPENDENT_AMBULATORY_CARE_PROVIDER_SITE_OTHER): Payer: Federal, State, Local not specified - PPO

## 2022-12-19 ENCOUNTER — Ambulatory Visit (INDEPENDENT_AMBULATORY_CARE_PROVIDER_SITE_OTHER): Payer: Federal, State, Local not specified - PPO | Admitting: Sports Medicine

## 2022-12-19 ENCOUNTER — Encounter: Payer: Self-pay | Admitting: Sports Medicine

## 2022-12-19 ENCOUNTER — Other Ambulatory Visit: Payer: Self-pay

## 2022-12-19 DIAGNOSIS — M2170 Unequal limb length (acquired), unspecified site: Secondary | ICD-10-CM | POA: Diagnosis not present

## 2022-12-19 DIAGNOSIS — Z981 Arthrodesis status: Secondary | ICD-10-CM

## 2022-12-19 DIAGNOSIS — M545 Low back pain, unspecified: Secondary | ICD-10-CM

## 2022-12-19 DIAGNOSIS — M533 Sacrococcygeal disorders, not elsewhere classified: Secondary | ICD-10-CM | POA: Diagnosis not present

## 2022-12-19 DIAGNOSIS — G8929 Other chronic pain: Secondary | ICD-10-CM | POA: Diagnosis not present

## 2022-12-19 DIAGNOSIS — Z8739 Personal history of other diseases of the musculoskeletal system and connective tissue: Secondary | ICD-10-CM | POA: Diagnosis not present

## 2022-12-19 NOTE — Progress Notes (Signed)
Chad Knapp - 72 y.o. male MRN [id number]  Date of birth: [birth year]  Office Visit Note: Visit Date: 12/19/2022 PCP: [name], MD Referred by: Rema Fendt, NP  Subjective: Chief Complaint  Patient presents with   Lower Back - Pain   HPI: Chad Knapp is a pleasant 72 y.o. male who presents today for left-sided low back pain.  Independent note review from ED on 12/06/22 - Was seen in the ED for low back pain and abdominal pain 12/06/2022, notes were reviewed.  Was given prescription for oxycodone and a Medrol Dosepak. Independent chart review from 12/17/2022 with Dr. [name], follow-up for abdominal pain, thought to have diverticulitis. Independent review of Abd-CT scan reviewed - incompletely visualized degenerative changes of lower thoracic and lumbar spine.  Chad Knapp tells me today that after the oxycodone and the Medrol Dosepak his abdominal pain is better, still having some pain over the left side of the low back.  He has a history of cervical fusion as well as a history of a herniated disc of the lumbar spine that he was seen previously for at the [place].  He has a complex history from the back as he had a compound tibia-fibular fracture which left him with a short left leg.  He has custom orthotics and heel lift to help correct his leg length discrepancy.  He has no numbness or tingling shooting down the leg but continues with back pain over the left lower side.  Medical History of hypertension, OSA, non-Hodgkin's lymphoma, back pain   Treatment thus far: Muscle relaxers, Tylenol, Medrol Dosepak, back brace  Pertinent ROS were reviewed with the patient and found to be negative unless otherwise specified above in HPI.   Assessment & Plan: Visit Diagnoses:  1. Chronic left-sided low back pain without sciatica   2. Chronic left SI joint pain   3. History of herniated intervertebral disc   4. Acquired leg length discrepancy   5. History of fusion of cervical spine     Plan: Discussed Britt's left-sided low back and SI joint pain with him today.  Did review previous ED note and previous imaging.  Updated x-ray today does show advanced degenerative change of the lumbar spine.  His exam suggests concomitant left SI joint dysfunction as well.  He has tried multiple oral medications with only mild relief.  Given his diverticulitis diagnosis will avoid NSAIDs.  Through shared decision making elected to proceed with ultrasound-guided left SI joint injection.  We will see the degree of response to this if this largely improves his pain we will continue to treat for SI joint dysfunction.  If he still is having pain over the low back, this is more so likely emanating from the lumbar spine and his degenerative disc disease.  We will get him started into formalized physical therapy for both of these issues.  He will follow-up in about 6 weeks for further evaluation.  May take over-the-counter Tylenol, continue lidocaine patches as needed.   Follow-up: Return in about 6 weeks (around 01/30/2023) for for left low back/SI pain.   Meds & Orders: No orders of the defined types were placed in this encounter.   Orders Placed This Encounter  Procedures   XR Lumbar Spine 2-3 Views   US Guided Needle Placement - No Linked Charges     Procedures: U/S-guided SI-joint injection, left   After discussion of risk/benefits/indications, informed verbal consent was obtained. A timeout was then performed. The patient was positioned  in a prone position on exam room table with a pillow placed under the pelvis for mild hip flexion. The SI joint area was cleaned and prepped with betadine and alcohol swabs. Sterile ultrasound gel was applied and the ultrasound transducer was placed in an anatomic axial plane over the PSIS, then moved distally over the SI-joint. Using ultrasound guidance, a 22-gauge, 3.5" needle was inserted from a medial to lateral approach utilizing an in-plane approach and  directed into the SI-joint. The SI-joint was then injected with a mixture of 4:1 lidocaine:depomedrol with visualization of the injectate flow into the SI-joint under ultrasound visualization. The patient tolerated the procedure well without immediate complications.       Clinical History: No specialty comments available.  He reports that he quit smoking about 15 years ago. His smoking use included cigarettes. He started smoking about 52 years ago. He has a 18.00 pack-year smoking history. He has never used smokeless tobacco. No results for input(s): "HGBA1C", "LABURIC" in the last 8760 hours.  Objective:   Vital Signs: There were no vitals taken for this visit.  Physical Exam  Gen: Well-appearing, in no acute distress; non-toxic CV:  Well-perfused. Warm.  Resp: Breathing unlabored on room air; no wheezing. Psych: Fluid speech in conversation; appropriate affect; normal thought process Neuro: Sensation intact throughout. No gross coordination deficits.   Ortho Exam - Low back: No midline spinous process TTP, has full range flexion and of the spine, although some pulling sensation with flexion and relief with extension.  There is positive TTP over the left SI joint just inferior to the PSIS.  Positive Faber, negative FABER.  Positive Fortin's point test.  Patient walks with a slightly forward flexed positioning of the spine.  Imaging: XR Lumbar Spine 2-3 Views  Result Date: 12/19/2022 2 views of the lumbar spine including AP and lateral femoral ordered and reviewed by myself.  X-rays demonstrate multilevel degenerative disc disease of the lower spine, with no significant change at the L3-L4 level, as well as L2-L3 and L4-L5.  There is facet arthropathy noted at L4-L5.  No acute fracture noted.  There is anterior spurring of the intervertebral disc from L1-L4.   CLINICAL DATA: History of marginal cell lymphoma. Left lower quadrant abdominal pain. * Tracking Code: BO *.  EXAM: CT ABDOMEN  AND PELVIS WITH CONTRAST  TECHNIQUE: Multidetector CT imaging of the abdomen and pelvis was performed using the standard protocol following bolus administration of intravenous contrast.  RADIATION DOSE REDUCTION: This exam was performed according to the departmental dose-optimization program which includes automated exposure control, adjustment of the mA and/or kV according to patient size and/or use of iterative reconstruction technique.  CONTRAST: OMNIPAQUE IOHEXOL 300 MG/ML SOLN  COMPARISON: CT chest 10/29/2022; PET-CT 02/27/2022  FINDINGS: Lower chest: Redemonstrated bandlike consolidation left lower lung. Redemonstrated 8 mm right lower lobe nodule (image 11; series 5). No pleural effusion or pneumothorax.  Hepatobiliary: Liver is normal in size and contour. Stable subcentimeter low-attenuation lesions within the liver, too small to characterize. Gallbladder is unremarkable.  Pancreas: Unremarkable  Spleen: Unremarkable  Adrenals/Urinary Tract: Normal adrenal glands. Kidneys enhance symmetrically with contrast. Stable too small to characterize low-attenuation lesion mid pole left kidney. There is a 1.9 x 1.9 cm soft tissue nodule within the right perinephric fat posteriorly (image 57; series 2). Punctate stone inferior pole right kidney. Urinary bladder is unremarkable.  Stomach/Bowel: Descending and sigmoid colonic diverticulosis. No CT evidence for acute diverticulitis. No evidence for small bowel obstruction. No free  fluid or free intraperitoneal air. Normal morphology of the stomach.  Vascular/Lymphatic: Normal caliber abdominal aorta. Peripheral calcified atherosclerotic plaque. No retroperitoneal lymphadenopathy.  Reproductive: Heterogeneous prostate.  Other: None.  Musculoskeletal: Lumbar spine degenerative changes. No aggressive or acute appearing osseous lesions.  IMPRESSION: 1. There is a 1.9 cm soft tissue nodule within the right  perinephric fat posteriorly. This is nonspecific in etiology. Possibility of lymphoma is not excluded. 2. Redemonstrated bandlike consolidation left lower lung. 3. Redemonstrated 8 mm right lower lobe pulmonary nodule, likely lymphoma, attention on follow-up is recommended. 4. Punctate stone inferior pole right kidney.   Electronically Signed By: Annia Belt M.D. On: 12/06/2022 11:22   Past Medical/Family/Surgical/Social History: Medications & Allergies reviewed per EMR, new medications updated. Patient Active Problem List   Diagnosis Date Noted   Lung nodule 12/20/2021   Right upper lobe pulmonary nodule 09/18/2021   HEMORRHOIDS, INTERNAL 07/15/2007   RHINITIS 07/15/2007   GASTRITIS 07/15/2007   Duodenitis 07/15/2007   DIVERTICULOSIS OF COLON 07/15/2007   ARTHRITIS 07/15/2007   COLONIC POLYPS, HYPERPLASTIC, HX OF 07/15/2007   Past Medical History:  Diagnosis Date   Anxiety    Arthritis    Cancer (HCC)    basal cell -removed - back, shoulder and chest, squamous cell on nose - removed   COVID 12/2021   no symptoms per pt.   Depression    GERD (gastroesophageal reflux disease)    History of kidney stones    Hx of transfusion of packed red blood cells 06/23/1972   Hypertension    Pneumonia    Shortness of breath    Sleep apnea    cpap   Family History  Problem Relation Age of Onset   Mental illness Mother    Past Surgical History:  Procedure Laterality Date   ANTERIOR CERVICAL DECOMP/DISCECTOMY FUSION N/A 02/03/2013   Procedure: ACDF C6 - 7     1 LEVEL;  Surgeon: Venita Lick, MD;  Location: [facility];  Service: Orthopedics;  Laterality: N/A;   APPENDECTOMY     BASAL CELL CARCINOMA EXCISION     BRONCHIAL BIOPSY  01/07/2022   Procedure: BRONCHIAL BIOPSIES;  Surgeon: Josephine Igo, DO;  Location: [facility];  Service: Pulmonary;;   BRONCHIAL NEEDLE ASPIRATION BIOPSY  01/07/2022   Procedure: BRONCHIAL NEEDLE ASPIRATION BIOPSIES;  Surgeon: Josephine Igo, DO;   Location: [facility];  Service: Pulmonary;;   CERVICAL SPINE SURGERY  02/03/2013   Dr Shon Baton   FRACTURE SURGERY Left    tib/fib/femur   TESTICLE REMOVAL Left    TONSILLECTOMY     Social History   Occupational History   Not on file  Tobacco Use   Smoking status: Former    Packs/day: 0.50    Years: 36.00    Additional pack years: 0.00    Total pack years: 18.00    Types: Cigarettes    Start date: 52    Quit date: 06/20/2007    Years since quitting: 15.5   Smokeless tobacco: Never  Vaping Use   Vaping Use: Never used  Substance and Sexual Activity   Alcohol use: Yes    Comment: maybe 2 beers a week   Drug use: Not Currently    Types: Marijuana    Comment: 01/06/22 - none for 6 months   Sexual activity: Yes

## 2022-12-19 NOTE — Progress Notes (Signed)
Lower back pain on left side 3 weeks of pain No injury Has tried muscle relaxers, lidocaine patches, prednisone, oxycodone  Also was having left abdominal pain which was worked up in ED/PCP

## 2022-12-22 ENCOUNTER — Encounter: Payer: Self-pay | Admitting: Family Medicine

## 2022-12-31 NOTE — Therapy (Signed)
OUTPATIENT PHYSICAL THERAPY THORACOLUMBAR EVALUATION   Patient Name: Chad Knapp MRN: [id number] DOB:[birth year], 72 y.o., male Today's Date: 01/01/2023  END OF SESSION:  PT End of Session - 01/01/23 1213     Visit Number 1    Date for PT Re-Evaluation 02/12/23    Authorization Type BCBS    Authorization Time Period 01/01/23 to 02/12/23    PT Start Time 0930    PT Stop Time 1015    PT Time Calculation (min) 45 min    Activity Tolerance Patient tolerated treatment well    Behavior During Therapy [facility] for tasks assessed/performed             Past Medical History:  Diagnosis Date   Anxiety    Arthritis    Cancer (HCC)    basal cell -removed - back, shoulder and chest, squamous cell on nose - removed   COVID 12/2021   no symptoms per pt.   Depression    GERD (gastroesophageal reflux disease)    History of kidney stones    Hx of transfusion of packed red blood cells 06/23/1972   Hypertension    Pneumonia    Shortness of breath    Sleep apnea    cpap   Past Surgical History:  Procedure Laterality Date   ANTERIOR CERVICAL DECOMP/DISCECTOMY FUSION N/A 02/03/2013   Procedure: ACDF C6 - 7     1 LEVEL;  Surgeon: Venita Lick, MD;  Location: [facility] OR;  Service: Orthopedics;  Laterality: N/A;   APPENDECTOMY     BASAL CELL CARCINOMA EXCISION     BRONCHIAL BIOPSY  01/07/2022   Procedure: BRONCHIAL BIOPSIES;  Surgeon: Josephine Igo, DO;  Location: [facility];  Service: Pulmonary;;   BRONCHIAL NEEDLE ASPIRATION BIOPSY  01/07/2022   Procedure: BRONCHIAL NEEDLE ASPIRATION BIOPSIES;  Surgeon: Josephine Igo, DO;  Location: [facility];  Service: Pulmonary;;   CERVICAL SPINE SURGERY  02/03/2013   Dr Shon Baton   FRACTURE SURGERY Left    tib/fib/femur   TESTICLE REMOVAL Left    TONSILLECTOMY     Patient Active Problem List   Diagnosis Date Noted   Lung nodule 12/20/2021   Right upper lobe pulmonary nodule 09/18/2021   HEMORRHOIDS, INTERNAL 07/15/2007   RHINITIS 07/15/2007    GASTRITIS 07/15/2007   Duodenitis 07/15/2007   DIVERTICULOSIS OF COLON 07/15/2007   ARTHRITIS 07/15/2007   COLONIC POLYPS, HYPERPLASTIC, HX OF 07/15/2007    PCP: [name], MD  REFERRING PROVIDER: Madelyn Brunner, DO  REFERRING DIAG: Chronic Lt sided LBP without sciatica; chronic Lt SI joint pain; history of herniated intervertebral disc  Rationale for Evaluation and Treatment: Rehabilitation  THERAPY DIAG:  Other abnormalities of gait and mobility  Chronic bilateral low back pain with right-sided sciatica  Muscle weakness (generalized)  ONSET DATE: November 30 2022  SUBJECTIVE:  SUBJECTIVE STATEMENT: On June 9th, pt woke up and noticed pain in the low back/SI region. He started to take a muscle relaxer and use a heating pad. This helped some, but the following week he noticed additional intense pain in his Lt groin. This caused him to go to the hospital but he wasn't given a specific cause of the pain. His PCP has noted th groin pain could be diverticulitis and he is seeing a specialist for this tomorrow. Pt states that he had SI joint injection which helped for about 3 or 4 days, then his pain returned. It was improved from prior to the injection. He has been wearing a back brace since the onset.  PERTINENT HISTORY:  Kidney stones, arthritis, herniated disc, diagnosed with Parkinson's 2 years ago  PAIN:  Are you having pain? Yes: NPRS scale: 4/10 Pain location: Lt SI region Pain description: stiffness, can be shooting/sharp Aggravating factors: sitting worse than standing; sit down the wrong way or twist/lean a certain way, lifting the Lt leg to put on shoe/sock Relieving factors: avoiding aggravating activities  PRECAUTIONS: None  RED FLAGS: None   WEIGHT BEARING RESTRICTIONS: No  FALLS:   Has patient fallen in last 6 months? No  LIVING ENVIRONMENT: Lives with: lives with their spouse Lives in: House/apartment Stairs: Yes outside deck 3 steps  Has following equipment at home: Single point cane  OCCUPATION: retired  PLOF: Independent  PATIENT GOALS: improve pain with daily activity   NEXT MD VISIT: after PT  OBJECTIVE:   DIAGNOSTIC FINDINGS:    PATIENT SURVEYS:  FOTO 40  SCREENING FOR RED FLAGS: Bowel or bladder incontinence: No Spinal tumors: No Cauda equina syndrome: No Compression fracture: No Abdominal aneurysm: No  COGNITION: Overall cognitive status: Within functional limits for tasks assessed     SENSATION: WFL  MUSCLE LENGTH: Hamstrings: Right WNL deg; Left WNL deg Maisie Fus test: Right  deg; Left 0 deg hip flexor  POSTURE: rounded shoulders and forward head  PALPATION: Tenderness Lt SI joint, L4/L5 paraspinals, Lt proximal glutes  LUMBAR ROM:   AROM eval  Flexion Pain free  Extension Pain free REIS x10 reps no change REIP x10 reps pain decreased to 3/10  Right lateral flexion   Left lateral flexion   Right rotation Pain Lt low back  Left rotation Pain free    (Blank rows = not tested)  LOWER EXTREMITY ROM:       Right eval Left eval  Hip flexion    Hip extension    Hip abduction    Hip adduction    Hip internal rotation    Hip external rotation    Knee flexion    Knee extension    Ankle dorsiflexion    Ankle plantarflexion    Ankle inversion    Ankle eversion     (Blank rows = not tested)  LOWER EXTREMITY MMT:    MMT Right eval Left eval  Hip flexion 5 5  Hip extension 4 3  Hip abduction 4 3  Hip adduction    Hip internal rotation    Hip external rotation    Knee flexion 5 5  Knee extension    Ankle dorsiflexion    Ankle plantarflexion    Ankle inversion    Ankle eversion     (Blank rows = not tested)  LUMBAR SPECIAL TESTS:   SI Compression/distraction test: Negative, FABER test: Negative, and  Gaenslen's test: Negative  FUNCTIONAL TESTS:  5 times sit to stand: 18 sec  GAIT: Distance walked: 20 Assistive device utilized: None  Level of assistance: Complete Independence Comments: decreased stance time on Lt, (+) Lt SI region pain with weightbearing Lt  TODAY'S TREATMENT:                                                                                                                              DATE:  01/01/23 Prone press on elbows x10 reps, no change Prone press up with arms extended x10 reps, pain decreased to 3/10 Prone core activation with hip extension x10 reps each side Supine hip flexor stretch Lt 1x30 sec       PATIENT EDUCATION:  Education details: wean out of brace 20 min 3x/day; HEP Person educated: Patient Education method: Explanation, Verbal cues, and Handouts Education comprehension: verbalized understanding and returned demonstration  HOME EXERCISE PROGRAM: Access Code: [email or web address] URL: [email or web address] Date: 01/01/2023 Prepared by: [facility]  Exercises - Prone Press Up  - 3 x daily - 7 x weekly - 10 reps - Prone Hip Extension  - 1 x daily - 7 x weekly - 3 sets - 10 reps - Hip Flexor Stretch at Edge of Bed  - 2 x daily - 7 x weekly - 30 second hold  ASSESSMENT:  CLINICAL IMPRESSION: Patient is a 72 y.o. M who was seen today for physical therapy evaluation and treatment for Lt low back and SI joint pain of insidious onset about 1 month ago. He also had a bout of Lt groin pain that has improved some, but his PCP has referred to a specialist for diverticulitis. Pt denies N/T. Has some difficulty pinpointing exact movements that aggravate, but notes that pain is worse with sitting than standing. Pt appeared to have a decrease in pain with extension based exercises, but pain was not fully resolved by the end of this session and would benefit from further evaluation of this. He has Lt hip  weakness and evident trunk weakness during bed mobility and poor abdominal pressure management. He would benefit from skilled PT to address his noted limitations and improve activity participation at home and in the community.   OBJECTIVE IMPAIRMENTS: Abnormal gait, decreased activity tolerance, decreased mobility, difficulty walking, decreased strength, increased muscle spasms, impaired flexibility, improper body mechanics, and pain.   ACTIVITY LIMITATIONS: sitting, bed mobility, and locomotion level  PARTICIPATION LIMITATIONS: community activity and yard work  PERSONAL FACTORS: Age, Fitness, Past/current experiences, Time since onset of injury/illness/exacerbation, and 1 comorbidity: history of LBP and herniated disc  are also affecting patient's functional outcome.   REHAB POTENTIAL: Good  CLINICAL DECISION MAKING: Stable/uncomplicated  EVALUATION COMPLEXITY: Moderate   GOALS: Goals reviewed with patient? Yes  SHORT TERM GOALS: Target date: 01/08/23  Pt will be independent with his initial HEP to improve strength and flexibility.  Baseline: Goal status: INITIAL   LONG TERM GOALS: Target date: 02/12/23  Pt will report atleast 60% improvement in his pain  from the start of PT. Baseline:  Goal status: INITIAL  2.  Pt will have improved Lt hip strength to 5/5 MMT. Baseline:  Goal status: INITIAL  3.  Pt will have improved Lt hip extension flexibility and ROM evident by atleast 5 deg improvement of hip extension noted during Breckyn test.  Baseline:  Goal status: INITIAL  4.  Pt will be independent with a maintenance exercise program for land/pool that he can complete after discharge from PT.  Baseline:  Goal status: INITIAL  5.  Pt have pain free lumbar AROM. Baseline:  Goal status: INITIAL    PLAN:  PT FREQUENCY: 2x/week  PT DURATION: 6 weeks  PLANNED INTERVENTIONS: Therapeutic exercises, Therapeutic activity, Neuromuscular re-education, Balance training, Gait  training, Patient/Family education, Self Care, Joint mobilization, Aquatic Therapy, Dry Needling, Cryotherapy, Moist heat, Taping, Manual therapy, and Re-evaluation.  PLAN FOR NEXT SESSION: f/u on repeated extensions at home and progress if needed; consider DN Lt lumbar/glutes; trunk and Lt>Rt hip strength progressions   12:21 PM,01/01/23 Donita Brooks PT, DPT [facility] [place]  [phone]

## 2023-01-01 ENCOUNTER — Encounter: Payer: Self-pay | Admitting: Physical Therapy

## 2023-01-01 ENCOUNTER — Ambulatory Visit: Payer: Federal, State, Local not specified - PPO | Admitting: Physical Therapy

## 2023-01-01 ENCOUNTER — Other Ambulatory Visit: Payer: Self-pay

## 2023-01-01 DIAGNOSIS — M6281 Muscle weakness (generalized): Secondary | ICD-10-CM

## 2023-01-01 DIAGNOSIS — G8929 Other chronic pain: Secondary | ICD-10-CM

## 2023-01-01 DIAGNOSIS — R2689 Other abnormalities of gait and mobility: Secondary | ICD-10-CM

## 2023-01-01 DIAGNOSIS — M5441 Lumbago with sciatica, right side: Secondary | ICD-10-CM | POA: Diagnosis not present

## 2023-01-02 ENCOUNTER — Encounter: Payer: Self-pay | Admitting: Nurse Practitioner

## 2023-01-02 ENCOUNTER — Other Ambulatory Visit (INDEPENDENT_AMBULATORY_CARE_PROVIDER_SITE_OTHER): Payer: Federal, State, Local not specified - PPO

## 2023-01-02 ENCOUNTER — Ambulatory Visit: Payer: Federal, State, Local not specified - PPO | Admitting: Nurse Practitioner

## 2023-01-02 VITALS — BP 110/70 | HR 68 | Ht 73.0 in | Wt 271.1 lb

## 2023-01-02 DIAGNOSIS — R1032 Left lower quadrant pain: Secondary | ICD-10-CM | POA: Diagnosis not present

## 2023-01-02 LAB — SEDIMENTATION RATE: Sed Rate: 16 mm/hr (ref 0–20)

## 2023-01-02 LAB — CBC WITH DIFFERENTIAL/PLATELET
Basophils Absolute: 0.1 10*3/uL (ref 0.0–0.1)
Basophils Relative: 0.9 % (ref 0.0–3.0)
Eosinophils Absolute: 0.3 10*3/uL (ref 0.0–0.7)
Eosinophils Relative: 2.7 % (ref 0.0–5.0)
HCT: 47.2 % (ref 39.0–52.0)
Hemoglobin: 16 g/dL (ref 13.0–17.0)
Lymphocytes Relative: 19.5 % (ref 12.0–46.0)
Lymphs Abs: 1.9 10*3/uL (ref 0.7–4.0)
MCHC: 33.8 g/dL (ref 30.0–36.0)
MCV: 90.9 fl (ref 78.0–100.0)
Monocytes Absolute: 0.9 10*3/uL (ref 0.1–1.0)
Monocytes Relative: 9 % (ref 3.0–12.0)
Neutro Abs: 6.8 10*3/uL (ref 1.4–7.7)
Neutrophils Relative %: 67.9 % (ref 43.0–77.0)
Platelets: 183 10*3/uL (ref 150.0–400.0)
RBC: 5.19 Mil/uL (ref 4.22–5.81)
RDW: 13.7 % (ref 11.5–15.5)
WBC: 10 10*3/uL (ref 4.0–10.5)

## 2023-01-02 LAB — COMPREHENSIVE METABOLIC PANEL
ALT: 3 U/L (ref 0–53)
AST: 12 U/L (ref 0–37)
Albumin: 4.1 g/dL (ref 3.5–5.2)
Alkaline Phosphatase: 69 U/L (ref 39–117)
BUN: 15 mg/dL (ref 6–23)
CO2: 28 mEq/L (ref 19–32)
Calcium: 9.6 mg/dL (ref 8.4–10.5)
Chloride: 102 mEq/L (ref 96–112)
Creatinine, Ser: 0.92 mg/dL (ref 0.40–1.50)
GFR: 82.88 mL/min (ref >60.00)
Glucose, Bld: 87 mg/dL (ref 70–99)
Potassium: 4.1 mEq/L (ref 3.5–5.1)
Sodium: 137 mEq/L (ref 135–145)
Total Bilirubin: 0.6 mg/dL (ref 0.2–1.2)
Total Protein: 7.6 g/dL (ref 6.0–8.3)

## 2023-01-02 LAB — C-REACTIVE PROTEIN: CRP: <1 mg/dL (ref 0.5–20.0)

## 2023-01-02 NOTE — Progress Notes (Unsigned)
01/03/2023 AEDDON MERRYFIELD [id number] 02-10-1951   CHIEF COMPLAINT: LLQ pain   HISTORY OF PRESENT ILLNESS: Maisie Fus B. Elena is a 72 year old male with a past medical history of anxiety, depression, hypertension, kidney stones, Non-Hodgkin's Lymphoma 05/2022, sleep apnea, right lung nodule, pneumonia, Parkinson's disease, GERD and colon polyps. Remotely known by Dr. Russella Dar 2007 - 2008. He presents today for further evaluation regarding LLQ pain. He awakened on 11/30/2022 with lowe back pain and 3 to 4 days later he developed LLQ pain "doubled over, felt like a kidney stone". He saw his PCP who initially suspected his LLQ pain was triggered by his back pain. His LLQ pain worsened therefore he went to the ED 12/06/2022 and CTAP which showed diverticulosis to the descending and sigmoid colon without evidence of diverticulitis and a 1.9 x 1.9 cm soft tissue nodule within the right perinephric fat was noted. CBC and CMP were normal. He was prescribed a Medrol dose pak and Oxycodone and he was discharged home.  He stated his LLQ pain is much less at this time, rates 3 or 4 on a scale of 1 to 10 but continues to have a mild ache to this area which is somewhat more noticeable when he drives over a bump in the road. He endorses having constipation x 6 months for which he takes Dulcolax 1 tabs if no BM in 2 days. He sometimes sits on the commode for one hour. No blood stools. The onset of constipation correlated to the time he started Sinemet for Parkinson's disease. He takes Famotidine daily which controls his GERD. He reported his most recent colonoscopy done by GI at the [place] in [place] was 06/2017 which was "clean", no polyps and a repeat colonoscopy in 7 years was recommended. Note, [facility] is not accessible in care everywhere at this time. Colonoscopy in 2013 identified one tubular adenomatous polyp removed from the colon.      Latest Ref Rng & Units 01/02/2023   12:18 PM 12/06/2022    9:38  AM 08/20/2022   12:03 PM  CBC  WBC 4.0 - 10.5 K/uL 10.0  6.8  8.6   Hemoglobin 13.0 - 17.0 g/dL 40.9  81.1  91.4   Hematocrit 39.0 - 52.0 % 47.2  43.7  44.1   Platelets 150.0 - 400.0 K/uL 183.0  168  159        Latest Ref Rng & Units 01/02/2023   12:18 PM 12/06/2022    9:38 AM 08/20/2022   12:03 PM  CMP  Glucose 70 - 99 mg/dL 87  98  86   BUN 6 - 23 mg/dL 15  12  15    Creatinine 0.40 - 1.50 mg/dL 7.82  9.56  2.13   Sodium 135 - 145 mEq/L 137  136  137   Potassium 3.5 - 5.1 mEq/L 4.1  3.7  4.3   Chloride 96 - 112 mEq/L 102  106  105   CO2 19 - 32 mEq/L 28  22  28    Calcium 8.4 - 10.5 mg/dL 9.6  8.9  8.9   Total Protein 6.0 - 8.3 g/dL 7.6  7.4  6.6   Total Bilirubin 0.2 - 1.2 mg/dL 0.6  0.9  0.4   Alkaline Phos 39 - 117 U/L 69  58  69   AST 0 - 37 U/L 12  17  15    ALT 0 - 53 U/L 3  21  5      CTAP  with contrast 12/06/2022:  FINDINGS: Lower chest: Redemonstrated bandlike consolidation left lower lung. Redemonstrated 8 mm right lower lobe nodule (image 11; series 5). No pleural effusion or pneumothorax.   Hepatobiliary: Liver is normal in size and contour. Stable subcentimeter low-attenuation lesions within the liver, too small to characterize. Gallbladder is unremarkable.   Pancreas: Unremarkable   Spleen: Unremarkable   Adrenals/Urinary Tract: Normal adrenal glands. Kidneys enhance symmetrically with contrast. Stable too small to characterize low-attenuation lesion mid pole left kidney. There is a 1.9 x 1.9 cm soft tissue nodule within the right perinephric fat posteriorly (image 57; series 2). Punctate stone inferior pole right kidney. Urinary bladder is unremarkable.   Stomach/Bowel: Descending and sigmoid colonic diverticulosis. No CT evidence for acute diverticulitis. No evidence for small bowel obstruction. No free fluid or free intraperitoneal air. Normal morphology of the stomach.   Vascular/Lymphatic: Normal caliber abdominal aorta. Peripheral calcified  atherosclerotic plaque. No retroperitoneal lymphadenopathy.   Reproductive: Heterogeneous prostate.   Other: None.   Musculoskeletal: Lumbar spine degenerative changes. No aggressive or acute appearing osseous lesions.   IMPRESSION: 1. There is a 1.9 cm soft tissue nodule within the right perinephric fat posteriorly. This is nonspecific in etiology. Possibility of lymphoma is not excluded. 2. Redemonstrated bandlike consolidation left lower lung. 3. Redemonstrated 8 mm right lower lobe pulmonary nodule, likely lymphoma, attention on follow-up is recommended. 4. Punctate stone inferior pole right kidney.  PAST GI PROCEDURES:  EGD 06/01/2007 by Dr. Russella Dar: -Gastritis  -Duodenitis   Colonoscopy 02/19/2006:  Multiple polyps measuring 3 - 5mm removed throughout the colon COLON, ASCENDING, TRANSVERSE, SIGMOID, AND RECTAL POLYPS:   MULTIPLE HYPERPLASTIC POLYPS. NO ADENOMATOUS CHANGE OR   MALIGNANCY IDENTIFIED.   Colonoscopy 04/21/2012 [facility]:  1.  Moderate diverticulosis in the sigmoid colon  2.  6-7 mm polyp in the sigmoid colon; Therapies performed: A polypectomy was  performed using a cold snare, the sample was sent to pathology for analysis.  3.  Retroflexed views revealed no abnormalities COLON, POLYP AT 40 CM, BIOPSY:       Tubular adenoma.   06/2017 Colonoscopy done at the [facility] in [place]:  Patient reported was normal, non polyps.   Past Medical History:  Diagnosis Date   Anxiety    Appendicitis    Arthritis    Cancer (HCC)    basal cell -removed - back, shoulder and chest, squamous cell on nose - removed   COVID 12/2021   no symptoms per pt.   Depression    GERD (gastroesophageal reflux disease)    History of kidney stones    Hx of transfusion of packed red blood cells 06/23/1972   Hypertension    Non Hodgkin's lymphoma (HCC)    Pneumonia    Shortness of breath    Sleep apnea    cpap   Past Surgical History:  Procedure Laterality Date    ANTERIOR CERVICAL DECOMP/DISCECTOMY FUSION N/A 02/03/2013   Procedure: ACDF C6 - 7     1 LEVEL;  Surgeon: Venita Lick, MD;  Location: [facility];  Service: Orthopedics;  Laterality: N/A;   APPENDECTOMY     BASAL CELL CARCINOMA EXCISION     BRONCHIAL BIOPSY  01/07/2022   Procedure: BRONCHIAL BIOPSIES;  Surgeon: Josephine Igo, DO;  Location: [facility];  Service: Pulmonary;;   BRONCHIAL NEEDLE ASPIRATION BIOPSY  01/07/2022   Procedure: BRONCHIAL NEEDLE ASPIRATION BIOPSIES;  Surgeon: Josephine Igo, DO;  Location: [facility];  Service: Pulmonary;;   CERVICAL FUSION  02/03/2013   Dr Shon Baton   FRACTURE SURGERY Left    tib/fib/femur   TESTICLE REMOVAL Left    TONSILLECTOMY     Social History: Retired Hotel manager. Married. He has one son and one daughter. He quit smoking cigarettes 15 years ago. No alcohol use. No drug use.   Family History: family history includes Dementia in his mother; Melanoma in his brother; Mental illness in his mother; Other in his father. No known family history of colorectal cancer.  Allergies  Allergen Reactions   Lisinopril Other (See Comments)      Outpatient Encounter Medications as of 01/02/2023  Medication Sig   acetaminophen (TYLENOL) 500 MG tablet Take 1,000 mg by mouth every 6 (six) hours as needed for moderate pain.   albuterol (PROVENTIL HFA;VENTOLIN HFA) 108 (90 BASE) MCG/ACT inhaler Inhale 2 puffs into the lungs every 4 (four) hours as needed for wheezing or shortness of breath.   camphor-menthol (SARNA) lotion Apply 1 Application topically as needed for itching.   carbidopa-levodopa (SINEMET IR) 25-100 MG tablet Take 2.5 tablets by mouth 3 (three) times daily.   famotidine (PEPCID) 40 MG tablet TAKE ONE TABLET BY MOUTH DAILY FOR ACID REFLUX - REPLACES PANTOPRAZOLE   fluticasone (FLONASE) 50 MCG/ACT nasal spray Place 2 sprays into the nose daily as needed for rhinitis or allergies.   ibuprofen (ADVIL) 800 MG tablet Take 800 mg by mouth every 8  (eight) hours as needed for moderate pain.   Lactobacillus (ACIDOPHILUS/BIFIDUS PO) Take 2 tablets by mouth daily.   loratadine (ALLERGY) 10 MG tablet Take 10 mg by mouth daily. [employer] brand   losartan (COZAAR) 100 MG tablet Take 50 mg by mouth daily.   methocarbamol (ROBAXIN) 500 MG tablet Take 500 mg by mouth every 8 (eight) hours as needed for muscle spasms.   Multiple Vitamin (MULTIVITAMIN WITH MINERALS) TABS tablet Take 1 tablet by mouth daily.   ofloxacin (OCUFLOX) 0.3 % ophthalmic solution Place 1 drop into the left eye 4 (four) times daily.   Omega-3 Fatty Acids (OMEGA 3 PO) Take 600 mg by mouth daily.   propranolol (INDERAL) 20 MG tablet Take 20 mg by mouth 3 (three) times daily.   rasagiline (AZILECT) 1 MG TABS tablet TAKE ONE TABLET BY MOUTH ONCE A DAY TO SLOW PARKINSON'S DISEASE - NOT A DOSE CHANGE   tamsulosin (FLOMAX) 0.4 MG CAPS capsule Take 1 capsule (0.4 mg total) by mouth daily.   Tiotropium Bromide-Olodaterol (STIOLTO RESPIMAT) 2.5-2.5 MCG/ACT AERS Inhale 2 puffs into the lungs daily.   Tiotropium Bromide-Olodaterol (STIOLTO RESPIMAT) 2.5-2.5 MCG/ACT AERS Inhale 2 puffs into the lungs daily.   venlafaxine XR (EFFEXOR-XR) 37.5 MG 24 hr capsule Take 37.5 mg by mouth daily.   gabapentin (NEURONTIN) 100 MG capsule Take 200 mg by mouth 3 (three) times daily. (Patient not taking: Reported on 12/10/2022)   oxyCODONE (ROXICODONE) 5 MG immediate release tablet Take 1 tablet (5 mg total) by mouth every 6 (six) hours as needed for severe pain. (Patient not taking: Reported on 01/02/2023)   [DISCONTINUED] methylPREDNISolone (MEDROL DOSEPAK) 4 MG TBPK tablet Regarding symptoms   No facility-administered encounter medications on file as of 01/02/2023.   REVIEW OF SYSTEMS:  Gen: + Fatigue. Denies fever, sweats or chills. No weight loss.  CV: Denies chest pain, palpitations or edema. Resp: Denies cough, shortness of breath of hemoptysis.  GI: Denies heartburn, dysphagia, stomach or lower  abdominal pain. No diarrhea or constipation.  GU: + Urine leakage.  MS: + Arthritis and  back pain.  Derm: Denies rash, itchiness, skin lesions or unhealing ulcers. Psych: + Anxiety.  Heme: Denies bruising, easy bleeding. Neuro:  Denies headaches, dizziness or paresthesias. Endo:  Denies any problems with DM, thyroid or adrenal function.  PHYSICAL EXAM: BP 110/70 (BP Location: Left Arm, Patient Position: Sitting, Cuff Size: Normal)   Pulse 68   Ht 6\' 1"  (1.854 m) Comment: height measured without shoes  Wt 271 lb 2 oz (123 kg)   BMI 35.77 kg/m  General: 72 year old male wearing a back support in NAD. Head: Normocephalic and atraumatic. Eyes:  Sclerae non-icteric, conjunctive pink. Ears: Normal auditory acuity. Mouth: Dentition intact. No ulcers or lesions.  Neck: Supple, no lymphadenopathy or thyromegaly.  Lungs: Clear bilaterally to auscultation without wheezes, crackles or rhonchi. Heart: Regular rate and rhythm. No No rub or gallop appreciated.  Abdomen: Soft, nontender. Mild LLQ tenderness without rebound or guarding. No masses. No hepatosplenomegaly. Normoactive bowel sounds x 4 quadrants.  Rectal: Deferred.  Musculoskeletal: Symmetrical with no gross deformities. Skin: Warm and dry. No rash or lesions on visible extremities. Extremities: No edema. Neurological: Alert oriented x 4, no focal deficits.  Psychological:  Alert and cooperative. Normal mood and affect.  ASSESSMENT AND PLAN:  72 year old male with a history of diverticulosis and constipation x 6 months after starting Sinemet for Parkinson's disease who presents with  LLQ pain, suspect secondary to constipation and LBP may also be a contributing factor. CTAP 12/06/2022 without evidence of diverticulitis. LLQ pain has diminished over the past 3 1/2 weeks but has not abated.  -Take Miralax 1 capful mixed in 8 ounces of water at bed time for constipation as tolerated. -May take Dulcolax one tab Q 2 to 3 nights as  tolerated, stop if LLQ pain worsens  -Ibgard one po bid PRN abdominal pain, samples provided  -[facility] [place]  -CBC, CMP, sed rate and CRP -Repeat CTAP if LLQ pain worsens   History of hyperplastic and tubular adenomatous colon polyps. Patient reported last colonoscopy 06/2017 was normal, no polyps.  -Request 06/2017 colonoscopy records as noted above  1.9 cm soft tissue nodule within the right perinephric fat posteriorly per CTAP 11/2022 -Follow up with PCP  8mm right lower lung nodule per CT -Follow up with PCP  Parkinson's disease on Sinemet  History of Non-Hodgkin's lymphoma     CC:  [name], MD

## 2023-01-02 NOTE — Patient Instructions (Addendum)
Miralax- every night as needed  IbGuard- 1 by mouth twice daily for abdominal pain  Go Your provider has requested that you go to the basement level for lab work before leaving today. Press "B" on the elevator. The lab is located at the first door on the left as you exit the elevator.  Due to recent changes in healthcare laws, you may see the results of your imaging and laboratory studies on MyChart before your provider has had a chance to review them.  We understand that in some cases there may be results that are confusing or concerning to you. Not all laboratory results come back in the same time frame and the provider may be waiting for multiple results in order to interpret others.  Please give [place] 48 hours in order for your provider to thoroughly review all the results before contacting the office for clarification of your results.   Thank you for trusting me with your gastrointestinal care!   Alcide Evener, CRNP

## 2023-01-07 ENCOUNTER — Ambulatory Visit (INDEPENDENT_AMBULATORY_CARE_PROVIDER_SITE_OTHER): Payer: Federal, State, Local not specified - PPO | Admitting: Rehabilitative and Restorative Service Providers"

## 2023-01-07 ENCOUNTER — Encounter: Payer: Self-pay | Admitting: Rehabilitative and Restorative Service Providers"

## 2023-01-07 DIAGNOSIS — R2689 Other abnormalities of gait and mobility: Secondary | ICD-10-CM

## 2023-01-07 DIAGNOSIS — M5441 Lumbago with sciatica, right side: Secondary | ICD-10-CM | POA: Diagnosis not present

## 2023-01-07 DIAGNOSIS — M6281 Muscle weakness (generalized): Secondary | ICD-10-CM

## 2023-01-07 DIAGNOSIS — G8929 Other chronic pain: Secondary | ICD-10-CM

## 2023-01-07 NOTE — Therapy (Signed)
OUTPATIENT PHYSICAL THERAPY TREATMENT   Patient Name: Chad Knapp MRN: [id number] DOB:06-Mar-1951, 72 y.o., male Today's Date: 01/07/2023  END OF SESSION:  PT End of Session - 01/07/23 1138     Visit Number 2    Number of Visits 12    Date for PT Re-Evaluation 02/12/23    Authorization Type BCBS    Authorization Time Period 01/01/23 to 02/12/23    Progress Note Due on Visit 10    PT Start Time 1137    PT Stop Time 1216    PT Time Calculation (min) 39 min    Activity Tolerance Patient tolerated treatment well    Behavior During Therapy [facility] for tasks assessed/performed              Past Medical History:  Diagnosis Date   Anxiety    Appendicitis    Arthritis    Cancer (HCC)    basal cell -removed - back, shoulder and chest, squamous cell on nose - removed   COVID 12/2021   no symptoms per pt.   Depression    GERD (gastroesophageal reflux disease)    History of kidney stones    Hx of transfusion of packed red blood cells 06/23/1972   Hypertension    Non Hodgkin's lymphoma (HCC)    Pneumonia    Shortness of breath    Sleep apnea    cpap   Past Surgical History:  Procedure Laterality Date   ANTERIOR CERVICAL DECOMP/DISCECTOMY FUSION N/A 02/03/2013   Procedure: ACDF C6 - 7     1 LEVEL;  Surgeon: Venita Lick, MD;  Location: [facility] OR;  Service: Orthopedics;  Laterality: N/A;   APPENDECTOMY     BASAL CELL CARCINOMA EXCISION     BRONCHIAL BIOPSY  01/07/2022   Procedure: BRONCHIAL BIOPSIES;  Surgeon: Josephine Igo, DO;  Location: [facility] ENDOSCOPY;  Service: Pulmonary;;   BRONCHIAL NEEDLE ASPIRATION BIOPSY  01/07/2022   Procedure: BRONCHIAL NEEDLE ASPIRATION BIOPSIES;  Surgeon: Josephine Igo, DO;  Location: [facility];  Service: Pulmonary;;   CERVICAL FUSION  02/03/2013   Dr Shon Baton   FRACTURE SURGERY Left    tib/fib/femur   TESTICLE REMOVAL Left    TONSILLECTOMY     Patient Active Problem List   Diagnosis Date Noted   Lung nodule 12/20/2021   Right upper  lobe pulmonary nodule 09/18/2021   HEMORRHOIDS, INTERNAL 07/15/2007   RHINITIS 07/15/2007   GASTRITIS 07/15/2007   Duodenitis 07/15/2007   DIVERTICULOSIS OF COLON 07/15/2007   ARTHRITIS 07/15/2007   COLONIC POLYPS, HYPERPLASTIC, HX OF 07/15/2007    PCP: [name], MD  REFERRING PROVIDER: Madelyn Brunner, DO  REFERRING DIAG: Chronic Lt sided LBP without sciatica; chronic Lt SI joint pain; history of herniated intervertebral disc  Rationale for Evaluation and Treatment: Rehabilitation  THERAPY DIAG:  Other abnormalities of gait and mobility  Chronic bilateral low back pain with right-sided sciatica  Muscle weakness (generalized)  ONSET DATE: November 30 2022  SUBJECTIVE:  SUBJECTIVE STATEMENT: Pt indicated generally feeling better than before at times.  He indicated having some pain related to abdomen at times with exercises which reduced his quantity of exercises.   He indicated using the back brace less and not wearing it.   PERTINENT HISTORY:  Kidney stones, arthritis, herniated disc, diagnosed with Parkinson's 2 years ago  PAIN:   NPRS scale: at worst 4.5-5.5 at worst Pain location: Lt SI region Pain description: stiffness, can be shooting/sharp Aggravating factors: sitting worse than standing; sit down the wrong way or twist/lean a certain way, lifting the Lt leg to put on shoe/sock Relieving factors: avoiding aggravating activities  PRECAUTIONS: None  RED FLAGS: None   WEIGHT BEARING RESTRICTIONS: No  FALLS:  Has patient fallen in last 6 months? No  LIVING ENVIRONMENT: Lives with: lives with their spouse Lives in: House/apartment Stairs: Yes outside deck 3 steps  Has following equipment at home: Single point cane  OCCUPATION: retired  PLOF: Independent  PATIENT GOALS:  improve pain with daily activity   OBJECTIVE:   PATIENT SURVEYS:  01/01/23 FOTO 40  SCREENING FOR RED FLAGS: 01/01/23 Bowel or bladder incontinence: No Spinal tumors: No Cauda equina syndrome: No Compression fracture: No Abdominal aneurysm: No  COGNITION: 01/01/23 Overall cognitive status: Within functional limits for tasks assessed     SENSATION: 01/01/23 [facility]  MUSCLE LENGTH: 01/01/23 Hamstrings: Right WNL deg; Left WNL deg Maisie Fus test: Right  deg; Left 0 deg hip flexor  POSTURE: 01/01/23 rounded shoulders and forward head  PALPATION: 01/01/23 Tenderness Lt SI joint, L4/L5 paraspinals, Lt proximal glutes  LUMBAR ROM:   AROM 01/01/23 01/07/2023  Flexion Pain free Mid shin with tightness in legs   Extension Pain free REIS x10 reps no change REIP x10 reps pain decreased to 3/10 100 % WFL without symptoms  Right lateral flexion  Mild pull Lt lumbar, to mid thigh  Left lateral flexion  To lateral knee jt no complaint  Right rotation Pain Lt low back   Left rotation Pain free     (Blank rows = not tested)  LOWER EXTREMITY ROM:       Right eval Left eval  Hip flexion    Hip extension    Hip abduction    Hip adduction    Hip internal rotation    Hip external rotation    Knee flexion    Knee extension    Ankle dorsiflexion    Ankle plantarflexion    Ankle inversion    Ankle eversion     (Blank rows = not tested)  LOWER EXTREMITY MMT:    MMT Right 01/01/23 Left 01/01/23  Hip flexion 5 5  Hip extension 4 3  Hip abduction 4 3  Hip adduction    Hip internal rotation    Hip external rotation    Knee flexion 5 5  Knee extension    Ankle dorsiflexion    Ankle plantarflexion    Ankle inversion    Ankle eversion     (Blank rows = not tested)  LUMBAR SPECIAL TESTS: 01/01/23   SI Compression/distraction test: Negative, FABER test: Negative, and Gaenslen's test: Negative  FUNCTIONAL TESTS:  01/01/23 5 times sit to stand: 18 sec  GAIT: 01/01/23 Distance  walked: 20 Assistive device utilized: None  Level of assistance: Complete Independence Comments: decreased stance time on Lt, (+) Lt SI region pain with weightbearing Lt  TODAY'S TREATMENT:                       DATE: 01/07/2023 Therex: Lumbar extension standing AROM x 5 Supine lumbar trunk rotation 15 sec x 5 bilateral Supine bridge 2-3 sec hold 2 x 10 Supine pball press down 5 sec hold x 15 submax with UE Nustep lvl 5 10 mins UE/LE c cues for aerobic exercise routine into gym activity on own.   Adjusted prone activity to avoid abdomen related symptoms.  Education and review of new handout with exercise routine.   TODAY'S TREATMENT:                       DATE: 01/01/23 Prone press on elbows x10 reps, no change Prone press up with arms extended x10 reps, pain decreased to 3/10 Prone core activation with hip extension x10 reps each side Supine hip flexor stretch Lt 1x30 sec       PATIENT EDUCATION:  01/07/2023 Education details: HEP update Person educated: Patient Education method: Explanation, Verbal cues, and Handouts Education comprehension: verbalized understanding and returned demonstration  HOME EXERCISE PROGRAM: Access Code: [email or web address] URL: [email or web address] Date: 01/07/2023 Prepared by: Chyrel Masson  Exercises - Standing Lumbar Extension with Counter  - 3-5 x daily - 7 x weekly - 1 sets - 5-10 reps - Supine Lower Trunk Rotation  - 2-3 x daily - 7 x weekly - 1 sets - 3-5 reps - 15 hold - Supine Bridge  - 1-2 x daily - 7 x weekly - 1-2 sets - 10 reps - 2 hold - Hip Flexor Stretch at Edge of Bed  - 2 x daily - 7 x weekly - 30 second hold  ASSESSMENT:  CLINICAL IMPRESSION: Generally good lumbar mobility with minimal symptoms noted in recheck today.  Adjusted HEP to address complaints of secondary abdomen symptoms with good results and updated HEP handout provided today with good knowledge in clinic of activity.  Recommend continued  skilled PT services at this time.   OBJECTIVE IMPAIRMENTS: Abnormal gait, decreased activity tolerance, decreased mobility, difficulty walking, decreased strength, increased muscle spasms, impaired flexibility, improper body mechanics, and pain.   ACTIVITY LIMITATIONS: sitting, bed mobility, and locomotion level  PARTICIPATION LIMITATIONS: community activity and yard work  PERSONAL FACTORS: Age, Fitness, Past/current experiences, Time since onset of injury/illness/exacerbation, and 1 comorbidity: history of LBP and herniated disc  are also affecting patient's functional outcome.   REHAB POTENTIAL: Good  CLINICAL DECISION MAKING: Stable/uncomplicated  EVALUATION COMPLEXITY: Moderate   GOALS: Goals reviewed with patient? Yes  SHORT TERM GOALS: Target date: 01/08/23  Pt will be independent with his initial HEP to improve strength and flexibility.  Baseline: Goal status: Met 01/07/2023   LONG TERM GOALS: Target date: 02/12/23  Pt will report atleast 60% improvement in his pain from the start of PT. Baseline:  Goal status: INITIAL  2.  Pt will have improved Lt hip strength to 5/5 MMT. Baseline:  Goal status: INITIAL  3.  Pt will have improved Lt hip extension flexibility and ROM evident by atleast 5 deg improvement of hip extension noted during Jordyn test.  Baseline:  Goal status: INITIAL  4.  Pt will be independent with a maintenance exercise program for land/pool that he can complete after discharge from PT.  Baseline:  Goal status: INITIAL  5.  Pt have pain free lumbar AROM. Baseline:  Goal status: INITIAL    PLAN:  PT FREQUENCY: 2x/week  PT  DURATION: 6 weeks  PLANNED INTERVENTIONS: Therapeutic exercises, Therapeutic activity, Neuromuscular re-education, Balance training, Gait training, Patient/Family education, Self Care, Joint mobilization, Aquatic Therapy, Dry Needling, Cryotherapy, Moist heat, Taping, Manual therapy, and Re-evaluation.  PLAN FOR NEXT  SESSION:  Continued exercise focus for HEP transitioning as symptoms improve.    Chyrel Masson, PT, DPT, OCS, ATC 01/07/23  12:14 PM

## 2023-01-13 ENCOUNTER — Encounter: Payer: Self-pay | Admitting: Rehabilitative and Restorative Service Providers"

## 2023-01-13 ENCOUNTER — Ambulatory Visit (INDEPENDENT_AMBULATORY_CARE_PROVIDER_SITE_OTHER): Payer: Federal, State, Local not specified - PPO | Admitting: Rehabilitative and Restorative Service Providers"

## 2023-01-13 DIAGNOSIS — R2689 Other abnormalities of gait and mobility: Secondary | ICD-10-CM

## 2023-01-13 DIAGNOSIS — G8929 Other chronic pain: Secondary | ICD-10-CM | POA: Diagnosis not present

## 2023-01-13 DIAGNOSIS — M5441 Lumbago with sciatica, right side: Secondary | ICD-10-CM

## 2023-01-13 DIAGNOSIS — M6281 Muscle weakness (generalized): Secondary | ICD-10-CM

## 2023-01-13 NOTE — Therapy (Signed)
OUTPATIENT PHYSICAL THERAPY TREATMENT   Patient Name: Chad Knapp MRN: [id number] DOB:December 24, 1950, 72 y.o., male Today's Date: 01/13/2023  END OF SESSION:  PT End of Session - 01/13/23 1051     Visit Number 3    Number of Visits 12    Date for PT Re-Evaluation 02/12/23    Authorization Type BCBS    Authorization Time Period 01/01/23 to 02/12/23    Progress Note Due on Visit 10    PT Start Time 1053    PT Stop Time 1132    PT Time Calculation (min) 39 min    Activity Tolerance Patient tolerated treatment well    Behavior During Therapy [facility] for tasks assessed/performed               Past Medical History:  Diagnosis Date   Anxiety    Appendicitis    Arthritis    Cancer (HCC)    basal cell -removed - back, shoulder and chest, squamous cell on nose - removed   COVID 12/2021   no symptoms per pt.   Depression    GERD (gastroesophageal reflux disease)    History of kidney stones    Hx of transfusion of packed red blood cells 06/23/1972   Hypertension    Non Hodgkin's lymphoma (HCC)    Pneumonia    Shortness of breath    Sleep apnea    cpap   Past Surgical History:  Procedure Laterality Date   ANTERIOR CERVICAL DECOMP/DISCECTOMY FUSION N/A 02/03/2013   Procedure: ACDF C6 - 7     1 LEVEL;  Surgeon: Venita Lick, MD;  Location: [facility] OR;  Service: Orthopedics;  Laterality: N/A;   APPENDECTOMY     BASAL CELL CARCINOMA EXCISION     BRONCHIAL BIOPSY  01/07/2022   Procedure: BRONCHIAL BIOPSIES;  Surgeon: Josephine Igo, DO;  Location: [facility];  Service: Pulmonary;;   BRONCHIAL NEEDLE ASPIRATION BIOPSY  01/07/2022   Procedure: BRONCHIAL NEEDLE ASPIRATION BIOPSIES;  Surgeon: Josephine Igo, DO;  Location: [facility];  Service: Pulmonary;;   CERVICAL FUSION  02/03/2013   Dr Shon Baton   FRACTURE SURGERY Left    tib/fib/femur   TESTICLE REMOVAL Left    TONSILLECTOMY     Patient Active Problem List   Diagnosis Date Noted   Lung nodule 12/20/2021   Right upper  lobe pulmonary nodule 09/18/2021   HEMORRHOIDS, INTERNAL 07/15/2007   RHINITIS 07/15/2007   GASTRITIS 07/15/2007   Duodenitis 07/15/2007   DIVERTICULOSIS OF COLON 07/15/2007   ARTHRITIS 07/15/2007   COLONIC POLYPS, HYPERPLASTIC, HX OF 07/15/2007    PCP: [name], MD  REFERRING PROVIDER: Madelyn Brunner, DO  REFERRING DIAG: Chronic Lt sided LBP without sciatica; chronic Lt SI joint pain; history of herniated intervertebral disc  Rationale for Evaluation and Treatment: Rehabilitation  THERAPY DIAG:  Other abnormalities of gait and mobility  Chronic bilateral low back pain with right-sided sciatica  Muscle weakness (generalized)  ONSET DATE: November 30 2022  SUBJECTIVE:  SUBJECTIVE STATEMENT: He indicated feeling some stiffness with prolonged sitting but was able to walk and do some rides at [facility].   PERTINENT HISTORY:  Kidney stones, arthritis, herniated disc, diagnosed with Parkinson's 2 years ago  PAIN:   NPRS scale: at worst since last visit 3.5 Pain location: Lt SI region Pain description: stiffness, can be shooting/sharp Aggravating factors: tightness with sitting Relieving factors: avoiding aggravating activities  PRECAUTIONS: None  RED FLAGS: None   WEIGHT BEARING RESTRICTIONS: No  FALLS:  Has patient fallen in last 6 months? No  LIVING ENVIRONMENT: Lives with: lives with their spouse Lives in: House/apartment Stairs: Yes outside deck 3 steps  Has following equipment at home: Single point cane  OCCUPATION: retired  PLOF: Independent  PATIENT GOALS: improve pain with daily activity   OBJECTIVE:   PATIENT SURVEYS:  01/13/2023:  FOTO  01/01/23 FOTO 40  SCREENING FOR RED FLAGS: 01/01/23 Bowel or bladder incontinence: No Spinal tumors: No Cauda equina syndrome:  No Compression fracture: No Abdominal aneurysm: No  COGNITION: 01/01/23 Overall cognitive status: Within functional limits for tasks assessed     SENSATION: 01/01/23 [facility]  MUSCLE LENGTH: 01/01/23 Hamstrings: Right WNL deg; Left WNL deg Maisie Fus test: Right  deg; Left 0 deg hip flexor  POSTURE: 01/01/23 rounded shoulders and forward head  PALPATION: 01/01/23 Tenderness Lt SI joint, L4/L5 paraspinals, Lt proximal glutes  LUMBAR ROM:   AROM 01/01/23 01/07/2023  Flexion Pain free Mid shin with tightness in legs   Extension Pain free REIS x10 reps no change REIP x10 reps pain decreased to 3/10 100 % WFL without symptoms  Right lateral flexion  Mild pull Lt lumbar, to mid thigh  Left lateral flexion  To lateral knee jt no complaint  Right rotation Pain Lt low back   Left rotation Pain free     (Blank rows = not tested)  LOWER EXTREMITY ROM:       Right eval Left eval  Hip flexion    Hip extension    Hip abduction    Hip adduction    Hip internal rotation    Hip external rotation    Knee flexion    Knee extension    Ankle dorsiflexion    Ankle plantarflexion    Ankle inversion    Ankle eversion     (Blank rows = not tested)  LOWER EXTREMITY MMT:    MMT Right 01/01/23 Left 01/01/23   Hip flexion 5 5   Hip extension 4 3   Hip abduction 4 3   Hip adduction     Hip internal rotation     Hip external rotation     Knee flexion 5 5   Knee extension     Ankle dorsiflexion     Ankle plantarflexion     Ankle inversion     Ankle eversion      (Blank rows = not tested)  LUMBAR SPECIAL TESTS: 01/01/23   SI Compression/distraction test: Negative, FABER test: Negative, and Gaenslen's test: Negative  FUNCTIONAL TESTS:  01/01/23 5 times sit to stand: 18 sec  GAIT: 01/01/23 Distance walked: 20 Assistive device utilized: None  Level of assistance: Complete Independence Comments: decreased stance time on Lt, (+) Lt SI region pain with weightbearing  Lt                    TODAY'S TREATMENT:  DATE: 01/13/2023 Therex: Nustep lvl 5 10 mins UE/LE c cues for aerobic exercise routine into gym activity on own.  Lumbar extension standing AROM x 5 Seated multifidi lift up UE isometric hold 10 sec x 10 Seated pball press down 10 sec hold x 10  Supine lumbar trunk rotation 15 sec x 5 bilateral Supine bridge 2-3 sec hold 2 x 10  Standing green band rows 2 x 15 c transverse abdominus contraction Standing green band GH ext 2 x 15 c  transverse abdominus contraction  TODAY'S TREATMENT:                       DATE: 01/07/2023 Therex: Lumbar extension standing AROM x 5 Supine lumbar trunk rotation 15 sec x 5 bilateral Supine bridge 2-3 sec hold 2 x 10 Supine pball press down 5 sec hold x 15 submax with UE Nustep lvl 5 10 mins UE/LE c cues for aerobic exercise routine into gym activity on own.   Adjusted prone activity to avoid abdomen related symptoms.  Education and review of new handout with exercise routine.   TODAY'S TREATMENT:                       DATE: 01/01/23 Prone press on elbows x10 reps, no change Prone press up with arms extended x10 reps, pain decreased to 3/10 Prone core activation with hip extension x10 reps each side Supine hip flexor stretch Lt 1x30 sec       PATIENT EDUCATION:  01/07/2023 Education details: HEP update Person educated: Patient Education method: Explanation, Verbal cues, and Handouts Education comprehension: verbalized understanding and returned demonstration  HOME EXERCISE PROGRAM: Access Code: [email or web address] URL: [email or web address] Date: 01/07/2023 Prepared by: Chyrel Masson  Exercises - Standing Lumbar Extension with Counter  - 3-5 x daily - 7 x weekly - 1 sets - 5-10 reps - Supine Lower Trunk Rotation  - 2-3 x daily - 7 x weekly - 1 sets - 3-5 reps - 15 hold - Supine Bridge  - 1-2 x daily - 7 x weekly - 1-2 sets - 10 reps - 2 hold - Hip Flexor Stretch at Edge of  Bed  - 2 x daily - 7 x weekly - 30 second hold  ASSESSMENT:  CLINICAL IMPRESSION: Good recall of HEP and good toleration to increased physical activity on vacation trip.  Due to general improvements, Pt was in agreement with reduced frequency to 1x/week to check how HEP transitioning would feel.   OBJECTIVE IMPAIRMENTS: Abnormal gait, decreased activity tolerance, decreased mobility, difficulty walking, decreased strength, increased muscle spasms, impaired flexibility, improper body mechanics, and pain.   ACTIVITY LIMITATIONS: sitting, bed mobility, and locomotion level  PARTICIPATION LIMITATIONS: community activity and yard work  PERSONAL FACTORS: Age, Fitness, Past/current experiences, Time since onset of injury/illness/exacerbation, and 1 comorbidity: history of LBP and herniated disc  are also affecting patient's functional outcome.   REHAB POTENTIAL: Good  CLINICAL DECISION MAKING: Stable/uncomplicated  EVALUATION COMPLEXITY: Moderate   GOALS: Goals reviewed with patient? Yes  SHORT TERM GOALS: Target date: 01/08/23  Pt will be independent with his initial HEP to improve strength and flexibility.  Baseline: Goal status: Met 01/07/2023   LONG TERM GOALS: Target date: 02/12/23  Pt will report atleast 60% improvement in his pain from the start of PT. Baseline:  Goal status: on going 01/13/2023  2.  Pt will have improved Lt hip strength to 5/5 MMT. Baseline:  Goal status: on going 01/13/2023  3.  Pt will have improved Lt hip extension flexibility and ROM evident by atleast 5 deg improvement of hip extension noted during [place] test.  Baseline:  Goal status: on going 01/13/2023  4.  Pt will be independent with a maintenance exercise program for land/pool that he can complete after discharge from PT.  Baseline:  Goal status: on going 01/13/2023  5.  Pt have pain free lumbar AROM. Baseline:  Goal status: on going 01/13/2023    PLAN:  PT FREQUENCY: 2x/week  PT  DURATION: 6 weeks  PLANNED INTERVENTIONS: Therapeutic exercises, Therapeutic activity, Neuromuscular re-education, Balance training, Gait training, Patient/Family education, Self Care, Joint mobilization, Aquatic Therapy, Dry Needling, Cryotherapy, Moist heat, Taping, Manual therapy, and Re-evaluation.  PLAN FOR NEXT SESSION:  How did a week go?  FOTO update    Chyrel Masson, PT, DPT, OCS, ATC 01/13/23  11:28 AM

## 2023-01-15 ENCOUNTER — Encounter: Payer: Federal, State, Local not specified - PPO | Admitting: Rehabilitative and Restorative Service Providers"

## 2023-01-20 ENCOUNTER — Ambulatory Visit: Payer: Federal, State, Local not specified - PPO | Admitting: Rehabilitative and Restorative Service Providers"

## 2023-01-20 ENCOUNTER — Encounter: Payer: Self-pay | Admitting: Rehabilitative and Restorative Service Providers"

## 2023-01-20 DIAGNOSIS — M6281 Muscle weakness (generalized): Secondary | ICD-10-CM

## 2023-01-20 DIAGNOSIS — R2689 Other abnormalities of gait and mobility: Secondary | ICD-10-CM | POA: Diagnosis not present

## 2023-01-20 DIAGNOSIS — M5441 Lumbago with sciatica, right side: Secondary | ICD-10-CM

## 2023-01-20 DIAGNOSIS — G8929 Other chronic pain: Secondary | ICD-10-CM | POA: Diagnosis not present

## 2023-01-20 NOTE — Therapy (Addendum)
OUTPATIENT PHYSICAL THERAPY TREATMENT / DISCHARGE   Patient Name: Chad Knapp MRN: [id number] DOB:June 19, 1951, 72 y.o., male Today's Date: 01/20/2023  END OF SESSION:  PT End of Session - 01/20/23 1107     Visit Number 4    Number of Visits 12    Date for PT Re-Evaluation 02/12/23    Authorization Type BC[employer]    Authorization Time Period 01/01/23 to 02/12/23    Progress Note Due on Visit 10    PT Start Time 1058    PT Stop Time 1137    PT Time Calculation (min) 39 min    Activity Tolerance Patient tolerated treatment well    Behavior During Therapy [facility] for tasks assessed/performed                Past Medical History:  Diagnosis Date   Anxiety    Appendicitis    Arthritis    Cancer (HCC)    basal cell -removed - back, shoulder and chest, squamous cell on nose - removed   COVID 12/2021   no symptoms per pt.   Depression    GERD (gastroesophageal reflux disease)    History of kidney stones    Hx of transfusion of packed red blood cells 06/23/1972   Hypertension    Non Hodgkin's lymphoma (HCC)    Pneumonia    Shortness of breath    Sleep apnea    cpap   Past Surgical History:  Procedure Laterality Date   ANTERIOR CERVICAL DECOMP/DISCECTOMY FUSION N/A 02/03/2013   Procedure: ACDF C6 - 7     1 LEVEL;  Surgeon: Venita Lick, MD;  Location: [facility] OR;  Service: Orthopedics;  Laterality: N/A;   APPENDECTOMY     BASAL CELL CARCINOMA EXCISION     BRONCHIAL BIOPSY  01/07/2022   Procedure: BRONCHIAL BIOPSIES;  Surgeon: Josephine Igo, DO;  Location: [facility];  Service: Pulmonary;;   BRONCHIAL NEEDLE ASPIRATION BIOPSY  01/07/2022   Procedure: BRONCHIAL NEEDLE ASPIRATION BIOPSIES;  Surgeon: Josephine Igo, DO;  Location: [facility];  Service: Pulmonary;;   CERVICAL FUSION  02/03/2013   Dr Shon Baton   FRACTURE SURGERY Left    tib/fib/femur   TESTICLE REMOVAL Left    TONSILLECTOMY     Patient Active Problem List   Diagnosis Date Noted   Lung nodule 12/20/2021    Right upper lobe pulmonary nodule 09/18/2021   HEMORRHOIDS, INTERNAL 07/15/2007   RHINITIS 07/15/2007   GASTRITIS 07/15/2007   Duodenitis 07/15/2007   DIVERTICULOSIS OF COLON 07/15/2007   ARTHRITIS 07/15/2007   COLONIC POLYPS, HYPERPLASTIC, HX OF 07/15/2007    PCP: [name], MD  REFERRING PROVIDER: Madelyn Brunner, DO  REFERRING DIAG: Chronic Lt sided LBP without sciatica; chronic Lt SI joint pain; history of herniated intervertebral disc  Rationale for Evaluation and Treatment: Rehabilitation  THERAPY DIAG:  Other abnormalities of gait and mobility  Chronic bilateral low back pain with right-sided sciatica  Muscle weakness (generalized)  ONSET DATE: November 30 2022  SUBJECTIVE:  SUBJECTIVE STATEMENT: Pt indicated he felt like he kept getting improvements.  Reported mild symptoms at worst.  Reported an occasional random sharp pain in Lt side at times while being in bed but not sure why.  Thought he could do some time work   PERTINENT HISTORY:  Kidney stones, arthritis, herniated disc, diagnosed with Parkinson's 2 years ago  PAIN:   NPRS scale: at worst 2.5 Pain location: Lt SI region Pain description: stiffness, can be shooting/sharp Aggravating factors: tightness with sitting Relieving factors: avoiding aggravating activities  PRECAUTIONS: None  RED FLAGS: None   WEIGHT BEARING RESTRICTIONS: No  FALLS:  Has patient fallen in last 6 months? No  LIVING ENVIRONMENT: Lives with: lives with their spouse Lives in: House/apartment Stairs: Yes outside deck 3 steps  Has following equipment at home: Single point cane  OCCUPATION: retired  PLOF: Independent  PATIENT GOALS: improve pain with daily activity   OBJECTIVE:   PATIENT SURVEYS:  01/20/2023:  FOTO update:   87  01/01/23 FOTO 40  SCREENING FOR RED FLAGS: 01/01/23 Bowel or bladder incontinence: No Spinal tumors: No Cauda equina syndrome: No Compression fracture: No Abdominal aneurysm: No  COGNITION: 01/01/23 Overall cognitive status: Within functional limits for tasks assessed     SENSATION: 01/01/23 [facility]  MUSCLE LENGTH: 01/01/23 Hamstrings: Right WNL deg; Left WNL deg Maisie Fus test: Right  deg; Left 0 deg hip flexor  POSTURE: 01/01/23 rounded shoulders and forward head  PALPATION: 01/01/23 Tenderness Lt SI joint, L4/L5 paraspinals, Lt proximal glutes  LUMBAR ROM:   AROM 01/01/23 01/07/2023 01/20/2023  Flexion Pain free Mid shin with tightness in legs    Extension Pain free REIS x10 reps no change REIP x10 reps pain decreased to 3/10 100 % WFL without symptoms 100 % WFL Without symptoms  Right lateral flexion  Mild pull Lt lumbar, to mid thigh   Left lateral flexion  To lateral knee jt no complaint   Right rotation Pain Lt low back    Left rotation Pain free      (Blank rows = not tested)  LOWER EXTREMITY ROM:       Right eval Left eval  Hip flexion    Hip extension    Hip abduction    Hip adduction    Hip internal rotation    Hip external rotation    Knee flexion    Knee extension    Ankle dorsiflexion    Ankle plantarflexion    Ankle inversion    Ankle eversion     (Blank rows = not tested)  LOWER EXTREMITY MMT:    MMT Right 01/01/23 Left 01/01/23 Right 01/20/2023 Left 01/20/2023  Hip flexion 5 5 5/5 5/5  Hip extension 4 3 5/5 5/5  Hip abduction 4 3 5/5 5/5  Hip adduction      Hip internal rotation      Hip external rotation      Knee flexion 5 5    Knee extension      Ankle dorsiflexion      Ankle plantarflexion      Ankle inversion      Ankle eversion       (Blank rows = not tested)  LUMBAR SPECIAL TESTS: 01/01/23   SI Compression/distraction test: Negative, FABER test: Negative, and Gaenslen's test: Negative  FUNCTIONAL TESTS:  01/01/23 5  times sit to stand: 18 sec  GAIT: 01/01/23 Distance walked: 20 Assistive device utilized: None  Level of assistance: Complete Independence Comments: decreased stance time on Lt, (+)  Lt SI region pain with weightbearing Lt                    TODAY'S TREATMENT:                       DATE: 01/20/2023 Therex: Nustep lvl 6 15 mins UE/LE c cues for aerobic exercise routine into gym activity on own.  Lumbar extension standing AROM x 5 Supine lumbar trunk rotation 15 sec x 5 bilateral Supine bridge 2-3 sec hold 2 x 10  Standing green band rows  x 15 c transverse abdominus contraction Standing green band GH ext x 15 c  transverse abdominus contraction  Time spent in review of HEP techniques, handout update provided.    TODAY'S TREATMENT:                       DATE: 01/13/2023 Therex: Nustep lvl 5 10 mins UE/LE c cues for aerobic exercise routine into gym activity on own.  Lumbar extension standing AROM x 5 Seated multifidi lift up UE isometric hold 10 sec x 10 Seated pball press down 10 sec hold x 10  Supine lumbar trunk rotation 15 sec x 5 bilateral Supine bridge 2-3 sec hold 2 x 10  Standing green band rows 2 x 15 c transverse abdominus contraction Standing green band GH ext 2 x 15 c  transverse abdominus contraction  TODAY'S TREATMENT:                       DATE: 01/07/2023 Therex: Lumbar extension standing AROM x 5 Supine lumbar trunk rotation 15 sec x 5 bilateral Supine bridge 2-3 sec hold 2 x 10 Supine pball press down 5 sec hold x 15 submax with UE Nustep lvl 5 10 mins UE/LE c cues for aerobic exercise routine into gym activity on own.   Adjusted prone activity to avoid abdomen related symptoms.  Education and review of new handout with exercise routine.   TODAY'S TREATMENT:                       DATE: 01/01/23 Prone press on elbows x10 reps, no change Prone press up with arms extended x10 reps, pain decreased to 3/10 Prone core activation with hip extension x10 reps each  side Supine hip flexor stretch Lt 1x30 sec       PATIENT EDUCATION:  01/20/2023 Education details: HEP update Person educated: Patient Education method: Explanation, Verbal cues, and Handouts Education comprehension: verbalized understanding and returned demonstration  HOME EXERCISE PROGRAM: Access Code: [email or web address] URL: [email or web address] Date: 01/20/2023 Prepared by: Chyrel Masson  Exercises - Standing Lumbar Extension with Counter  - 3-5 x daily - 7 x weekly - 1 sets - 5-10 reps - Supine Lower Trunk Rotation  - 2-3 x daily - 7 x weekly - 1 sets - 3-5 reps - 15 hold - Supine Bridge  - 1-2 x daily - 7 x weekly - 1-2 sets - 10 reps - 2 hold - Hip Flexor Stretch at Edge of Bed  - 2 x daily - 7 x weekly - 30 second hold - Standing Bilateral Low Shoulder Row with Anchored Resistance  - 1-2 x daily - 7 x weekly - 2-3 sets - 10-15 reps - Shoulder Extension with Resistance  - 1-2 x daily - 7 x weekly - 1-2 sets - 10-15 reps - Seated Multifidi Isometric  - 2-3 x daily -  7 x weekly - 1 sets - 10 reps - 5-10 hold  ASSESSMENT:  CLINICAL IMPRESSION: Good symptoms reports continued.  FOTO update showed great improvement.  Recommended several weeks of HEP trial with patient in agreement with plan.    OBJECTIVE IMPAIRMENTS: Abnormal gait, decreased activity tolerance, decreased mobility, difficulty walking, decreased strength, increased muscle spasms, impaired flexibility, improper body mechanics, and pain.   ACTIVITY LIMITATIONS: sitting, bed mobility, and locomotion level  PARTICIPATION LIMITATIONS: community activity and yard work  PERSONAL FACTORS: Age, Fitness, Past/current experiences, Time since onset of injury/illness/exacerbation, and 1 comorbidity: history of LBP and herniated disc  are also affecting patient's functional outcome.   REHAB POTENTIAL: Good  CLINICAL DECISION MAKING: Stable/uncomplicated  EVALUATION COMPLEXITY: Moderate   GOALS: Goals reviewed  with patient? Yes  SHORT TERM GOALS: Target date: 01/08/23  Pt will be independent with his initial HEP to improve strength and flexibility.  Baseline: Goal status: Met 01/07/2023   LONG TERM GOALS: Target date: 02/12/23  Pt will report atleast 60% improvement in his pain from the start of PT. Baseline:  Goal status: Met 01/20/2023  2.  Pt will have improved Lt hip strength to 5/5 MMT. Baseline:  Goal status:Met 01/20/2023  3.  Pt will have improved Lt hip extension flexibility and ROM evident by atleast 5 deg improvement of hip extension noted during Kian test.  Baseline:  Goal status:  mostly Met 01/20/2023  4.  Pt will be independent with a maintenance exercise program for land/pool that he can complete after discharge from PT.  Baseline:  Goal status: Met 01/20/2023  5.  Pt have pain free lumbar AROM. Baseline:  Goal status: Met 01/20/2023    PLAN:  PT FREQUENCY: 2x/week  PT DURATION: 6 weeks  PLANNED INTERVENTIONS: Therapeutic exercises, Therapeutic activity, Neuromuscular re-education, Balance training, Gait training, Patient/Family education, Self Care, Joint mobilization, Aquatic Therapy, Dry Needling, Cryotherapy, Moist heat, Taping, Manual therapy, and Re-evaluation.  PLAN FOR NEXT SESSION:  Trial HEP. Pt has appointment 3 weeks out from today's visit.    Chyrel Masson, PT, DPT, OCS, ATC 01/20/23  11:41 AM  PHYSICAL THERAPY DISCHARGE SUMMARY  Visits from Start of Care: 4  Current functional level related to goals / functional outcomes: See note   Remaining deficits: See note   Education / Equipment: HEP  Patient goals were met. Patient is being discharged due to not returning since the last visit.  Chyrel Masson, PT, DPT, OCS, ATC 03/04/23  3:33 PM

## 2023-01-22 ENCOUNTER — Encounter: Payer: Federal, State, Local not specified - PPO | Admitting: Rehabilitative and Restorative Service Providers"

## 2023-01-27 ENCOUNTER — Encounter: Payer: Federal, State, Local not specified - PPO | Admitting: Rehabilitative and Restorative Service Providers"

## 2023-01-29 ENCOUNTER — Encounter: Payer: Federal, State, Local not specified - PPO | Admitting: Rehabilitative and Restorative Service Providers"

## 2023-01-30 ENCOUNTER — Ambulatory Visit: Payer: Federal, State, Local not specified - PPO | Admitting: Sports Medicine

## 2023-02-10 ENCOUNTER — Encounter: Payer: Federal, State, Local not specified - PPO | Admitting: Rehabilitative and Restorative Service Providers"

## 2023-02-24 ENCOUNTER — Encounter: Payer: Self-pay | Admitting: Hematology

## 2023-03-03 ENCOUNTER — Encounter: Payer: Self-pay | Admitting: Family Medicine

## 2023-03-03 ENCOUNTER — Ambulatory Visit (INDEPENDENT_AMBULATORY_CARE_PROVIDER_SITE_OTHER): Payer: Federal, State, Local not specified - PPO | Admitting: Family Medicine

## 2023-03-03 VITALS — BP 145/91 | HR 72 | Temp 97.6°F | Resp 18 | Ht 75.0 in | Wt 270.6 lb

## 2023-03-03 DIAGNOSIS — Z0000 Encounter for general adult medical examination without abnormal findings: Secondary | ICD-10-CM

## 2023-03-03 DIAGNOSIS — Z13220 Encounter for screening for lipoid disorders: Secondary | ICD-10-CM

## 2023-03-03 DIAGNOSIS — Z130 Encounter for screening for diseases of the blood and blood-forming organs and certain disorders involving the immune mechanism: Secondary | ICD-10-CM

## 2023-03-03 NOTE — Progress Notes (Unsigned)
Established Patient Office Visit  Subjective    Patient ID: Chad Knapp, male    DOB: 06/29/1950  Age: 72 y.o. MRN: [id number]  CC:  Chief Complaint  Patient presents with   Annual Exam    HPI Chad Knapp presents for annual exam. Patient denies acute complaints.   Outpatient Encounter Medications as of 03/03/2023  Medication Sig   acetaminophen (TYLENOL) 500 MG tablet Take 1,000 mg by mouth every 6 (six) hours as needed for moderate pain.   albuterol (PROVENTIL HFA;VENTOLIN HFA) 108 (90 BASE) MCG/ACT inhaler Inhale 2 puffs into the lungs every 4 (four) hours as needed for wheezing or shortness of breath.   camphor-menthol (SARNA) lotion Apply 1 Application topically as needed for itching.   carbidopa-levodopa (SINEMET IR) 25-100 MG tablet Take 2.5 tablets by mouth 3 (three) times daily.   famotidine (PEPCID) 40 MG tablet TAKE ONE TABLET BY MOUTH DAILY FOR ACID REFLUX - REPLACES PANTOPRAZOLE   fluticasone (FLONASE) 50 MCG/ACT nasal spray Place 2 sprays into the nose daily as needed for rhinitis or allergies.   gabapentin (NEURONTIN) 100 MG capsule Take 200 mg by mouth 3 (three) times daily.   ibuprofen (ADVIL) 800 MG tablet Take 800 mg by mouth every 8 (eight) hours as needed for moderate pain.   Lactobacillus (ACIDOPHILUS/BIFIDUS PO) Take 2 tablets by mouth daily.   loratadine (ALLERGY) 10 MG tablet Take 10 mg by mouth daily. [employer] brand   losartan (COZAAR) 100 MG tablet Take 50 mg by mouth daily.   methocarbamol (ROBAXIN) 500 MG tablet Take 500 mg by mouth every 8 (eight) hours as needed for muscle spasms.   Multiple Vitamin (MULTIVITAMIN WITH MINERALS) TABS tablet Take 1 tablet by mouth daily.   ofloxacin (OCUFLOX) 0.3 % ophthalmic solution Place 1 drop into the left eye 4 (four) times daily.   Omega-3 Fatty Acids (OMEGA 3 PO) Take 600 mg by mouth daily.   oxyCODONE (ROXICODONE) 5 MG immediate release tablet Take 1 tablet (5 mg total) by mouth every 6 (six) hours as  needed for severe pain.   pantoprazole (PROTONIX) 40 MG tablet Take 40 mg by mouth daily.   propranolol (INDERAL) 20 MG tablet Take 20 mg by mouth 3 (three) times daily.   rasagiline (AZILECT) 1 MG TABS tablet TAKE ONE TABLET BY MOUTH ONCE A DAY TO SLOW PARKINSON'S DISEASE - NOT A DOSE CHANGE   tamsulosin (FLOMAX) 0.4 MG CAPS capsule Take 1 capsule (0.4 mg total) by mouth daily.   Tiotropium Bromide-Olodaterol (STIOLTO RESPIMAT) 2.5-2.5 MCG/ACT AERS Inhale 2 puffs into the lungs daily.   Tiotropium Bromide-Olodaterol (STIOLTO RESPIMAT) 2.5-2.5 MCG/ACT AERS Inhale 2 puffs into the lungs daily.   venlafaxine XR (EFFEXOR-XR) 37.5 MG 24 hr capsule Take 37.5 mg by mouth daily.   No facility-administered encounter medications on file as of 03/03/2023.    Past Medical History:  Diagnosis Date   Anxiety    Appendicitis    Arthritis    Cancer (HCC)    basal cell -removed - back, shoulder and chest, squamous cell on nose - removed   COVID 12/2021   no symptoms per pt.   Depression    GERD (gastroesophageal reflux disease)    History of kidney stones    Hx of transfusion of packed red blood cells 06/23/1972   Hypertension    Non Hodgkin's lymphoma (HCC)    Pneumonia    Shortness of breath    Sleep apnea    cpap  Past Surgical History:  Procedure Laterality Date   ANTERIOR CERVICAL DECOMP/DISCECTOMY FUSION N/A 02/03/2013   Procedure: ACDF C6 - 7     1 LEVEL;  Surgeon: Venita Lick, MD;  Location: [facility] OR;  Service: Orthopedics;  Laterality: N/A;   APPENDECTOMY     BASAL CELL CARCINOMA EXCISION     BRONCHIAL BIOPSY  01/07/2022   Procedure: BRONCHIAL BIOPSIES;  Surgeon: Josephine Igo, DO;  Location: [facility];  Service: Pulmonary;;   BRONCHIAL NEEDLE ASPIRATION BIOPSY  01/07/2022   Procedure: BRONCHIAL NEEDLE ASPIRATION BIOPSIES;  Surgeon: Josephine Igo, DO;  Location: [facility];  Service: Pulmonary;;   CERVICAL FUSION  02/03/2013   Dr Shon Baton   FRACTURE SURGERY Left     tib/fib/femur   TESTICLE REMOVAL Left    TONSILLECTOMY      Family History  Problem Relation Age of Onset   Mental illness Mother    Dementia Mother    Other Father        heart attack or stroke   Melanoma Brother     Social History   Socioeconomic History   Marital status: Married    Spouse name: Not on file   Number of children: Not on file   Years of education: Not on file   Highest education level: Not on file  Occupational History   Occupation: retired  Tobacco Use   Smoking status: Former    Current packs/day: 0.00    Average packs/day: 0.5 packs/day for 37.0 years (18.5 ttl pk-yrs)    Types: Cigarettes    Start date: 39    Quit date: 06/20/2007    Years since quitting: 15.7   Smokeless tobacco: Never  Vaping Use   Vaping status: Never Used  Substance and Sexual Activity   Alcohol use: Yes    Comment: maybe 2 beers a week   Drug use: Not Currently    Types: Marijuana    Comment: 01/06/22 - none for 6 months   Sexual activity: Yes  Other Topics Concern   Not on file  Social History Narrative   2 adopted children from [place], son and daughter   Social Determinants of Health   Financial Resource Strain: Low Risk  (03/03/2023)   Overall Financial Resource Strain (CARDIA)    Difficulty of Paying Living Expenses: Not hard at all  Food Insecurity: No Food Insecurity (03/03/2023)   Hunger Vital Sign    Worried About Running Out of Food in the Last Year: Never true    Ran Out of Food in the Last Year: Never true  Transportation Needs: No Transportation Needs (03/03/2023)   PRAPARE - Administrator, Civil Service (Medical): No    Lack of Transportation (Non-Medical): No  Physical Activity: Insufficiently Active (03/03/2023)   Exercise Vital Sign    Days of Exercise per Week: 4 days    Minutes of Exercise per Session: 30 min  Stress: No Stress Concern Present (03/03/2023)   [employer] of Occupational Health - Occupational Stress Questionnaire     Feeling of Stress : Not at all  Social Connections: Socially Integrated (03/03/2023)   Social Connection and Isolation Panel [NHANES]    Frequency of Communication with Friends and Family: More than three times a week    Frequency of Social Gatherings with Friends and Family: More than three times a week    Attends Religious Services: More than 4 times per year    Active Member of [employer] or Organizations: Yes  Attends Banker Meetings: More than 4 times per year    Marital Status: Married  Catering manager Violence: Not At Risk (03/03/2023)   Humiliation, Afraid, Rape, and Kick questionnaire    Fear of Current or Ex-Partner: No    Emotionally Abused: No    Physically Abused: No    Sexually Abused: No    Review of Systems  All other systems reviewed and are negative.       Objective    BP (!) 145/91 (BP Location: Right Arm, Patient Position: Sitting, Cuff Size: Large)   Pulse 72   Temp 97.6 F (36.4 C) (Oral)   Resp 18   Ht 6\' 3"  (1.905 m)   Wt 270 lb 9.6 oz (122.7 kg)   SpO2 95%   BMI 33.82 kg/m   Physical Exam Vitals and nursing note reviewed.  Constitutional:      General: He is not in acute distress. HENT:     Head: Normocephalic and atraumatic.     Right Ear: Tympanic membrane, ear canal and external ear normal.     Left Ear: Tympanic membrane, ear canal and external ear normal.     Nose: Nose normal.     Mouth/Throat:     Mouth: Mucous membranes are moist.     Pharynx: Oropharynx is clear.  Eyes:     Conjunctiva/sclera: Conjunctivae normal.     Pupils: Pupils are equal, round, and reactive to light.  Neck:     Thyroid: No thyromegaly.  Cardiovascular:     Rate and Rhythm: Normal rate and regular rhythm.     Heart sounds: Normal heart sounds. No murmur heard. Pulmonary:     Effort: Pulmonary effort is normal.     Breath sounds: Normal breath sounds.  Abdominal:     General: There is no distension.     Palpations: Abdomen is soft. There  is no mass.     Tenderness: There is no abdominal tenderness.     Hernia: There is no hernia in the left inguinal area or right inguinal area.  Musculoskeletal:        General: Normal range of motion.     Cervical back: Normal range of motion and neck supple.     Right lower leg: No edema.     Left lower leg: No edema.  Skin:    General: Skin is warm and dry.  Neurological:     General: No focal deficit present.     Mental Status: He is alert and oriented to person, place, and time. Mental status is at baseline.  Psychiatric:        Mood and Affect: Mood normal.        Behavior: Behavior normal.     {Labs (Optional):23779}    Assessment & Plan:   Annual physical exam -     Comprehensive metabolic panel  Screening for deficiency anemia -     CBC with Differential/Platelet  Screening for lipid disorders -     Lipid panel     No follow-ups on file.   Tommie Raymond, MD

## 2023-03-03 NOTE — Progress Notes (Unsigned)
Annual Exam

## 2023-03-04 ENCOUNTER — Encounter: Payer: Self-pay | Admitting: Family Medicine

## 2023-03-04 LAB — COMPREHENSIVE METABOLIC PANEL
ALT: 16 IU/L (ref 0–44)
AST: 15 IU/L (ref 0–40)
Albumin: 4.2 g/dL (ref 3.8–4.8)
Alkaline Phosphatase: 78 IU/L (ref 44–121)
BUN/Creatinine Ratio: 15 (ref 10–24)
BUN: 14 mg/dL (ref 8–27)
Bilirubin Total: 0.5 mg/dL (ref 0.0–1.2)
CO2: 22 mmol/L (ref 20–29)
Calcium: 9.2 mg/dL (ref 8.6–10.2)
Chloride: 104 mmol/L (ref 96–106)
Creatinine, Ser: 0.93 mg/dL (ref 0.76–1.27)
Globulin, Total: 2.6 g/dL (ref 1.5–4.5)
Glucose: 92 mg/dL (ref 70–99)
Potassium: 4.2 mmol/L (ref 3.5–5.2)
Sodium: 140 mmol/L (ref 134–144)
Total Protein: 6.8 g/dL (ref 6.0–8.5)
eGFR: 87 mL/min/{1.73_m2} (ref >59)

## 2023-03-04 LAB — CBC WITH DIFFERENTIAL/PLATELET
Basophils Absolute: 0.1 10*3/uL (ref 0.0–0.2)
Basos: 1 %
EOS (ABSOLUTE): 0.3 10*3/uL (ref 0.0–0.4)
Eos: 4 %
Hematocrit: 47.2 % (ref 37.5–51.0)
Hemoglobin: 15.7 g/dL (ref 13.0–17.7)
Immature Grans (Abs): 0 10*3/uL (ref 0.0–0.1)
Immature Granulocytes: 1 %
Lymphocytes Absolute: 1.6 10*3/uL (ref 0.7–3.1)
Lymphs: 21 %
MCH: 30.1 pg (ref 26.6–33.0)
MCHC: 33.3 g/dL (ref 31.5–35.7)
MCV: 90 fL (ref 79–97)
Monocytes Absolute: 0.7 10*3/uL (ref 0.1–0.9)
Monocytes: 9 %
Neutrophils Absolute: 4.9 10*3/uL (ref 1.4–7.0)
Neutrophils: 64 %
Platelets: 164 10*3/uL (ref 150–450)
RBC: 5.22 x10E6/uL (ref 4.14–5.80)
RDW: 13.5 % (ref 11.6–15.4)
WBC: 7.5 10*3/uL (ref 3.4–10.8)

## 2023-03-04 LAB — LIPID PANEL
Chol/HDL Ratio: 4.4 ratio (ref 0.0–5.0)
Cholesterol, Total: 186 mg/dL (ref 100–199)
HDL: 42 mg/dL (ref >39)
LDL Chol Calc (NIH): 112 mg/dL — ABNORMAL HIGH (ref 0–99)
Triglycerides: 180 mg/dL — ABNORMAL HIGH (ref 0–149)
VLDL Cholesterol Cal: 32 mg/dL (ref 5–40)

## 2023-04-26 ENCOUNTER — Encounter: Payer: Self-pay | Admitting: Family Medicine

## 2023-04-27 NOTE — Telephone Encounter (Signed)
noted 

## 2023-05-08 ENCOUNTER — Other Ambulatory Visit: Payer: Self-pay

## 2023-05-08 DIAGNOSIS — C8580 Other specified types of non-Hodgkin lymphoma, unspecified site: Secondary | ICD-10-CM

## 2023-05-11 ENCOUNTER — Inpatient Hospital Stay: Payer: Federal, State, Local not specified - PPO | Admitting: Hematology

## 2023-05-11 ENCOUNTER — Inpatient Hospital Stay: Payer: Federal, State, Local not specified - PPO | Attending: Hematology

## 2023-05-11 VITALS — BP 136/58 | HR 68 | Temp 97.8°F | Resp 16 | Ht 75.0 in | Wt 272.0 lb

## 2023-05-11 DIAGNOSIS — C8580 Other specified types of non-Hodgkin lymphoma, unspecified site: Secondary | ICD-10-CM | POA: Diagnosis not present

## 2023-05-11 DIAGNOSIS — C884 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: Secondary | ICD-10-CM | POA: Insufficient documentation

## 2023-05-11 DIAGNOSIS — Z87891 Personal history of nicotine dependence: Secondary | ICD-10-CM | POA: Diagnosis not present

## 2023-05-11 LAB — CBC WITH DIFFERENTIAL (CANCER CENTER ONLY)
Abs Immature Granulocytes: 0.03 10*3/uL (ref 0.00–0.07)
Basophils Absolute: 0.1 10*3/uL (ref 0.0–0.1)
Basophils Relative: 1 %
Eosinophils Absolute: 0.4 10*3/uL (ref 0.0–0.5)
Eosinophils Relative: 5 %
HCT: 43.9 % (ref 39.0–52.0)
Hemoglobin: 15.6 g/dL (ref 13.0–17.0)
Immature Granulocytes: 0 %
Lymphocytes Relative: 21 %
Lymphs Abs: 1.6 10*3/uL (ref 0.7–4.0)
MCH: 31.9 pg (ref 26.0–34.0)
MCHC: 35.5 g/dL (ref 30.0–36.0)
MCV: 89.8 fL (ref 80.0–100.0)
Monocytes Absolute: 0.7 10*3/uL (ref 0.1–1.0)
Monocytes Relative: 10 %
Neutro Abs: 4.8 10*3/uL (ref 1.7–7.7)
Neutrophils Relative %: 63 %
Platelet Count: 133 10*3/uL — ABNORMAL LOW (ref 150–400)
RBC: 4.89 MIL/uL (ref 4.22–5.81)
RDW: 13 % (ref 11.5–15.5)
WBC Count: 7.5 10*3/uL (ref 4.0–10.5)
nRBC: 0 % (ref 0.0–0.2)

## 2023-05-11 LAB — CMP (CANCER CENTER ONLY)
ALT: <5 U/L (ref 0–44)
AST: 15 U/L (ref 15–41)
Albumin: 3.9 g/dL (ref 3.5–5.0)
Alkaline Phosphatase: 62 U/L (ref 38–126)
Anion gap: 4 — ABNORMAL LOW (ref 5–15)
BUN: 19 mg/dL (ref 8–23)
CO2: 29 mmol/L (ref 22–32)
Calcium: 9.5 mg/dL (ref 8.9–10.3)
Chloride: 106 mmol/L (ref 98–111)
Creatinine: 0.94 mg/dL (ref 0.61–1.24)
GFR, Estimated: >60 mL/min (ref >60)
Glucose, Bld: 87 mg/dL (ref 70–99)
Potassium: 4.2 mmol/L (ref 3.5–5.1)
Sodium: 139 mmol/L (ref 135–145)
Total Bilirubin: 0.5 mg/dL (ref <1.2)
Total Protein: 7 g/dL (ref 6.5–8.1)

## 2023-05-11 LAB — LACTATE DEHYDROGENASE: LDH: 167 U/L (ref 98–192)

## 2023-05-11 NOTE — Progress Notes (Signed)
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 male who has been referred to [place] by Dr Skipper Cliche, Lenn Sink for evaluation and consultation of extranodal pulmonary marginal zone lymphoma. He reports He is doing well with no new symptoms or concerns.  He notes history of SOB for about 5-6 years.   History of smoking for 37 years previously. He smoked a pack over 3-4 days. He notes he quit smoking 06/20/2007.  We discussed getting a full body PET scan for further evaluation which he is agreeable to.  He uses a CPAP that he has had for 10 years.   He notes that he has been attempting to lose weight.   We discussed getting baseline labs today for further evaluation which he is agreeable to.  No history of cardiovascular issues.   Previous COVID-19 infection 12/2021 which he notes he experienced no obvious signs or symptoms from.  We discussed that we might take an active surveillance approach but that is to be determined based on labs and PET/CT.  No fever, chills, night sweats. No new lumps, bumps, or lesions/rashes. No abdominal pain or change in bowel habits. No new or unexpected weight loss. No SOB or chest pain. No other new or acute focal symptoms.  We discussed his recent CT chest wo contrast done 11/20/2021.  INTERVAL HISTORY: Chad Knapp is a wonderful 72 y.o. male who is here for continued evaluation and management of extranodal pulmonary marginal zone lymphoma.  I had a tele-med visit with the patient on 11/10/2022 and he was doing well overall.   Patient notes he has been doing well overall since our last visit. Patient notes that couple days after our last visit, he was admitted to the ED  with abdominal pain. He was referred to Gastroenterologist and was diagnosed with diverticulitis. During this visit, he does complain of mild left lower abdominal pain due to constipation. He denies other GI symptoms.   During this visit, patient does complain of mild occasional SOB, cough, and post-nasal drip. He has been following up with his PCP. He does have tumor in his ear and he is following up with his ENT physician.   He denies any new infection issues, fever, chills, night sweats, unexpected weight loss, abdominal pain, abnormal bowel movement, back pain, or leg swelling.   MEDICAL HISTORY:  Past Medical History:  Diagnosis Date   Anxiety    Appendicitis    Arthritis    Cancer (HCC)    basal cell -removed - back, shoulder and chest, squamous cell on nose - removed   COVID 12/2021   no symptoms per pt.   Depression    GERD (gastroesophageal reflux disease)    History of kidney stones    Hx of transfusion of packed red blood cells 06/23/1972   Hypertension    Non Hodgkin's lymphoma (HCC)    Pneumonia    Shortness of breath    Sleep apnea    cpap    SURGICAL HISTORY: Past Surgical History:  Procedure Laterality Date   ANTERIOR CERVICAL DECOMP/DISCECTOMY FUSION N/A 02/03/2013   Procedure: ACDF C6 - 7     1 LEVEL;  Surgeon: Venita Lick, MD;  Location: [facility];  Service: Orthopedics;  Laterality:  N/A;   APPENDECTOMY     BASAL CELL CARCINOMA EXCISION     BRONCHIAL BIOPSY  01/07/2022   Procedure: BRONCHIAL BIOPSIES;  Surgeon: Josephine Igo, DO;  Location: [facility];  Service: Pulmonary;;   BRONCHIAL NEEDLE ASPIRATION BIOPSY  01/07/2022   Procedure: BRONCHIAL NEEDLE ASPIRATION BIOPSIES;  Surgeon: Josephine Igo, DO;  Location: [facility];  Service: Pulmonary;;   CERVICAL FUSION  02/03/2013   Dr Shon Baton   FRACTURE SURGERY Left    tib/fib/femur   TESTICLE REMOVAL Left    TONSILLECTOMY      SOCIAL HISTORY: Social History   Socioeconomic History   Marital  status: Married    Spouse name: Not on file   Number of children: Not on file   Years of education: Not on file   Highest education level: Not on file  Occupational History   Occupation: retired  Tobacco Use   Smoking status: Former    Current packs/day: 0.00    Average packs/day: 0.5 packs/day for 37.0 years (18.5 ttl pk-yrs)    Types: Cigarettes    Start date: 43    Quit date: 06/20/2007    Years since quitting: 15.9   Smokeless tobacco: Never  Vaping Use   Vaping status: Never Used  Substance and Sexual Activity   Alcohol use: Yes    Comment: maybe 2 beers a week   Drug use: Not Currently    Types: Marijuana    Comment: 01/06/22 - none for 6 months   Sexual activity: Yes  Other Topics Concern   Not on file  Social History Narrative   2 adopted children from [place], son and daughter   Social Determinants of Health   Financial Resource Strain: Low Risk  (03/03/2023)   Overall Financial Resource Strain (CARDIA)    Difficulty of Paying Living Expenses: Not hard at all  Food Insecurity: No Food Insecurity (03/03/2023)   Hunger Vital Sign    Worried About Running Out of Food in the Last Year: Never true    Ran Out of Food in the Last Year: Never true  Transportation Needs: No Transportation Needs (03/03/2023)   PRAPARE - Administrator, Civil Service (Medical): No    Lack of Transportation (Non-Medical): No  Physical Activity: Insufficiently Active (03/03/2023)   Exercise Vital Sign    Days of Exercise per Week: 4 days    Minutes of Exercise per Session: 30 min  Stress: No Stress Concern Present (03/03/2023)   [employer] of Occupational Health - Occupational Stress Questionnaire    Feeling of Stress : Not at all  Social Connections: Socially Integrated (03/03/2023)   Social Connection and Isolation Panel [NHANES]    Frequency of Communication with Friends and Family: More than three times a week    Frequency of Social Gatherings with Friends and Family:  More than three times a week    Attends Religious Services: More than 4 times per year    Active Member of [employer] or Organizations: Yes    Attends Engineer, structural: More than 4 times per year    Marital Status: Married  Catering manager Violence: Not At Risk (03/03/2023)   Humiliation, Afraid, Rape, and Kick questionnaire    Fear of Current or Ex-Partner: No    Emotionally Abused: No    Physically Abused: No    Sexually Abused: No    FAMILY HISTORY: Family History  Problem Relation Age of Onset   Mental illness Mother  Dementia Mother    Other Father        heart attack or stroke   Melanoma Brother     ALLERGIES:  is allergic to lisinopril.  MEDICATIONS:  Current Outpatient Medications  Medication Sig Dispense Refill   acetaminophen (TYLENOL) 500 MG tablet Take 1,000 mg by mouth every 6 (six) hours as needed for moderate pain.     albuterol (PROVENTIL HFA;VENTOLIN HFA) 108 (90 BASE) MCG/ACT inhaler Inhale 2 puffs into the lungs every 4 (four) hours as needed for wheezing or shortness of breath.     camphor-menthol (SARNA) lotion Apply 1 Application topically as needed for itching.     carbidopa-levodopa (SINEMET IR) 25-100 MG tablet Take 2.5 tablets by mouth 3 (three) times daily.     famotidine (PEPCID) 40 MG tablet TAKE ONE TABLET BY MOUTH DAILY FOR ACID REFLUX - REPLACES PANTOPRAZOLE     fluticasone (FLONASE) 50 MCG/ACT nasal spray Place 2 sprays into the nose daily as needed for rhinitis or allergies.     gabapentin (NEURONTIN) 100 MG capsule Take 200 mg by mouth 3 (three) times daily.     ibuprofen (ADVIL) 800 MG tablet Take 800 mg by mouth every 8 (eight) hours as needed for moderate pain.     Lactobacillus (ACIDOPHILUS/BIFIDUS PO) Take 2 tablets by mouth daily.     loratadine (ALLERGY) 10 MG tablet Take 10 mg by mouth daily. [employer] brand     losartan (COZAAR) 100 MG tablet Take 50 mg by mouth daily.     methocarbamol (ROBAXIN) 500 MG tablet Take 500 mg by  mouth every 8 (eight) hours as needed for muscle spasms.     Multiple Vitamin (MULTIVITAMIN WITH MINERALS) TABS tablet Take 1 tablet by mouth daily.     ofloxacin (OCUFLOX) 0.3 % ophthalmic solution Place 1 drop into the left eye 4 (four) times daily. 5 mL 0   Omega-3 Fatty Acids (OMEGA 3 PO) Take 600 mg by mouth daily.     oxyCODONE (ROXICODONE) 5 MG immediate release tablet Take 1 tablet (5 mg total) by mouth every 6 (six) hours as needed for severe pain. 30 tablet 0   pantoprazole (PROTONIX) 40 MG tablet Take 40 mg by mouth daily.     propranolol (INDERAL) 20 MG tablet Take 20 mg by mouth 3 (three) times daily.     rasagiline (AZILECT) 1 MG TABS tablet TAKE ONE TABLET BY MOUTH ONCE A DAY TO SLOW PARKINSON'S DISEASE - NOT A DOSE CHANGE     tamsulosin (FLOMAX) 0.4 MG CAPS capsule Take 1 capsule (0.4 mg total) by mouth daily. 7 capsule 0   Tiotropium Bromide-Olodaterol (STIOLTO RESPIMAT) 2.5-2.5 MCG/ACT AERS Inhale 2 puffs into the lungs daily. 4 g 2   Tiotropium Bromide-Olodaterol (STIOLTO RESPIMAT) 2.5-2.5 MCG/ACT AERS Inhale 2 puffs into the lungs daily. 1 each 6   venlafaxine XR (EFFEXOR-XR) 37.5 MG 24 hr capsule Take 37.5 mg by mouth daily.     No current facility-administered medications for this visit.    REVIEW OF SYSTEMS:   No chest pain no SOB no DOE. No fevers/chills night sweats  Neg other than that noted above  PHYSICAL EXAMINATION:.BP (!) 136/58 (BP Location: Left Arm, Patient Position: Sitting) Comment: nurse notified  Pulse 68   Temp 97.8 F (36.6 C) (Temporal)   Resp 16   Ht 6\' 3"  (1.905 m)   Wt 272 lb (123.4 kg)   SpO2 99%   BMI 34.00 kg/m  . GENERAL:alert, in no acute  distress and comfortable SKIN: no acute rashes, no significant lesions EYES: conjunctiva are pink and non-injected, sclera anicteric OROPHARYNX: MMM, no exudates, no oropharyngeal erythema or ulceration NECK: supple, no JVD LYMPH:  no palpable lymphadenopathy in the cervical, axillary or inguinal  regions LUNGS: clear to auscultation b/l with normal respiratory effort HEART: regular rate & rhythm ABDOMEN:  normoactive bowel sounds , non tender, not distended. Extremity: no pedal edema PSYCH: alert & oriented x 3 with fluent speech NEURO: no focal motor/sensory deficits    LABORATORY DATA:  I have reviewed the data as listed  .    Latest Ref Rng & Units 03/03/2023   10:20 AM 01/02/2023   12:18 PM 12/06/2022    9:38 AM  CBC  WBC 3.4 - 10.8 x10E3/uL 7.5  10.0  6.8   Hemoglobin 13.0 - 17.7 g/dL 62.9  52.8  41.3   Hematocrit 37.5 - 51.0 % 47.2  47.2  43.7   Platelets 150 - 450 x10E3/uL 164  183.0  168    .    Latest Ref Rng & Units 03/03/2023   10:20 AM 01/02/2023   12:18 PM 12/06/2022    9:38 AM  CMP  Glucose 70 - 99 mg/dL 92  87  98   BUN 8 - 27 mg/dL 14  15  12    Creatinine 0.76 - 1.27 mg/dL 2.44  0.10  2.72   Sodium 134 - 144 mmol/L 140  137  136   Potassium 3.5 - 5.2 mmol/L 4.2  4.1  3.7   Chloride 96 - 106 mmol/L 104  102  106   CO2 20 - 29 mmol/L 22  28  22    Calcium 8.6 - 10.2 mg/dL 9.2  9.6  8.9   Total Protein 6.0 - 8.5 g/dL 6.8  7.6  7.4   Total Bilirubin 0.0 - 1.2 mg/dL 0.5  0.6  0.9   Alkaline Phos 44 - 121 IU/L 78  69  58   AST 0 - 40 IU/L 15  12  17    ALT 0 - 44 IU/L 16  3  21     . Lab Results  Component Value Date   LDH 122 08/20/2022  '  RADIOGRAPHIC STUDIES: I have personally reviewed the radiological images as listed and agreed with the findings in the report. No results found.  CT chest wo contrast ([id number]) done 11/20/2021 revealed "1. Pulmonary nodules bilaterally, similar to the prior study, most concerning of which is a mixed solid and sub solid lesion in the posterior right upper lobe measuring 1.4 x 1.2 cm with 9 mm solid component. 2. Aortic atherosclerosis, in addition to left main and 2 vessel coronary artery disease. Assessment for potential risk factor modification, dietary therapy or pharmacologic therapy may be warranted, if  clinically indicated. 3. Hepatic steatosis. 4. Additional incidental findings, as above. Aortic Atherosclerosis (ICD10-I70.0)."  ASSESSMENT & PLAN:   72 y.o. very pleasant male with   1. 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  PLAN: -Discussed lab results from today, 05/11/2023, in detail with the patient. CBC shows slightly decreased platelets at 133 K, but stable overall. CMP is pending.  -Discussed the option of PET scan, if insurance allows, before our next visit. Patient agrees.  -Schedule patient for PET scan before our next visit in 6 months.  -Recommend influenza vaccine, COVID-19 Booster, RSV vaccine, and other age related vaccines.  -patient has no clinical symptoms of lymphoma progression  FOLLOW-UP: PET/CT  in 22 weeks RTC with Dr Candise Che with labs in 6 months   The total time spent in the appointment was 20 minutes* .  All of the patient's questions were answered with apparent satisfaction. The patient knows to call the clinic with any problems, questions or concerns.   Wyvonnia Lora MD MS AAHIVMS [facility] Hematology/Oncology Physician [facility]  .*Total Encounter Time as defined by the Centers for Medicare and Medicaid Services includes, in addition to the face-to-face time of a patient visit (documented in the note above) non-face-to-face time: obtaining and reviewing outside history, ordering and reviewing medications, tests or procedures, care coordination (communications with other health care professionals or caregivers) and documentation in the medical record.   I,Param Shah,acting as a Neurosurgeon for Wyvonnia Lora, MD.,have documented all relevant documentation on the behalf of Wyvonnia Lora, MD,as directed by  Wyvonnia Lora, MD while in the presence of Wyvonnia Lora, MD.   .I have reviewed the above documentation for accuracy and completeness, and I agree with the above. Johney Maine MD

## 2023-05-14 ENCOUNTER — Telehealth: Payer: Self-pay | Admitting: Hematology

## 2023-05-14 NOTE — Telephone Encounter (Signed)
Patient is aware of scheduled appointment times/dates

## 2023-05-17 DIAGNOSIS — C8580 Other specified types of non-Hodgkin lymphoma, unspecified site: Secondary | ICD-10-CM | POA: Insufficient documentation

## 2023-06-02 ENCOUNTER — Telehealth: Payer: Self-pay | Admitting: Family Medicine

## 2023-06-08 ENCOUNTER — Telehealth: Payer: Self-pay | Admitting: Hematology

## 2023-06-08 NOTE — Progress Notes (Signed)
Pt contacted and stated [facility] pt will transfer care back to the [place]. [facility] now [facility] that can take [facility] his care.Pt will call us if needed.

## 2023-06-08 NOTE — Telephone Encounter (Signed)
Patient called in to cancel appt did not want to reschedule

## 2023-06-18 ENCOUNTER — Ambulatory Visit: Payer: Federal, State, Local not specified - PPO | Admitting: Family Medicine

## 2023-07-06 DIAGNOSIS — N401 Enlarged prostate with lower urinary tract symptoms: Secondary | ICD-10-CM | POA: Diagnosis not present

## 2023-07-06 DIAGNOSIS — R3915 Urgency of urination: Secondary | ICD-10-CM | POA: Diagnosis not present

## 2023-07-08 ENCOUNTER — Ambulatory Visit (INDEPENDENT_AMBULATORY_CARE_PROVIDER_SITE_OTHER): Payer: No Typology Code available for payment source | Admitting: Family Medicine

## 2023-07-08 ENCOUNTER — Encounter: Payer: Self-pay | Admitting: Family Medicine

## 2023-07-08 VITALS — BP 132/85 | HR 86 | Temp 97.6°F | Resp 18 | Ht 75.0 in | Wt 270.8 lb

## 2023-07-08 DIAGNOSIS — Z0000 Encounter for general adult medical examination without abnormal findings: Secondary | ICD-10-CM

## 2023-07-08 DIAGNOSIS — Z130 Encounter for screening for diseases of the blood and blood-forming organs and certain disorders involving the immune mechanism: Secondary | ICD-10-CM | POA: Diagnosis not present

## 2023-07-08 DIAGNOSIS — Z13220 Encounter for screening for lipoid disorders: Secondary | ICD-10-CM | POA: Diagnosis not present

## 2023-07-09 LAB — LIPID PANEL
Chol/HDL Ratio: 4.2 {ratio} (ref 0.0–5.0)
Cholesterol, Total: 178 mg/dL (ref 100–199)
HDL: 42 mg/dL (ref >39)
LDL Chol Calc (NIH): 115 mg/dL — ABNORMAL HIGH (ref 0–99)
Triglycerides: 116 mg/dL (ref 0–149)
VLDL Cholesterol Cal: 21 mg/dL (ref 5–40)

## 2023-07-09 LAB — CBC WITH DIFFERENTIAL/PLATELET
Basophils Absolute: 0.1 10*3/uL (ref 0.0–0.2)
Basos: 1 %
EOS (ABSOLUTE): 0.4 10*3/uL (ref 0.0–0.4)
Eos: 5 %
Hematocrit: 48.6 % (ref 37.5–51.0)
Hemoglobin: 16.3 g/dL (ref 13.0–17.7)
Immature Grans (Abs): 0 10*3/uL (ref 0.0–0.1)
Immature Granulocytes: 1 %
Lymphocytes Absolute: 1.7 10*3/uL (ref 0.7–3.1)
Lymphs: 22 %
MCH: 30.8 pg (ref 26.6–33.0)
MCHC: 33.5 g/dL (ref 31.5–35.7)
MCV: 92 fL (ref 79–97)
Monocytes Absolute: 0.7 10*3/uL (ref 0.1–0.9)
Monocytes: 9 %
Neutrophils Absolute: 5 10*3/uL (ref 1.4–7.0)
Neutrophils: 62 %
Platelets: 148 10*3/uL — ABNORMAL LOW (ref 150–450)
RBC: 5.29 x10E6/uL (ref 4.14–5.80)
RDW: 13 % (ref 11.6–15.4)
WBC: 7.9 10*3/uL (ref 3.4–10.8)

## 2023-07-09 LAB — CMP14+EGFR
ALT: 25 [IU]/L (ref 0–44)
AST: 19 [IU]/L (ref 0–40)
Albumin: 4.1 g/dL (ref 3.8–4.8)
Alkaline Phosphatase: 85 [IU]/L (ref 44–121)
BUN/Creatinine Ratio: 15 (ref 10–24)
BUN: 15 mg/dL (ref 8–27)
Bilirubin Total: 0.4 mg/dL (ref 0.0–1.2)
CO2: 20 mmol/L (ref 20–29)
Calcium: 9.2 mg/dL (ref 8.6–10.2)
Chloride: 105 mmol/L (ref 96–106)
Creatinine, Ser: 1.01 mg/dL (ref 0.76–1.27)
Globulin, Total: 2.7 g/dL (ref 1.5–4.5)
Glucose: 80 mg/dL (ref 70–99)
Potassium: 5 mmol/L (ref 3.5–5.2)
Sodium: 140 mmol/L (ref 134–144)
Total Protein: 6.8 g/dL (ref 6.0–8.5)
eGFR: 79 mL/min/{1.73_m2} (ref >59)

## 2023-07-14 ENCOUNTER — Encounter: Payer: Self-pay | Admitting: Family Medicine

## 2023-07-14 NOTE — Progress Notes (Signed)
Established Patient Office Visit  Subjective    Patient ID: Chad Knapp, male    DOB: 1951-05-07  Age: 73 y.o. MRN: [id number]  CC: No chief complaint on file.   HPI Chad Knapp presents for routine annual exam.. Patient denies acute complaints or concerns. He does relate that although he has had a recent  physical in the last part of 2024, he is being required to have one by his new insurance company.   Outpatient Encounter Medications as of 07/08/2023  Medication Sig   acetaminophen (TYLENOL) 500 MG tablet Take 1,000 mg by mouth every 6 (six) hours as needed for moderate pain.   albuterol (PROVENTIL HFA;VENTOLIN HFA) 108 (90 BASE) MCG/ACT inhaler Inhale 2 puffs into the lungs every 4 (four) hours as needed for wheezing or shortness of breath.   camphor-menthol (SARNA) lotion Apply 1 Application topically as needed for itching.   carbidopa-levodopa (SINEMET IR) 25-100 MG tablet Take 2.5 tablets by mouth 3 (three) times daily.   famotidine (PEPCID) 40 MG tablet TAKE ONE TABLET BY MOUTH DAILY FOR ACID REFLUX - REPLACES PANTOPRAZOLE   fluticasone (FLONASE) 50 MCG/ACT nasal spray Place 2 sprays into the nose daily as needed for rhinitis or allergies.   gabapentin (NEURONTIN) 100 MG capsule Take 200 mg by mouth 3 (three) times daily.   ibuprofen (ADVIL) 800 MG tablet Take 800 mg by mouth every 8 (eight) hours as needed for moderate pain.   Lactobacillus (ACIDOPHILUS/BIFIDUS PO) Take 2 tablets by mouth daily.   loratadine (ALLERGY) 10 MG tablet Take 10 mg by mouth daily. [employer] brand   losartan (COZAAR) 100 MG tablet Take 50 mg by mouth daily.   methocarbamol (ROBAXIN) 500 MG tablet Take 500 mg by mouth every 8 (eight) hours as needed for muscle spasms.   Multiple Vitamin (MULTIVITAMIN WITH MINERALS) TABS tablet Take 1 tablet by mouth daily.   ofloxacin (OCUFLOX) 0.3 % ophthalmic solution Place 1 drop into the left eye 4 (four) times daily.   Omega-3 Fatty Acids (OMEGA 3 PO) Take  600 mg by mouth daily.   oxyCODONE (ROXICODONE) 5 MG immediate release tablet Take 1 tablet (5 mg total) by mouth every 6 (six) hours as needed for severe pain.   pantoprazole (PROTONIX) 40 MG tablet Take 40 mg by mouth daily.   propranolol (INDERAL) 20 MG tablet Take 20 mg by mouth 3 (three) times daily.   rasagiline (AZILECT) 1 MG TABS tablet TAKE ONE TABLET BY MOUTH ONCE A DAY TO SLOW PARKINSON'S DISEASE - NOT A DOSE CHANGE   tamsulosin (FLOMAX) 0.4 MG CAPS capsule Take 1 capsule (0.4 mg total) by mouth daily.   Tiotropium Bromide-Olodaterol (STIOLTO RESPIMAT) 2.5-2.5 MCG/ACT AERS Inhale 2 puffs into the lungs daily.   Tiotropium Bromide-Olodaterol (STIOLTO RESPIMAT) 2.5-2.5 MCG/ACT AERS Inhale 2 puffs into the lungs daily.   venlafaxine XR (EFFEXOR-XR) 37.5 MG 24 hr capsule Take 37.5 mg by mouth daily.   No facility-administered encounter medications on file as of 07/08/2023.    Past Medical History:  Diagnosis Date   Anxiety    Appendicitis    Arthritis    Cancer (HCC)    basal cell -removed - back, shoulder and chest, squamous cell on nose - removed   COVID 12/2021   no symptoms per pt.   Depression    GERD (gastroesophageal reflux disease)    History of kidney stones    Hx of transfusion of packed red blood cells 06/23/1972   Hypertension  Non Hodgkin's lymphoma (HCC)    Pneumonia    Shortness of breath    Sleep apnea    cpap    Past Surgical History:  Procedure Laterality Date   ANTERIOR CERVICAL DECOMP/DISCECTOMY FUSION N/A 02/03/2013   Procedure: ACDF C6 - 7     1 LEVEL;  Surgeon: Venita Lick, MD;  Location: [facility];  Service: Orthopedics;  Laterality: N/A;   APPENDECTOMY     BASAL CELL CARCINOMA EXCISION     BRONCHIAL BIOPSY  01/07/2022   Procedure: BRONCHIAL BIOPSIES;  Surgeon: Josephine Igo, DO;  Location: [facility];  Service: Pulmonary;;   BRONCHIAL NEEDLE ASPIRATION BIOPSY  01/07/2022   Procedure: BRONCHIAL NEEDLE ASPIRATION BIOPSIES;  Surgeon: Josephine Igo, DO;  Location: [facility];  Service: Pulmonary;;   CERVICAL FUSION  02/03/2013   Dr Shon Baton   FRACTURE SURGERY Left    tib/fib/femur   TESTICLE REMOVAL Left    TONSILLECTOMY      Family History  Problem Relation Age of Onset   Mental illness Mother    Dementia Mother    Other Father        heart attack or stroke   Melanoma Brother     Social History   Socioeconomic History   Marital status: Married    Spouse name: Not on file   Number of children: Not on file   Years of education: Not on file   Highest education level: Not on file  Occupational History   Occupation: retired  Tobacco Use   Smoking status: Former    Current packs/day: 0.00    Average packs/day: 0.5 packs/day for 37.0 years (18.5 ttl pk-yrs)    Types: Cigarettes    Start date: 81    Quit date: 06/20/2007    Years since quitting: 16.0   Smokeless tobacco: Never  Vaping Use   Vaping status: Never Used  Substance and Sexual Activity   Alcohol use: Yes    Comment: maybe 2 beers a week   Drug use: Not Currently    Types: Marijuana    Comment: 01/06/22 - none for 6 months   Sexual activity: Yes  Other Topics Concern   Not on file  Social History Narrative   2 adopted children from [place], son and daughter   Social Drivers of Corporate investment banker Strain: Low Risk  (03/03/2023)   Overall Financial Resource Strain (CARDIA)    Difficulty of Paying Living Expenses: Not hard at all  Food Insecurity: No Food Insecurity (03/03/2023)   Hunger Vital Sign    Worried About Running Out of Food in the Last Year: Never true    Ran Out of Food in the Last Year: Never true  Transportation Needs: No Transportation Needs (03/03/2023)   PRAPARE - Administrator, Civil Service (Medical): No    Lack of Transportation (Non-Medical): No  Physical Activity: Insufficiently Active (03/03/2023)   Exercise Vital Sign    Days of Exercise per Week: 4 days    Minutes of Exercise per Session: 30  min  Stress: No Stress Concern Present (03/03/2023)   [employer] of Occupational Health - Occupational Stress Questionnaire    Feeling of Stress : Not at all  Social Connections: Socially Integrated (03/03/2023)   Social Connection and Isolation Panel [NHANES]    Frequency of Communication with Friends and Family: More than three times a week    Frequency of Social Gatherings with Friends and Family: More than three times  a week    Attends Religious Services: More than 4 times per year    Active Member of Clubs or Organizations: Yes    Attends Banker Meetings: More than 4 times per year    Marital Status: Married  Catering manager Violence: Not At Risk (03/03/2023)   Humiliation, Afraid, Rape, and Kick questionnaire    Fear of Current or Ex-Partner: No    Emotionally Abused: No    Physically Abused: No    Sexually Abused: No    Review of Systems  All other systems reviewed and are negative.       Objective    BP 132/85 (BP Location: Right Arm, Patient Position: Sitting, Cuff Size: Large)   Pulse 86   Temp 97.6 F (36.4 C) (Oral)   Resp 18   Ht 6\' 3"  (1.905 m)   Wt 270 lb 12.8 oz (122.8 kg)   SpO2 94%   BMI 33.85 kg/m   Physical Exam Vitals and nursing note reviewed.  Constitutional:      General: He is not in acute distress. HENT:     Head: Normocephalic and atraumatic.     Right Ear: Tympanic membrane, ear canal and external ear normal.     Left Ear: Tympanic membrane, ear canal and external ear normal.     Nose: Nose normal.     Mouth/Throat:     Mouth: Mucous membranes are moist.     Pharynx: Oropharynx is clear.  Eyes:     Conjunctiva/sclera: Conjunctivae normal.     Pupils: Pupils are equal, round, and reactive to light.  Neck:     Thyroid: No thyromegaly.  Cardiovascular:     Rate and Rhythm: Normal rate and regular rhythm.     Heart sounds: Normal heart sounds. No murmur heard. Pulmonary:     Effort: Pulmonary effort is normal.      Breath sounds: Normal breath sounds.  Abdominal:     General: There is no distension.     Palpations: Abdomen is soft. There is no mass.     Tenderness: There is no abdominal tenderness.     Hernia: There is no hernia in the left inguinal area or right inguinal area.  Musculoskeletal:        General: Normal range of motion.     Cervical back: Normal range of motion and neck supple.     Right lower leg: No edema.     Left lower leg: No edema.  Skin:    General: Skin is warm and dry.  Neurological:     General: No focal deficit present.     Mental Status: He is alert and oriented to person, place, and time. Mental status is at baseline.  Psychiatric:        Mood and Affect: Mood normal.        Behavior: Behavior normal.         Assessment & Plan:   Annual physical exam -     CMP14+EGFR  Screening for deficiency anemia -     CBC with Differential/Platelet  Screening for lipid disorders -     Lipid panel     No follow-ups on file.   Tommie Raymond, MD

## 2023-08-17 DIAGNOSIS — N401 Enlarged prostate with lower urinary tract symptoms: Secondary | ICD-10-CM | POA: Diagnosis not present

## 2023-08-17 DIAGNOSIS — N3941 Urge incontinence: Secondary | ICD-10-CM | POA: Diagnosis not present

## 2023-08-27 DIAGNOSIS — E669 Obesity, unspecified: Secondary | ICD-10-CM | POA: Diagnosis not present

## 2023-08-27 DIAGNOSIS — R2681 Unsteadiness on feet: Secondary | ICD-10-CM | POA: Diagnosis not present

## 2023-08-27 DIAGNOSIS — G20A1 Parkinson's disease without dyskinesia, without mention of fluctuations: Secondary | ICD-10-CM | POA: Diagnosis not present

## 2023-08-27 DIAGNOSIS — I129 Hypertensive chronic kidney disease with stage 1 through stage 4 chronic kidney disease, or unspecified chronic kidney disease: Secondary | ICD-10-CM | POA: Diagnosis not present

## 2023-08-27 DIAGNOSIS — N1831 Chronic kidney disease, stage 3a: Secondary | ICD-10-CM | POA: Diagnosis not present

## 2023-08-27 DIAGNOSIS — Z008 Encounter for other general examination: Secondary | ICD-10-CM | POA: Diagnosis not present

## 2023-08-27 DIAGNOSIS — F3342 Major depressive disorder, recurrent, in full remission: Secondary | ICD-10-CM | POA: Diagnosis not present

## 2023-08-27 DIAGNOSIS — F17211 Nicotine dependence, cigarettes, in remission: Secondary | ICD-10-CM | POA: Diagnosis not present

## 2023-08-27 DIAGNOSIS — Z6833 Body mass index (BMI) 33.0-33.9, adult: Secondary | ICD-10-CM | POA: Diagnosis not present

## 2023-08-27 DIAGNOSIS — C884 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: Secondary | ICD-10-CM | POA: Diagnosis not present

## 2023-08-27 DIAGNOSIS — J4489 Other specified chronic obstructive pulmonary disease: Secondary | ICD-10-CM | POA: Diagnosis not present

## 2023-11-11 ENCOUNTER — Ambulatory Visit: Payer: Federal, State, Local not specified - PPO | Admitting: Hematology

## 2023-11-11 ENCOUNTER — Other Ambulatory Visit: Payer: Federal, State, Local not specified - PPO

## 2024-01-13 ENCOUNTER — Ambulatory Visit: Admission: EM | Admit: 2024-01-13 | Discharge: 2024-01-13 | Disposition: A | Discharge status: Home / Self Care

## 2024-01-13 ENCOUNTER — Ambulatory Visit: Payer: Self-pay

## 2024-01-13 DIAGNOSIS — M1610 Unilateral primary osteoarthritis, unspecified hip: Secondary | ICD-10-CM | POA: Insufficient documentation

## 2024-01-13 DIAGNOSIS — J40 Bronchitis, not specified as acute or chronic: Secondary | ICD-10-CM | POA: Insufficient documentation

## 2024-01-13 DIAGNOSIS — D485 Neoplasm of uncertain behavior of skin: Secondary | ICD-10-CM | POA: Insufficient documentation

## 2024-01-13 DIAGNOSIS — M19172 Post-traumatic osteoarthritis, left ankle and foot: Secondary | ICD-10-CM | POA: Insufficient documentation

## 2024-01-13 DIAGNOSIS — G20A2 Parkinson's disease without dyskinesia, with fluctuations: Secondary | ICD-10-CM | POA: Insufficient documentation

## 2024-01-13 DIAGNOSIS — C8390 Non-follicular (diffuse) lymphoma, unspecified, unspecified site: Secondary | ICD-10-CM | POA: Insufficient documentation

## 2024-01-13 DIAGNOSIS — Z23 Encounter for immunization: Secondary | ICD-10-CM | POA: Insufficient documentation

## 2024-01-13 DIAGNOSIS — G4730 Sleep apnea, unspecified: Secondary | ICD-10-CM | POA: Insufficient documentation

## 2024-01-13 DIAGNOSIS — Z461 Encounter for fitting and adjustment of hearing aid: Secondary | ICD-10-CM | POA: Insufficient documentation

## 2024-01-13 DIAGNOSIS — M7740 Metatarsalgia, unspecified foot: Secondary | ICD-10-CM | POA: Insufficient documentation

## 2024-01-13 DIAGNOSIS — D3130 Benign neoplasm of unspecified choroid: Secondary | ICD-10-CM | POA: Insufficient documentation

## 2024-01-13 DIAGNOSIS — E7800 Pure hypercholesterolemia, unspecified: Secondary | ICD-10-CM | POA: Insufficient documentation

## 2024-01-13 DIAGNOSIS — D3132 Benign neoplasm of left choroid: Secondary | ICD-10-CM | POA: Insufficient documentation

## 2024-01-13 DIAGNOSIS — B079 Viral wart, unspecified: Secondary | ICD-10-CM | POA: Insufficient documentation

## 2024-01-13 DIAGNOSIS — D126 Benign neoplasm of colon, unspecified: Secondary | ICD-10-CM | POA: Insufficient documentation

## 2024-01-13 DIAGNOSIS — R918 Other nonspecific abnormal finding of lung field: Secondary | ICD-10-CM | POA: Insufficient documentation

## 2024-01-13 DIAGNOSIS — D333 Benign neoplasm of cranial nerves: Secondary | ICD-10-CM | POA: Insufficient documentation

## 2024-01-13 DIAGNOSIS — R4189 Other symptoms and signs involving cognitive functions and awareness: Secondary | ICD-10-CM | POA: Insufficient documentation

## 2024-01-13 DIAGNOSIS — F17200 Nicotine dependence, unspecified, uncomplicated: Secondary | ICD-10-CM | POA: Insufficient documentation

## 2024-01-13 DIAGNOSIS — F331 Major depressive disorder, recurrent, moderate: Secondary | ICD-10-CM | POA: Insufficient documentation

## 2024-01-13 DIAGNOSIS — R7303 Prediabetes: Secondary | ICD-10-CM | POA: Insufficient documentation

## 2024-01-13 DIAGNOSIS — Z4889 Encounter for other specified surgical aftercare: Secondary | ICD-10-CM | POA: Insufficient documentation

## 2024-01-13 DIAGNOSIS — C884 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: Secondary | ICD-10-CM | POA: Insufficient documentation

## 2024-01-13 DIAGNOSIS — J069 Acute upper respiratory infection, unspecified: Secondary | ICD-10-CM | POA: Diagnosis not present

## 2024-01-13 DIAGNOSIS — Z20822 Contact with and (suspected) exposure to covid-19: Secondary | ICD-10-CM

## 2024-01-13 DIAGNOSIS — R400 Somnolence: Secondary | ICD-10-CM | POA: Insufficient documentation

## 2024-01-13 DIAGNOSIS — C4490 Unspecified malignant neoplasm of skin, unspecified: Secondary | ICD-10-CM | POA: Insufficient documentation

## 2024-01-13 DIAGNOSIS — M51379 Other intervertebral disc degeneration, lumbosacral region without mention of lumbar back pain or lower extremity pain: Secondary | ICD-10-CM | POA: Insufficient documentation

## 2024-01-13 DIAGNOSIS — F419 Anxiety disorder, unspecified: Secondary | ICD-10-CM | POA: Insufficient documentation

## 2024-01-13 DIAGNOSIS — C4491 Basal cell carcinoma of skin, unspecified: Secondary | ICD-10-CM | POA: Insufficient documentation

## 2024-01-13 DIAGNOSIS — L719 Rosacea, unspecified: Secondary | ICD-10-CM | POA: Insufficient documentation

## 2024-01-13 DIAGNOSIS — M25579 Pain in unspecified ankle and joints of unspecified foot: Secondary | ICD-10-CM | POA: Insufficient documentation

## 2024-01-13 DIAGNOSIS — Z9181 History of falling: Secondary | ICD-10-CM | POA: Insufficient documentation

## 2024-01-13 DIAGNOSIS — Z4802 Encounter for removal of sutures: Secondary | ICD-10-CM | POA: Insufficient documentation

## 2024-01-13 DIAGNOSIS — H9191 Unspecified hearing loss, right ear: Secondary | ICD-10-CM | POA: Insufficient documentation

## 2024-01-13 DIAGNOSIS — Z029 Encounter for administrative examinations, unspecified: Secondary | ICD-10-CM | POA: Insufficient documentation

## 2024-01-13 DIAGNOSIS — Q667 Congenital pes cavus, unspecified foot: Secondary | ICD-10-CM | POA: Insufficient documentation

## 2024-01-13 DIAGNOSIS — S82109A Unspecified fracture of upper end of unspecified tibia, initial encounter for closed fracture: Secondary | ICD-10-CM | POA: Insufficient documentation

## 2024-01-13 DIAGNOSIS — Z0101 Encounter for examination of eyes and vision with abnormal findings: Secondary | ICD-10-CM | POA: Insufficient documentation

## 2024-01-13 DIAGNOSIS — M25569 Pain in unspecified knee: Secondary | ICD-10-CM | POA: Insufficient documentation

## 2024-01-13 DIAGNOSIS — K5900 Constipation, unspecified: Secondary | ICD-10-CM | POA: Insufficient documentation

## 2024-01-13 DIAGNOSIS — H2510 Age-related nuclear cataract, unspecified eye: Secondary | ICD-10-CM | POA: Insufficient documentation

## 2024-01-13 DIAGNOSIS — F3341 Major depressive disorder, recurrent, in partial remission: Secondary | ICD-10-CM | POA: Insufficient documentation

## 2024-01-13 DIAGNOSIS — R0600 Dyspnea, unspecified: Secondary | ICD-10-CM | POA: Insufficient documentation

## 2024-01-13 DIAGNOSIS — E669 Obesity, unspecified: Secondary | ICD-10-CM | POA: Insufficient documentation

## 2024-01-13 DIAGNOSIS — C44319 Basal cell carcinoma of skin of other parts of face: Secondary | ICD-10-CM | POA: Insufficient documentation

## 2024-01-13 DIAGNOSIS — L603 Nail dystrophy: Secondary | ICD-10-CM | POA: Insufficient documentation

## 2024-01-13 DIAGNOSIS — M545 Low back pain, unspecified: Secondary | ICD-10-CM | POA: Insufficient documentation

## 2024-01-13 DIAGNOSIS — L84 Corns and callosities: Secondary | ICD-10-CM | POA: Insufficient documentation

## 2024-01-13 DIAGNOSIS — M542 Cervicalgia: Secondary | ICD-10-CM | POA: Insufficient documentation

## 2024-01-13 DIAGNOSIS — M170 Bilateral primary osteoarthritis of knee: Secondary | ICD-10-CM | POA: Insufficient documentation

## 2024-01-13 DIAGNOSIS — Q828 Other specified congenital malformations of skin: Secondary | ICD-10-CM | POA: Insufficient documentation

## 2024-01-13 DIAGNOSIS — L570 Actinic keratosis: Secondary | ICD-10-CM | POA: Insufficient documentation

## 2024-01-13 DIAGNOSIS — G20C Parkinsonism, unspecified: Secondary | ICD-10-CM | POA: Insufficient documentation

## 2024-01-13 HISTORY — DX: Malignant neoplasm of unspecified part of unspecified bronchus or lung: C34.90

## 2024-01-13 HISTORY — DX: Unspecified malignant neoplasm of skin, unspecified: C44.90

## 2024-01-13 LAB — POC SARS CORONAVIRUS 2 AG -  ED: SARS Coronavirus 2 Ag: NEGATIVE

## 2024-01-13 NOTE — ED Triage Notes (Signed)
 My wife woke up Saturday (same symptoms) with + @ home COVID19 test, called her PCP on Monday (did Telehealth and prescribed Paxlovid ), that same day we started staying apart in house to confine her diagnosis of COVID19, starting Tuesday my cough started, during night a more productive cough with runny nose (some congestion), no ha. No fever known.

## 2024-01-13 NOTE — ED Provider Notes (Signed)
 [name]    CSN: [id number] Arrival date & time: 01/13/24  1133      History   Chief Complaint Chief Complaint  Patient presents with   Cough   Nasal Congestion    HPI Chad Knapp is a 73 y.o. male.   Patient here today for evaluation of cough, congestion, and runny nose that started yesterday.  He has not had fever or headache.  He has had COVID exposure.  The history is provided by the patient.  Cough Associated symptoms: rhinorrhea   Associated symptoms: no chills, no ear pain, no eye discharge, no fever, no shortness of breath and no sore throat     Past Medical History:  Diagnosis Date   Anxiety 2015   Appendicitis    Arthritis 2000's   Cancer (HCC)    basal cell -removed - back, shoulder and chest, squamous cell on nose - removed   COVID 12/2021   no symptoms per pt.   Depression    GERD (gastroesophageal reflux disease) 2010   History of kidney stones    Hx of transfusion of packed red blood cells 06/23/1972   Hypertension    Lung cancer (HCC) 2023   Non Hodgkin's lymphoma (HCC)    Pneumonia    Shortness of breath    Skin cancer 1990's   Sleep apnea    cpap    Patient Active Problem List   Diagnosis Date Noted   Corn of foot 01/13/2024   Dyspnea 01/13/2024   Actinic keratosis 01/13/2024   Acute upper respiratory infection, unspecified 01/13/2024   Anxiety disorder, unspecified 01/13/2024   Anxiety 01/13/2024   At risk for falls 01/13/2024   Basal cell carcinoma of skin 01/13/2024   Benign neoplasm of colon, unspecified 01/13/2024   Basal cell carcinoma of skin of other parts of face 01/13/2024   Benign neoplasm of cranial nerves (HCC) 01/13/2024   Benign neoplasm of left choroid 01/13/2024   Benign neoplasm of choroid 01/13/2024   Bilateral primary osteoarthritis of knee 01/13/2024   Constipation, unspecified 01/13/2024   Closed fracture of upper end of fibula with tibia 01/13/2024   Degeneration of lumbar or lumbosacral  intervertebral disc 01/13/2024   Encounter for examination of eyes and vision with abnormal findings 01/13/2024   Encounter for fitting and adjustment of hearing aid 01/13/2024   Dystrophia unguium 01/13/2024   Encounter for administrative examinations, unspecified 01/13/2024   Encounter for immunization 01/13/2024   Encounter for removal of sutures 01/13/2024   Encounter for other specified surgical aftercare 01/13/2024   Hip joint inflamed 01/13/2024   Major depressive disorder, recurrent, 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/2024   Low back pain 01/13/2024   Major depressive disorder, recurrent, moderate (HCC) 01/13/2024   Malignant neoplasm of skin 01/13/2024   Neck pain 01/13/2024   Metatarsalgia 01/13/2024   Neoplasm of uncertain behavior of skin 01/13/2024   Non-follicular (diffuse) lymphoma, unspecified, unspecified site (HCC) 01/13/2024   Nuclear senile cataract 01/13/2024   Other symptoms and signs involving cognitive functions and awareness 01/13/2024   Obesity 01/13/2024   Pain in joint involving ankle and foot 01/13/2024   Pain in joint, lower leg 01/13/2024   Parkinson's disease without dyskinesia, with fluctuations (HCC) 01/13/2024   Parkinsonism, unspecified (HCC) 01/13/2024   Post-traumatic osteoarthritis, left ankle and foot 01/13/2024   Prediabetes 01/13/2024   Pure hypercholesterolemia 01/13/2024   Rosacea 01/13/2024   Sleep apnea 01/13/2024   Verruca 01/13/2024   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  Bronchitis 01/13/2024   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 01/13/2024   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/13/2024   Tobacco dependence syndrome 01/13/2024   Pes cavus 01/13/2024   Multiple nodules of lung 01/13/2024   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/13/2024   Hearing loss in right ear 01/13/2024   Intermittent drowsiness 01/13/2024    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   Lung nodule 12/20/2021   Right upper lobe pulmonary nodule 09/18/2021   Essential hypertension 03/01/2021   Frank hematuria 03/01/2021   Depressive disorder 11/03/2016   Sensorineural hearing loss, 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   Internal hemorrhoids 07/15/2007   Allergic rhinitis 07/15/2007   Gastritis and gastroduodenitis 07/15/2007   Duodenitis 07/15/2007   Diverticulosis of colon 07/15/2007   Arthropathy 07/15/2007   History of colonic polyps 07/15/2007    Past Surgical History:  Procedure Laterality Date   ANTERIOR CERVICAL DECOMP/DISCECTOMY FUSION N/A 02/03/2013   Procedure: ACDF C6 - 7     1 LEVEL;  Surgeon: Donaciano Sprang, MD;  Location: [facility] OR;  Service: Orthopedics;  Laterality: N/A;   APPENDECTOMY  1961   BASAL CELL CARCINOMA EXCISION     BRONCHIAL BIOPSY  01/07/2022   Procedure: BRONCHIAL BIOPSIES;  Surgeon: Brenna Adine CROME, DO;  Location: [facility];  Service: Pulmonary;;   BRONCHIAL NEEDLE ASPIRATION BIOPSY  01/07/2022   Procedure: BRONCHIAL NEEDLE ASPIRATION BIOPSIES;  Surgeon: Brenna Adine CROME, DO;  Location: [facility];  Service: Pulmonary;;   CERVICAL FUSION  02/03/2013   Dr Sprang   FRACTURE SURGERY Left 1974   tib/fib/femur   SPINE SURGERY  2014   TESTICLE REMOVAL Left    TONSILLECTOMY         Home Medications    Prior to Admission medications   Medication Sig Start Date End Date Taking? Authorizing Provider  carboxymethylcellulose (REFRESH PLUS) 0.5 % SOLN Place 1 drop into both eyes 4 (four) times daily. 11/02/23  Yes [provider]  mirabegron ER (MYRBETRIQ) 25 MG TB24 tablet Take 25 mg by mouth daily. 12/03/23  Yes [provider]  tamsulosin  (FLOMAX ) 0.4 MG CAPS capsule Take 1 capsule (0.4 mg total) by mouth daily. 12/10/22  Yes Lorren, Amy J, NP  acetaminophen  (TYLENOL ) 500 MG tablet Take 1,000 mg by mouth every 6 (six) hours as needed for moderate  pain.    [provider]  albuterol  (PROVENTIL  HFA;VENTOLIN  HFA) 108 (90 BASE) MCG/ACT inhaler Inhale 2 puffs into the lungs every 4 (four) hours as needed for wheezing or shortness of breath.    [provider]  ASPIRIN 81 PO Take 1 tablet by mouth daily.    [provider]  camphor-menthol  VIKKI) lotion Apply 1 Application topically as needed for itching.    [provider]  carbidopa-levodopa (SINEMET IR) 25-100 MG tablet Take 2.5 tablets by mouth 3 (three) times daily. 10/01/20   [provider]  famotidine (PEPCID) 40 MG tablet TAKE ONE TABLET BY MOUTH DAILY FOR ACID REFLUX - REPLACES PANTOPRAZOLE 02/04/22   [provider]  fluticasone  (FLONASE ) 50 MCG/ACT nasal spray Place 2 sprays into the nose daily as needed for rhinitis or allergies.    [provider]  gabapentin (NEURONTIN) 100 MG capsule Take 200 mg by mouth 3 (three) times daily.    [provider]  ibuprofen (ADVIL) 800 MG tablet Take 800 mg by mouth every 8 (eight) hours as needed for moderate pain. 08/23/21  [provider]  Lactobacillus (ACIDOPHILUS/BIFIDUS PO) Take 2 tablets by mouth daily.    [provider]  loratadine (ALLERGY) 10 MG tablet Take 10 mg by mouth daily. [employer] brand    [provider]  losartan  (COZAAR ) 100 MG tablet Take 50 mg by mouth daily. 10/31/20   [provider]  methocarbamol  (ROBAXIN ) 500 MG tablet Take 500 mg by mouth every 8 (eight) hours as needed for muscle spasms. 08/16/21   [provider]  Multiple Vitamin (MULTIVITAMIN WITH MINERALS) TABS tablet Take 1 tablet by mouth daily.    [provider]  Multiple Vitamins-Minerals (CENTRUM ADULTS PO) Take 1 tablet by mouth daily.    [provider]  ofloxacin  (OCUFLOX ) 0.3 % ophthalmic solution Place 1 drop into the left eye 4 (four) times daily. 02/14/22   Raspet, Erin K, PA-C  Omega-3 Fatty Acids (FISH OIL) 1000 MG CAPS  600 mg daily.    [provider]  Omega-3 Fatty Acids (OMEGA 3 PO) Take 600 mg by mouth daily.    [provider]  oxyCODONE  (ROXICODONE ) 5 MG immediate release tablet Take 1 tablet (5 mg total) by mouth every 6 (six) hours as needed for severe pain. 12/07/22   Dreama Longs, MD  pantoprazole (PROTONIX) 40 MG tablet Take 40 mg by mouth daily.    [provider]  propranolol (INDERAL) 20 MG tablet Take 20 mg by mouth 3 (three) times daily.    [provider]  Psyllium (METAMUCIL PO) Take 1 g every day by oral route.    [provider]  rasagiline (AZILECT) 1 MG TABS tablet TAKE ONE TABLET BY MOUTH ONCE A DAY TO SLOW PARKINSON'S DISEASE - NOT A DOSE CHANGE 10/29/21   [provider]  Spacer/Aero-Holding Chambers (OPTICHAMBER DIAMOND) DEVI See admin instructions.    [provider]  Tiotropium Bromide-Olodaterol (STIOLTO RESPIMAT ) 2.5-2.5 MCG/ACT AERS Inhale 2 puffs into the lungs daily. 10/03/21   Icard, Adine CROME, DO  Tiotropium Bromide-Olodaterol (STIOLTO RESPIMAT ) 2.5-2.5 MCG/ACT AERS Inhale 2 puffs into the lungs daily. 01/17/22   Ruthell Lauraine FALCON, NP  venlafaxine XR (EFFEXOR-XR) 37.5 MG 24 hr capsule Take 37.5 mg by mouth daily.    [provider]    Family History Family History  Problem Relation Age of Onset   Mental illness Mother    Dementia Mother    Other Father        heart attack or stroke   Melanoma Brother     Social History Social History   Tobacco Use   Smoking status: Former    Current packs/day: 0.00    Average packs/day: 0.5 packs/day for 37.0 years (18.5 ttl pk-yrs)    Types: Cigarettes    Start date: 26    Quit date: 06/20/2007    Years since quitting: 16.5   Smokeless tobacco: Never  Vaping Use   Vaping status: Never Used  Substance Use Topics   Alcohol use: Yes    Comment: maybe 2 beers a week   Drug use: Not Currently    Types: Marijuana    Comment: 01/06/22 - none for 6 months      Allergies   Lisinopril   Review of Systems Review of Systems  Constitutional:  Negative for chills and fever.  HENT:  Positive for congestion and rhinorrhea. Negative for ear pain and sore throat.   Eyes:  Negative for discharge and redness.  Respiratory:  Positive for cough. Negative for shortness of breath.  Gastrointestinal:  Negative for abdominal pain, nausea and vomiting.     Physical Exam Triage Vital Signs ED Triage Vitals  Encounter Vitals Group     BP 01/13/24 1155 112/76     Girls Systolic BP Percentile --      Girls Diastolic BP Percentile --      Boys Systolic BP Percentile --      Boys Diastolic BP Percentile --      Pulse Rate 01/13/24 1155 74     Resp 01/13/24 1155 20     Temp 01/13/24 1155 97.9 F (36.6 C)     Temp Source 01/13/24 1155 Temporal     SpO2 01/13/24 1155 97 %     Weight 01/13/24 1152 257 lb (116.6 kg)     Height 01/13/24 1152 6' 3 (1.905 m)     Head Circumference --      Peak Flow --      Pain Score 01/13/24 1149 0     Pain Loc --      Pain Education --      Exclude from Growth Chart --    No data found.  Updated Vital Signs BP 112/76 (BP Location: Left Arm)   Pulse 74   Temp 97.9 F (36.6 C) (Temporal)   Resp 20   Ht 6' 3 (1.905 m)   Wt 257 lb (116.6 kg)   SpO2 97%   BMI 32.12 kg/m   Visual Acuity Right Eye Distance:   Left Eye Distance:   Bilateral Distance:    Right Eye Near:   Left Eye Near:    Bilateral Near:     Physical Exam Vitals and nursing note reviewed.  Constitutional:      General: He is not in acute distress.    Appearance: Normal appearance. He is not ill-appearing.  HENT:     Head: Normocephalic and atraumatic.     Right Ear: Tympanic membrane normal.     Left Ear: Tympanic membrane normal.     Nose: Congestion present.     Mouth/Throat:     Mouth: Mucous membranes are moist.     Pharynx: Oropharynx is clear. No oropharyngeal exudate or posterior oropharyngeal erythema.  Eyes:      Conjunctiva/sclera: Conjunctivae normal.  Cardiovascular:     Rate and Rhythm: Normal rate and regular rhythm.     Heart sounds: Normal heart sounds. No murmur heard. Pulmonary:     Effort: Pulmonary effort is normal. No respiratory distress.     Breath sounds: Normal breath sounds. No wheezing, rhonchi or rales.  Skin:    General: Skin is warm and dry.  Neurological:     Mental Status: He is alert.  Psychiatric:        Mood and Affect: Mood normal.        Thought Content: Thought content normal.      UC Treatments / Results  Labs (all labs ordered are listed, but only abnormal results are displayed) Labs Reviewed  POC SARS CORONAVIRUS 2 AG -  ED    EKG   Radiology No results found.  Procedures Procedures (including critical care time)  Medications Ordered in UC Medications - No data to display  Initial Impression / Assessment and Plan / UC Course  I have reviewed the triage vital signs and the nursing notes.  Pertinent labs & imaging results that were available during my care of the patient were reviewed by me and considered in my medical decision making (see chart for  details).     *** Final Clinical Impressions(s) / UC Diagnoses   Final diagnoses:  None   Discharge Instructions   None    ED Prescriptions   None    PDMP not reviewed this encounter.

## 2024-01-13 NOTE — Telephone Encounter (Signed)
 FYI Only or Action Required?: Action required by provider: clinical question for provider.  Patient was last seen in primary care on 07/08/2023 by Tanda Bleacher, MD.  Called Nurse Triage reporting Covid Exposure.  Symptoms began yesterday.  Interventions attempted: Rest, hydration, or home remedies.  Symptoms are: gradually worsening.  Triage Disposition: See Physician Within 24 Hours  Patient/caregiver understands and will follow disposition?: Yes   Copied from CRM #[id number]. Topic: Clinical - Red Word Triage >> Jan 13, 2024 10:46 AM Larissa RAMAN wrote: [facility] that prompted transfer to Nurse Triage: exposure to Covid c/o headache, cough, congestion/nasal drainage  Reason for Disposition  [1] Continuous (nonstop) coughing interferes with work or school AND [2] no improvement using cough treatment per Care Advice  Answer Assessment - Initial Assessment Questions 1. COVID-19 EXPOSURE: Please describe how you were exposed to someone with a COVID-19 infection.     Patient's wife tested positive on Saturday with a Home Test.   2. PLACE of CONTACT: Where were you when you were exposed to COVID-19? (e.g., home, school, medical waiting room; which city?)     Home  3. TYPE of CONTACT: How much contact was there? (e.g., sitting next to, live in same house, work in same office, same building)     Lives in the same home  4. DURATION of CONTACT: How long were you in contact with the COVID-19 patient? (e.g., a few seconds, passed by person, a few minutes, 15 minutes or longer, live with the patient)     Lives with the COVID positive individual  5. DATE of CONTACT: When did you have contact with a COVID-19 patient? (e.g., hours, days ago)     Continuously since his wife started showing symptoms on Saturday  6. MASK: Were you wearing a mask? Was the other person wearing a mask? Note: wearing a mask reduces the risk of an otherwise close contact.     No  7. SYMPTOMS: Do you  have any symptoms? (e.g., fever, cough, breathing difficulty, loss of taste or smell)     Headache, Coughing, Rhinorrhea,  8. COVID-19 VACCINE: Have you had the COVID-19 vaccine? If Yes, ask: When did you last get it?     Yes, the patient has been vaccinated for COVID.   10. HIGH RISK: Do you have any heart or lung problems? (e.g., asthma, COPD, heart failure) Do you have a weak immune system or other risk factors? (e.g., HIV positive, chemotherapy, renal failure, diabetes mellitus, sickle cell anemia, obesity) Diagnosed 2 years ago with Non Hodgkins Lymphoma.  The patient has not tested for COVID at home.  Protocols used: COVID-19 - Exposure-A-AH, Cough - Acute Productive-A-AH

## 2024-01-14 ENCOUNTER — Ambulatory Visit: Admission: EM | Admit: 2024-01-14 | Discharge: 2024-01-14 | Discharge status: Against Medical Advice

## 2024-01-14 ENCOUNTER — Telehealth: Payer: Self-pay

## 2024-01-14 MED ORDER — PAXLOVID (300/100) 20 X 150 MG & 10 X 100MG PO TBPK: 3.0000 | ORAL_TABLET | Freq: Two times a day (BID) | ORAL | 0 refills | Status: DC

## 2024-01-14 NOTE — Telephone Encounter (Signed)
 Notified Patient. He say's 85% of my med list I no longer taken including Oxycodone  so I will get Dr. Tanda to update when possible.   WENDI Dixon CMA

## 2024-01-14 NOTE — Telephone Encounter (Signed)
 Patient came by clinic to be seen after + at home COVID19 test. Discussed with him (per provider) that since he tested at home and positive, we will send in Paxlovid .  Pharmacy: [facility] [address] [facility] [place]  Msg sent to [facility]  Redell HERO. CMA

## 2024-01-14 NOTE — Telephone Encounter (Signed)
 Paxlovid  prescribed. Recommend patient hold oxycodone  if he is still taking this.

## 2024-01-19 ENCOUNTER — Other Ambulatory Visit: Payer: Self-pay

## 2024-01-19 ENCOUNTER — Inpatient Hospital Stay (HOSPITAL_COMMUNITY)
Admission: EM | Admit: 2024-01-19 | Discharge: 2024-01-22 | DRG: 200 | Disposition: A | Discharge status: Home / Self Care | Source: Ambulatory Visit | Attending: Internal Medicine | Admitting: Internal Medicine

## 2024-01-19 ENCOUNTER — Encounter (HOSPITAL_COMMUNITY): Payer: Self-pay

## 2024-01-19 ENCOUNTER — Emergency Department (HOSPITAL_COMMUNITY)

## 2024-01-19 DIAGNOSIS — G20A2 Parkinson's disease without dyskinesia, with fluctuations: Secondary | ICD-10-CM

## 2024-01-19 DIAGNOSIS — Z6832 Body mass index (BMI) 32.0-32.9, adult: Secondary | ICD-10-CM

## 2024-01-19 DIAGNOSIS — Z981 Arthrodesis status: Secondary | ICD-10-CM

## 2024-01-19 DIAGNOSIS — E669 Obesity, unspecified: Secondary | ICD-10-CM | POA: Diagnosis present

## 2024-01-19 DIAGNOSIS — I10 Essential (primary) hypertension: Secondary | ICD-10-CM

## 2024-01-19 DIAGNOSIS — U071 COVID-19: Secondary | ICD-10-CM | POA: Diagnosis not present

## 2024-01-19 DIAGNOSIS — D696 Thrombocytopenia, unspecified: Secondary | ICD-10-CM | POA: Diagnosis not present

## 2024-01-19 DIAGNOSIS — C8580 Other specified types of non-Hodgkin lymphoma, unspecified site: Secondary | ICD-10-CM

## 2024-01-19 DIAGNOSIS — M1990 Unspecified osteoarthritis, unspecified site: Secondary | ICD-10-CM | POA: Diagnosis present

## 2024-01-19 DIAGNOSIS — J9383 Other pneumothorax: Principal | ICD-10-CM | POA: Diagnosis present

## 2024-01-19 DIAGNOSIS — G4733 Obstructive sleep apnea (adult) (pediatric): Secondary | ICD-10-CM | POA: Diagnosis not present

## 2024-01-19 DIAGNOSIS — Z85118 Personal history of other malignant neoplasm of bronchus and lung: Secondary | ICD-10-CM

## 2024-01-19 DIAGNOSIS — E66811 Obesity, class 1: Secondary | ICD-10-CM | POA: Diagnosis not present

## 2024-01-19 DIAGNOSIS — Z7982 Long term (current) use of aspirin: Secondary | ICD-10-CM

## 2024-01-19 DIAGNOSIS — G20A1 Parkinson's disease without dyskinesia, without mention of fluctuations: Secondary | ICD-10-CM | POA: Diagnosis present

## 2024-01-19 DIAGNOSIS — J189 Pneumonia, unspecified organism: Secondary | ICD-10-CM

## 2024-01-19 DIAGNOSIS — Z808 Family history of malignant neoplasm of other organs or systems: Secondary | ICD-10-CM

## 2024-01-19 DIAGNOSIS — Z888 Allergy status to other drugs, medicaments and biological substances status: Secondary | ICD-10-CM

## 2024-01-19 DIAGNOSIS — U099 Post covid-19 condition, unspecified: Secondary | ICD-10-CM | POA: Diagnosis present

## 2024-01-19 DIAGNOSIS — D6959 Other secondary thrombocytopenia: Secondary | ICD-10-CM | POA: Diagnosis present

## 2024-01-19 DIAGNOSIS — Z87891 Personal history of nicotine dependence: Secondary | ICD-10-CM

## 2024-01-19 DIAGNOSIS — J939 Pneumothorax, unspecified: Secondary | ICD-10-CM | POA: Diagnosis not present

## 2024-01-19 DIAGNOSIS — Z8572 Personal history of non-Hodgkin lymphomas: Secondary | ICD-10-CM

## 2024-01-19 DIAGNOSIS — Z85828 Personal history of other malignant neoplasm of skin: Secondary | ICD-10-CM

## 2024-01-19 DIAGNOSIS — R0602 Shortness of breath: Secondary | ICD-10-CM | POA: Diagnosis not present

## 2024-01-19 DIAGNOSIS — G4730 Sleep apnea, unspecified: Secondary | ICD-10-CM | POA: Diagnosis present

## 2024-01-19 DIAGNOSIS — C884 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: Secondary | ICD-10-CM | POA: Diagnosis present

## 2024-01-19 DIAGNOSIS — R918 Other nonspecific abnormal finding of lung field: Secondary | ICD-10-CM | POA: Diagnosis not present

## 2024-01-19 DIAGNOSIS — J9811 Atelectasis: Secondary | ICD-10-CM | POA: Diagnosis not present

## 2024-01-19 DIAGNOSIS — Z8249 Family history of ischemic heart disease and other diseases of the circulatory system: Secondary | ICD-10-CM

## 2024-01-19 DIAGNOSIS — Z79899 Other long term (current) drug therapy: Secondary | ICD-10-CM

## 2024-01-19 DIAGNOSIS — Z818 Family history of other mental and behavioral disorders: Secondary | ICD-10-CM

## 2024-01-19 DIAGNOSIS — K219 Gastro-esophageal reflux disease without esophagitis: Secondary | ICD-10-CM | POA: Diagnosis present

## 2024-01-19 DIAGNOSIS — G20C Parkinsonism, unspecified: Secondary | ICD-10-CM | POA: Diagnosis present

## 2024-01-19 LAB — CBC WITH DIFFERENTIAL/PLATELET
Abs Immature Granulocytes: 0.04 K/uL (ref 0.00–0.07)
Basophils Absolute: 0 K/uL (ref 0.0–0.1)
Basophils Relative: 0 %
Eosinophils Absolute: 0.2 K/uL (ref 0.0–0.5)
Eosinophils Relative: 2 %
HCT: 49.2 % (ref 39.0–52.0)
Hemoglobin: 16.9 g/dL (ref 13.0–17.0)
Immature Granulocytes: 1 %
Lymphocytes Relative: 17 %
Lymphs Abs: 1.4 K/uL (ref 0.7–4.0)
MCH: 31 pg (ref 26.0–34.0)
MCHC: 34.3 g/dL (ref 30.0–36.0)
MCV: 90.3 fL (ref 80.0–100.0)
Monocytes Absolute: 0.7 K/uL (ref 0.1–1.0)
Monocytes Relative: 8 %
Neutro Abs: 6.3 K/uL (ref 1.7–7.7)
Neutrophils Relative %: 72 %
Platelets: 116 K/uL — ABNORMAL LOW (ref 150–400)
RBC: 5.45 MIL/uL (ref 4.22–5.81)
RDW: 13.2 % (ref 11.5–15.5)
WBC: 8.7 K/uL (ref 4.0–10.5)
nRBC: 0 % (ref 0.0–0.2)

## 2024-01-19 LAB — BASIC METABOLIC PANEL WITH GFR
Anion gap: 11 (ref 5–15)
BUN: 15 mg/dL (ref 8–23)
CO2: 20 mmol/L — ABNORMAL LOW (ref 22–32)
Calcium: 9.5 mg/dL (ref 8.9–10.3)
Chloride: 105 mmol/L (ref 98–111)
Creatinine, Ser: 0.86 mg/dL (ref 0.61–1.24)
GFR, Estimated: >60 mL/min (ref >60)
Glucose, Bld: 92 mg/dL (ref 70–99)
Potassium: 4.1 mmol/L (ref 3.5–5.1)
Sodium: 136 mmol/L (ref 135–145)

## 2024-01-19 MED ORDER — TAMSULOSIN HCL 0.4 MG PO CAPS
0.4000 mg | ORAL_CAPSULE | Freq: Every day | ORAL | Status: DC
  Administered 2024-01-19 – 2024-01-21 (×3): 0.4 mg via ORAL
  Filled 2024-01-19 (×3): qty 1

## 2024-01-19 MED ORDER — ACETAMINOPHEN 650 MG RE SUPP: 650.0000 mg | Freq: Four times a day (QID) | RECTAL | Status: DC | PRN

## 2024-01-19 MED ORDER — CARBIDOPA-LEVODOPA 25-100 MG PO TABS
2.5000 | ORAL_TABLET | Freq: Three times a day (TID) | ORAL | Status: DC
  Administered 2024-01-19: 2.5 via ORAL
  Filled 2024-01-19: qty 3

## 2024-01-19 MED ORDER — ALBUTEROL SULFATE (2.5 MG/3ML) 0.083% IN NEBU: 2.5000 mg | INHALATION_SOLUTION | RESPIRATORY_TRACT | Status: DC | PRN

## 2024-01-19 MED ORDER — RASAGILINE MESYLATE 1 MG PO TABS
1.0000 mg | ORAL_TABLET | Freq: Every day | ORAL | Status: DC
  Administered 2024-01-20 – 2024-01-22 (×3): 1 mg via ORAL
  Filled 2024-01-19 (×3): qty 1

## 2024-01-19 MED ORDER — PANTOPRAZOLE SODIUM 40 MG PO TBEC
40.0000 mg | DELAYED_RELEASE_TABLET | Freq: Every day | ORAL | Status: DC
Start: 2024-01-20 — End: 2024-01-22
  Administered 2024-01-20 – 2024-01-22 (×3): 40 mg via ORAL
  Filled 2024-01-19 (×3): qty 1

## 2024-01-19 MED ORDER — UMECLIDINIUM BROMIDE 62.5 MCG/ACT IN AEPB
1.0000 | INHALATION_SPRAY | Freq: Every day | RESPIRATORY_TRACT | Status: DC
  Administered 2024-01-20 – 2024-01-22 (×3): 1 via RESPIRATORY_TRACT
  Filled 2024-01-19: qty 7

## 2024-01-19 MED ORDER — OXYCODONE HCL 5 MG PO TABS: 5.0000 mg | ORAL_TABLET | Freq: Four times a day (QID) | ORAL | Status: DC | PRN

## 2024-01-19 MED ORDER — SODIUM CHLORIDE 0.9 % IV SOLN: 500.0000 mg | Freq: Once | INTRAVENOUS | Status: DC

## 2024-01-19 MED ORDER — ASPIRIN 81 MG PO CHEW
81.0000 mg | CHEWABLE_TABLET | Freq: Every day | ORAL | Status: DC
  Administered 2024-01-20 – 2024-01-22 (×3): 81 mg via ORAL
  Filled 2024-01-19 (×3): qty 1

## 2024-01-19 MED ORDER — LOSARTAN POTASSIUM 50 MG PO TABS
50.0000 mg | ORAL_TABLET | Freq: Every day | ORAL | Status: DC
  Administered 2024-01-20 – 2024-01-22 (×3): 50 mg via ORAL
  Filled 2024-01-19 (×3): qty 1

## 2024-01-19 MED ORDER — ACETAMINOPHEN 325 MG PO TABS
650.0000 mg | ORAL_TABLET | Freq: Four times a day (QID) | ORAL | Status: DC | PRN
  Administered 2024-01-20: 650 mg via ORAL
  Filled 2024-01-19: qty 2

## 2024-01-19 MED ORDER — CARBIDOPA-LEVODOPA 25-100 MG PO TABS
2.5000 | ORAL_TABLET | Freq: Three times a day (TID) | ORAL | Status: DC
  Administered 2024-01-19 – 2024-01-22 (×9): 2.5 via ORAL
  Filled 2024-01-19 (×9): qty 3

## 2024-01-19 MED ORDER — PROPRANOLOL HCL 20 MG PO TABS
20.0000 mg | ORAL_TABLET | Freq: Three times a day (TID) | ORAL | Status: DC
Start: 2024-01-19 — End: 2024-01-22
  Administered 2024-01-19 – 2024-01-22 (×8): 20 mg via ORAL
  Filled 2024-01-19 (×8): qty 1

## 2024-01-19 MED ORDER — ENOXAPARIN SODIUM 40 MG/0.4ML IJ SOSY
40.0000 mg | PREFILLED_SYRINGE | INTRAMUSCULAR | Status: DC
  Administered 2024-01-19 – 2024-01-21 (×3): 40 mg via SUBCUTANEOUS
  Filled 2024-01-19 (×3): qty 0.4

## 2024-01-19 MED ORDER — FLUTICASONE PROPIONATE 50 MCG/ACT NA SUSP
1.0000 | Freq: Every day | NASAL | Status: DC | PRN
  Administered 2024-01-20 (×2): 1 via NASAL
  Filled 2024-01-19: qty 16

## 2024-01-19 MED ORDER — ARFORMOTEROL TARTRATE 15 MCG/2ML IN NEBU
15.0000 ug | INHALATION_SOLUTION | Freq: Two times a day (BID) | RESPIRATORY_TRACT | Status: DC
  Administered 2024-01-19 – 2024-01-21 (×4): 15 ug via RESPIRATORY_TRACT
  Filled 2024-01-19 (×4): qty 2

## 2024-01-19 MED ORDER — MIRABEGRON ER 25 MG PO TB24
25.0000 mg | ORAL_TABLET | Freq: Every day | ORAL | Status: DC
  Administered 2024-01-20 – 2024-01-22 (×3): 25 mg via ORAL
  Filled 2024-01-19 (×3): qty 1

## 2024-01-19 MED ORDER — SODIUM CHLORIDE 0.9 % IV SOLN
1.0000 g | Freq: Once | INTRAVENOUS | Status: AC
  Administered 2024-01-19: 1 g via INTRAVENOUS
  Filled 2024-01-19: qty 10

## 2024-01-19 NOTE — Assessment & Plan Note (Signed)
 Mild.  Due to lymphoma

## 2024-01-19 NOTE — Assessment & Plan Note (Signed)
 Follows with Dr. Onesimo

## 2024-01-19 NOTE — Assessment & Plan Note (Signed)
BP normal °-Continue losartan °

## 2024-01-19 NOTE — ED Provider Notes (Signed)
 [place] EMERGENCY DEPARTMENT AT [facility] Provider Note   CSN: [id number] Arrival date & time: 01/19/24  1225     Patient presents with: Shortness of Breath   Chad Knapp is a 73 y.o. male.   Patient here with shortness of breath.  Concern for possible collapsed lung in the right per the patient.  He went to see the [place] today because he developed some worsening cough and shortness of breath.  He was diagnosed with COVID last week.  He has a history of lymphoma non-Hodgkin's.  He states he has it in his right lung.  He denies any fevers or chills.  He is been on Paxlovid .  He states he was feeling really well the last few days but felt worse this morning.  Denies any chest pain weakness numbness tingling.  Denies any sputum production but still with cough.  Denies any abdominal pain nausea vomiting diarrhea.  The history is provided by the patient.       Prior to Admission medications   Medication Sig Start Date End Date Taking? Authorizing Provider  acetaminophen  (TYLENOL ) 500 MG tablet Take 1,000 mg by mouth every 6 (six) hours as needed for moderate pain.    [provider]  albuterol  (PROVENTIL  HFA;VENTOLIN  HFA) 108 (90 BASE) MCG/ACT inhaler Inhale 2 puffs into the lungs every 4 (four) hours as needed for wheezing or shortness of breath.    [provider]  ASPIRIN  81 PO Take 1 tablet by mouth daily.    [provider]  camphor-menthol  VIKKI) lotion Apply 1 Application topically as needed for itching.    [provider]  carbidopa -levodopa  (SINEMET  IR) 25-100 MG tablet Take 2.5 tablets by mouth 3 (three) times daily. 10/01/20   [provider]  carboxymethylcellulose (REFRESH PLUS) 0.5 % SOLN Place 1 drop into both eyes 4 (four) times daily. 11/02/23   [provider]  famotidine (PEPCID) 40 MG tablet TAKE ONE TABLET BY MOUTH DAILY FOR ACID REFLUX - REPLACES PANTOPRAZOLE  02/04/22   [provider]   fluticasone  (FLONASE ) 50 MCG/ACT nasal spray Place 2 sprays into the nose daily as needed for rhinitis or allergies.    [provider]  gabapentin (NEURONTIN) 100 MG capsule Take 200 mg by mouth 3 (three) times daily.    [provider]  ibuprofen (ADVIL) 800 MG tablet Take 800 mg by mouth every 8 (eight) hours as needed for moderate pain. 08/23/21   [provider]  Lactobacillus (ACIDOPHILUS/BIFIDUS PO) Take 2 tablets by mouth daily.    [provider]  loratadine (ALLERGY) 10 MG tablet Take 10 mg by mouth daily. [employer] brand    [provider]  losartan  (COZAAR ) 100 MG tablet Take 50 mg by mouth daily. 10/31/20   [provider]  methocarbamol  (ROBAXIN ) 500 MG tablet Take 500 mg by mouth every 8 (eight) hours as needed for muscle spasms. 08/16/21   [provider]  mirabegron  ER (MYRBETRIQ ) 25 MG TB24 tablet Take 25 mg by mouth daily. 12/03/23   [provider]  Multiple Vitamin (MULTIVITAMIN WITH MINERALS) TABS tablet Take 1 tablet by mouth daily.    [provider]  Multiple Vitamins-Minerals (CENTRUM ADULTS PO) Take 1 tablet by mouth daily.    [provider]  nirmatrelvir/ritonavir (PAXLOVID , 300/100,) 20 x 150 MG & 10 x 100MG  TBPK Take 3 tablets by mouth 2 (two) times daily for 5 days. Patient GFR is 79. Take nirmatrelvir (150 mg) two tablets  twice daily for 5 days and ritonavir (100 mg) one tablet twice daily for 5 days. 01/14/24 01/19/24  Billy Asberry FALCON, PA-C  ofloxacin  (OCUFLOX ) 0.3 % ophthalmic solution Place 1 drop into the left eye 4 (four) times daily. 02/14/22   Raspet, Erin K, PA-C  Omega-3 Fatty Acids (FISH OIL) 1000 MG CAPS 600 mg daily.    [provider]  Omega-3 Fatty Acids (OMEGA 3 PO) Take 600 mg by mouth daily.    [provider]  oxyCODONE  (ROXICODONE ) 5 MG immediate release tablet Take 1 tablet (5 mg total) by mouth every 6 (six) hours as needed for severe pain. 12/07/22    Dreama Longs, MD  pantoprazole  (PROTONIX ) 40 MG tablet Take 40 mg by mouth daily.    [provider]  propranolol  (INDERAL ) 20 MG tablet Take 20 mg by mouth 3 (three) times daily.    [provider]  Psyllium (METAMUCIL PO) Take 1 g every day by oral route.    [provider]  rasagiline  (AZILECT ) 1 MG TABS tablet TAKE ONE TABLET BY MOUTH ONCE A DAY TO SLOW PARKINSON'S DISEASE - NOT A DOSE CHANGE 10/29/21   [provider]  Spacer/Aero-Holding Chambers [facility] DIAMOND) DEVI See admin instructions.    [provider]  tamsulosin  (FLOMAX ) 0.4 MG CAPS capsule Take 1 capsule (0.4 mg total) by mouth daily. 12/10/22   Lorren Greig PARAS, NP  Tiotropium Bromide-Olodaterol (STIOLTO RESPIMAT ) 2.5-2.5 MCG/ACT AERS Inhale 2 puffs into the lungs daily. 10/03/21   Brenna Adine CROME, DO  Tiotropium Bromide-Olodaterol (STIOLTO RESPIMAT ) 2.5-2.5 MCG/ACT AERS Inhale 2 puffs into the lungs daily. 01/17/22   Ruthell Lauraine FALCON, NP  venlafaxine XR (EFFEXOR-XR) 37.5 MG 24 hr capsule Take 37.5 mg by mouth daily.    [provider]    Allergies: Lisinopril    Review of Systems  Updated Vital Signs BP (!) 144/77 (BP Location: Right Arm)   Pulse 85   Temp 97.6 F (36.4 C)   Resp 14   Ht 6' 3 (1.905 m)   Wt 116.6 kg   SpO2 96%   BMI 32.12 kg/m   Physical Exam Vitals and nursing note reviewed.  Constitutional:      General: He is not in acute distress.    Appearance: He is well-developed.  HENT:     Head: Normocephalic and atraumatic.     Mouth/Throat:     Mouth: Mucous membranes are moist.  Eyes:     Conjunctiva/sclera: Conjunctivae normal.     Pupils: Pupils are equal, round, and reactive to light.  Cardiovascular:     Rate and Rhythm: Normal rate and regular rhythm.     Pulses: Normal pulses.     Heart sounds: Normal heart sounds. No murmur heard. Pulmonary:     Effort: Pulmonary effort is normal. No respiratory distress.     Breath sounds:  Normal breath sounds.     Comments: I hear fairly symmetric breath sounds anteriorly Abdominal:     Palpations: Abdomen is soft.     Tenderness: There is no abdominal tenderness.  Musculoskeletal:        General: No swelling. Normal range of motion.     Cervical back: Normal range of motion and neck supple.     Right lower leg: No edema.     Left lower leg: No edema.  Skin:    General: Skin is warm and dry.     Capillary Refill: Capillary refill takes less than 2 seconds.  Neurological:     Mental Status: He is alert.  Psychiatric:        Mood and Affect: Mood normal.     (all labs ordered are listed, but only abnormal results are displayed) Labs Reviewed  CBC WITH DIFFERENTIAL/PLATELET - Abnormal; Notable for the following components:      Result Value   Platelets 116 (*)    All other components within normal limits  BASIC METABOLIC PANEL WITH GFR - Abnormal; Notable for the following components:   CO2 20 (*)    All other components within normal limits  CULTURE, BLOOD (ROUTINE X 2)  CULTURE, BLOOD (ROUTINE X 2)  I-STAT CG4 LACTIC ACID, ED    EKG: EKG Interpretation Date/Time:  Tuesday January 19 2024 12:50:57 EDT Ventricular Rate:  84 PR Interval:    QRS Duration:  100 QT Interval:  366 QTC Calculation: 433 R Axis:   91  Text Interpretation: nsr, artifact Right axis deviation Low voltage, precordial leads Confirmed by Ruthe Cornet [id number]) on 01/19/2024 12:53:26 PM  Radiology: [facility] Chest Portable 1 View Addendum Date: 01/19/2024 ADDENDUM REPORT: 01/19/2024 14:04 ADDENDUM: Critical Value/emergent results were called by telephone at the time of interpretation by Dr Duwaine Severs on January 19, 2024 at 11 a.m. to provider Dr. Ruthe who verbally acknowledged these results. Electronically Signed   By: Megan  Zare M.D.   On: 01/19/2024 14:04   Result Date: 01/19/2024 CLINICAL DATA:  Shortness of breath, COVID infection, history of marginal zone lymphoma of the lungs EXAM:  PORTABLE CHEST 1 VIEW COMPARISON:  Chest CT Oct 29, 2022 the FINDINGS: There is paucity of vessels in right lung apex suggestive of pneumothorax with inter pleural distance of 3.5 cm in the right apex. Right mid/lower hemithorax linear bands of atelectasis/consolidation and patchy opacities. Left lower hemithorax band of subsegmental atelectasis/scarring. The heart and mediastinal contour is otherwise unremarkable. No midline shift. Status post ACDF.  No acute osseous abnormality. IMPRESSION: Right apical pneumothorax. Bilateral lower lobe patchy areas of consolidation/atelectasis which may represent infection/inflammation versus malignancy related/posttreatment changes or recurrent disease. Chest CT or PET-CT can be utilized for further assessment if clinically warranted. Electronically Signed: By: Megan  Zare M.D. On: 01/19/2024 13:55     Procedures   Medications Ordered in the ED  cefTRIAXone  (ROCEPHIN ) 1 g in sodium chloride  0.9 % 100 mL IVPB (has no administration in time range)  azithromycin  (ZITHROMAX ) 500 mg in sodium chloride  0.9 % 250 mL IVPB (has no administration in time range)                                    Medical Decision Making Amount and/or Complexity of Data Reviewed Labs: ordered. Radiology: ordered.  Risk Decision regarding hospitalization.   Chad Knapp is here with shortness of breath cough.  Concerned that he might have a collapsed lung may be pneumonia on chest x-ray.  He was sent here by the [facility] for evaluation.  He was diagnosed with COVID last week.  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  He has been taking Paxlovid .  He was feeling well the last few days and then felt worsening cough and shortness of breath this morning went for an x-ray at the [facility] and they were concerned for collapsed lung and sent him here for evaluation.  She has got a little bit diminished breath sounds throughout but sounds  equal.  Does not appear  to be in any respiratory distress.  His oxygen on room air is normal.  Overall we will get x-ray basic labs to see if he has a pneumothorax, infectious process.  I have no concern for ACS.  EKG shows sinus rhythm.  No ischemic changes.  He has a history of Parkinson's.  He is not on any current cancer treatments at this time.  Overall chest x-ray does show small right apical pneumothorax with may be infectious process behind that.  However no leukocytosis anemia or electrolyte abnormality otherwise per my review interpretation of labs.  I have talked with pulmonary team Leita Furth, overall pulmonology team recommends admission to the medicine team.  Chest x-rays as needed if he develops worsening symptoms but at this time we will hold on chest tube and see if this resolves on its own with oxygen overnight.  Repeat x-ray in the morning otherwise.  Overall we will start some IV antibiotics check blood cultures and lactic acid and treat for secondary pneumonia due to viral process.  Patient placed on 2 L of oxygen.  Hemodynamically stable.  Pulmonary team available if needed.  This chart was dictated using voice recognition software.  Despite best efforts to proofread,  errors can occur which can change the documentation meaning.      Final diagnoses:  Pneumothorax, unspecified type  Community acquired pneumonia, unspecified laterality    ED Discharge Orders     None          Ruthe Cornet, DO 01/19/24 1423

## 2024-01-19 NOTE — Assessment & Plan Note (Signed)
-   Hold CPAP given PTX

## 2024-01-19 NOTE — H&P (Signed)
 History and Physical    Patient: Chad Knapp FMW:[id number] DOB: Apr 09, 1951 DOA: 01/19/2024 DOS: the patient was seen and examined on 01/19/2024 PCP: Tanda Bleacher, MD  Patient coming from: Home  Chief Complaint:  Chief Complaint  Patient presents with   Shortness of Breath       HPI:  [name] with obesity, OSA on CPAP, extranodal pulmonary marginal zone lymphoma, HTN, Parkinson's presented with chest pain.  Was recently diagnosed with COVID and started on Paxlovid . Was feeling better, finishing the Paxlovid  soon, but today woke with new chest pain and dyspnea.  Went to his [facility] doctor, they did a CXR, saw a right apical pneumo and recommended he come to the hospital.  In the ER, saturating well on room air.  CXR showed bilateral opacities and small right apical PTX.  Pulm consulted, recommended conservative mgmt without chest tube, observation overnight.        Review of Systems  Constitutional:  Negative for chills and fever.  Respiratory:  Positive for cough.   Cardiovascular:  Positive for chest pain.  All other systems reviewed and are negative.    Past Medical History:  Diagnosis Date   Anxiety 2015   Appendicitis    Arthritis 2000's   Cancer (HCC)    basal cell -removed - back, shoulder and chest, squamous cell on nose - removed   COVID 12/2021   no symptoms per pt.   Depression    GERD (gastroesophageal reflux disease) 2010   History of kidney stones    Hx of transfusion of packed red blood cells 06/23/1972   Hypertension    Lung cancer (HCC) 2023   Non Hodgkin's lymphoma (HCC)    Pneumonia    Shortness of breath    Skin cancer 1990's   Sleep apnea    cpap   Past Surgical History:  Procedure Laterality Date   ANTERIOR CERVICAL DECOMP/DISCECTOMY FUSION N/A 02/03/2013   Procedure: ACDF C6 - 7     1 LEVEL;  Surgeon: Donaciano Sprang, MD;  Location: [facility] OR;  Service: Orthopedics;  Laterality: N/A;   APPENDECTOMY  1961   BASAL CELL CARCINOMA EXCISION      BRONCHIAL BIOPSY  01/07/2022   Procedure: BRONCHIAL BIOPSIES;  Surgeon: Brenna Adine CROME, DO;  Location: [facility] ENDOSCOPY;  Service: Pulmonary;;   BRONCHIAL NEEDLE ASPIRATION BIOPSY  01/07/2022   Procedure: BRONCHIAL NEEDLE ASPIRATION BIOPSIES;  Surgeon: Brenna Adine CROME, DO;  Location: [facility] ENDOSCOPY;  Service: Pulmonary;;   CERVICAL FUSION  02/03/2013   Dr Sprang   FRACTURE SURGERY Left 1974   tib/fib/femur   SPINE SURGERY  2014   TESTICLE REMOVAL Left    TONSILLECTOMY     Social History:  reports that he quit smoking about 16 years ago. His smoking use included cigarettes. He started smoking about 53 years ago. He has a 18.5 pack-year smoking history. He has never used smokeless tobacco. He reports current alcohol use. He reports that he does not currently use drugs after having used the following drugs: Marijuana.  Allergies  Allergen Reactions   Lisinopril Other (See Comments)    Family History  Problem Relation Age of Onset   Mental illness Mother    Dementia Mother    Other Father        heart attack or stroke   Melanoma Brother     Prior to Admission medications   Medication Sig Start Date End Date Taking? Authorizing Provider  acetaminophen  (TYLENOL ) 500 MG tablet Take 1,000  mg by mouth every 6 (six) hours as needed for moderate pain.    [provider]  albuterol  (PROVENTIL  HFA;VENTOLIN  HFA) 108 (90 BASE) MCG/ACT inhaler Inhale 2 puffs into the lungs every 4 (four) hours as needed for wheezing or shortness of breath.    [provider]  ASPIRIN  81 PO Take 1 tablet by mouth daily.    [provider]  camphor-menthol  VIKKI) lotion Apply 1 Application topically as needed for itching.    [provider]  carbidopa -levodopa  (SINEMET  IR) 25-100 MG tablet Take 2.5 tablets by mouth 3 (three) times daily. 10/01/20   [provider]  carboxymethylcellulose (REFRESH PLUS) 0.5 % SOLN Place 1 drop into both eyes 4 (four) times daily. 11/02/23    [provider]  famotidine (PEPCID) 40 MG tablet TAKE ONE TABLET BY MOUTH DAILY FOR ACID REFLUX - REPLACES PANTOPRAZOLE  02/04/22   [provider]  fluticasone  (FLONASE ) 50 MCG/ACT nasal spray Place 2 sprays into the nose daily as needed for rhinitis or allergies.    [provider]  gabapentin (NEURONTIN) 100 MG capsule Take 200 mg by mouth 3 (three) times daily.    [provider]  ibuprofen (ADVIL) 800 MG tablet Take 800 mg by mouth every 8 (eight) hours as needed for moderate pain. 08/23/21   [provider]  Lactobacillus (ACIDOPHILUS/BIFIDUS PO) Take 2 tablets by mouth daily.    [provider]  loratadine (ALLERGY) 10 MG tablet Take 10 mg by mouth daily. [employer] brand    [provider]  losartan  (COZAAR ) 100 MG tablet Take 50 mg by mouth daily. 10/31/20   [provider]  methocarbamol  (ROBAXIN ) 500 MG tablet Take 500 mg by mouth every 8 (eight) hours as needed for muscle spasms. 08/16/21   [provider]  mirabegron  ER (MYRBETRIQ ) 25 MG TB24 tablet Take 25 mg by mouth daily. 12/03/23   [provider]  Multiple Vitamin (MULTIVITAMIN WITH MINERALS) TABS tablet Take 1 tablet by mouth daily.    [provider]  Multiple Vitamins-Minerals (CENTRUM ADULTS PO) Take 1 tablet by mouth daily.    [provider]  ofloxacin  (OCUFLOX ) 0.3 % ophthalmic solution Place 1 drop into the left eye 4 (four) times daily. 02/14/22   Raspet, Erin K, PA-C  Omega-3 Fatty Acids (FISH OIL) 1000 MG CAPS 600 mg daily.    [provider]  Omega-3 Fatty Acids (OMEGA 3 PO) Take 600 mg by mouth daily.    [provider]  oxyCODONE  (ROXICODONE ) 5 MG immediate release tablet Take 1 tablet (5 mg total) by mouth every 6 (six) hours as needed for severe pain. 12/07/22   Dreama Longs, MD  pantoprazole  (PROTONIX ) 40 MG tablet Take 40 mg by mouth daily.    [provider]  propranolol  (INDERAL )  20 MG tablet Take 20 mg by mouth 3 (three) times daily.    [provider]  Psyllium (METAMUCIL PO) Take 1 g every day by oral route.    [provider]  rasagiline  (AZILECT ) 1 MG TABS tablet TAKE ONE TABLET BY MOUTH ONCE A DAY TO SLOW PARKINSON'S DISEASE - NOT A DOSE CHANGE 10/29/21   [provider]  Spacer/Aero-Holding Chambers [facility] DIAMOND) DEVI See admin instructions.    [provider]  tamsulosin  (FLOMAX ) 0.4 MG CAPS capsule Take 1 capsule (0.4 mg total) by mouth daily. 12/10/22   Lorren Greig PARAS, NP  Tiotropium Bromide-Olodaterol (STIOLTO RESPIMAT ) 2.5-2.5 MCG/ACT AERS Inhale 2 puffs into the  lungs daily. 10/03/21   Icard, Adine CROME, DO  Tiotropium Bromide-Olodaterol (STIOLTO RESPIMAT ) 2.5-2.5 MCG/ACT AERS Inhale 2 puffs into the lungs daily. 01/17/22   Ruthell Lauraine FALCON, NP  venlafaxine XR (EFFEXOR-XR) 37.5 MG 24 hr capsule Take 37.5 mg by mouth daily.    [provider]    Physical Exam: Vitals:   01/19/24 1229 01/19/24 1250 01/19/24 1251 01/19/24 1515  BP: (!) 144/77   137/84  Pulse: 85   87  Resp: 14   19  Temp: 97.6 F (36.4 C)     SpO2: 94% 96%  95%  Weight:   116.6 kg   Height:   6' 3 (1.905 m)    Adult male, sitting up in bed, interactive and appropriate RRR, no murmurs, no peripheral edema Respiratory rate normal, unlabored, lung sounds diminished at the right apex, otherwise normal, no wheezing, no rales Abdomen soft, no tenderness palpation or guarding, no ascites or distention Attention normal, affect appropriate, judgment and insight appear normal Upper extremity strength 5/5 and symmetric, speech fluent, oriented x 3    Data Reviewed: Discussed with pulmonology Chest x-ray, personally reviewed, shows small apical thorax on the right, bilateral infiltrates Basic metabolic panel shows normal electrolytes and renal function CBC shows mild chronic anemia    Assessment and Plan: * Pneumothorax In the setting of  marginal zone lymphoma of the lung, recent COVID. - Hold CPAP - Supplemental Oxygen - AM CXR - Consult Pulm, they will see tomorrow     COVID-19 virus infection Resolving symptoms at the time of presentation.  Completed Paxlovid  this morning. - No further treatment - Abx given empirically in the ER, but no fever, leukocytosis to suggest infection, I suspect CXR infiltrates are his lymphoma, stop antibiotics    Sleep apnea - Hold CPAP given PTX  Parkinsonism, unspecified (HCC) - Continue rasagiline  and Sinemet  9a,1p,5p  Obesity Class I, bmi 32, complicates care  Essential hypertension BP normal - Continue losartan   Marginal zone lymphoma (HCC) Follows with Dr. Onesimo  Thrombocytopenia (HCC) Mild.  Due to lymphoma        Advance Care Planning: Full code, confirmed with patient and wife  Consults: Pulmonology, will see in a.m.  Family Communication: Wife at the bedside  Severity of Illness: The appropriate patient status for this patient is OBSERVATION. Observation status is judged to be reasonable and necessary in order to provide the required intensity of service to ensure the patient's safety. The patient's presenting symptoms, physical exam findings, and initial radiographic and laboratory data in the context of their medical condition is felt to place them at decreased risk for further clinical deterioration. Furthermore, it is anticipated that the patient will be medically stable for discharge from the hospital within 2 midnights of admission.   Author: Lonni SHAUNNA Dalton, MD 01/19/2024 4:11 PM  For on call review [email or web address].

## 2024-01-19 NOTE — Assessment & Plan Note (Signed)
 Class I, bmi 32, complicates care

## 2024-01-19 NOTE — Assessment & Plan Note (Signed)
 Resolving symptoms at the time of presentation.  Completed Paxlovid  this morning. - No further treatment - Abx given empirically in the ER, but no fever, leukocytosis to suggest infection, I suspect CXR infiltrates are his lymphoma, stop antibiotics

## 2024-01-19 NOTE — Assessment & Plan Note (Signed)
-   Hold CPAP - Supplemental Oxygen - AM CXR - Consult Pulm, they will see tomorrow

## 2024-01-19 NOTE — Assessment & Plan Note (Signed)
-   Continue rasagiline  and Sinemet  9a,1p,5p

## 2024-01-19 NOTE — Consult Note (Signed)
 Called by EDP with finding of left apical pneumothorax for recommendations.  73 year old male with history of non-hodgkins lymphoma with pulmonary mets. Diagnosed with COVID last week and placed on Paxlovid . Felt worse this morning but without chest pain, or new sputum production. Ongoing cough from viral illness. He had CXR at [facility] with PTX so presented here. CXR here with 3.5cm ptx from pleural edge.   Dr. Annella evaluated the image. Agree with have patient admit to [facility] for observation. Supplemental O2. Repeat CXR in AM if he remains asymptomatic. PRN imaging if he were to decline and would need CT placement at that time.   Please re-page if further needs arise  Tinnie FORBES Furth, PA-C [facility] 01/19/24 2:25 PM  Please see [email or web address] for pager details.  From 7A-7P if no response, please call [phone] After hours, please call ELink [phone]

## 2024-01-19 NOTE — ED Triage Notes (Signed)
 Pt bib pov from [place]. Pt presents with a confirmed collapsed lung on the right side. Diagnosed with covid last week, has been taking pax. History of lung cancer.

## 2024-01-19 NOTE — Hospital Course (Addendum)
 [name] with obesity, OSA on CPAP, extranodal pulmonary marginal zone lymphoma, HTN, Parkinson's presented with chest pain.  Was recently diagnosed with COVID and started on Paxlovid . Was feeling better, finishing the Paxlovid  soon, but today woke with new chest pain and dyspnea.  Went to his [place] doctor, they did a CXR, saw a right apical pneumo and recommended he come to the hospital.  In the ER, saturating well on room air.  CXR showed bilateral opacities and small right apical PTX.  Pulm consulted, recommended conservative mgmt without chest tube, observation overnight.

## 2024-01-20 ENCOUNTER — Observation Stay (HOSPITAL_COMMUNITY)

## 2024-01-20 DIAGNOSIS — C884 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: Secondary | ICD-10-CM | POA: Diagnosis present

## 2024-01-20 DIAGNOSIS — Z7982 Long term (current) use of aspirin: Secondary | ICD-10-CM | POA: Diagnosis not present

## 2024-01-20 DIAGNOSIS — G20A1 Parkinson's disease without dyskinesia, without mention of fluctuations: Secondary | ICD-10-CM

## 2024-01-20 DIAGNOSIS — Z8249 Family history of ischemic heart disease and other diseases of the circulatory system: Secondary | ICD-10-CM | POA: Diagnosis not present

## 2024-01-20 DIAGNOSIS — J9311 Primary spontaneous pneumothorax: Secondary | ICD-10-CM

## 2024-01-20 DIAGNOSIS — R0602 Shortness of breath: Secondary | ICD-10-CM | POA: Diagnosis present

## 2024-01-20 DIAGNOSIS — Z981 Arthrodesis status: Secondary | ICD-10-CM | POA: Diagnosis not present

## 2024-01-20 DIAGNOSIS — J939 Pneumothorax, unspecified: Secondary | ICD-10-CM | POA: Diagnosis not present

## 2024-01-20 DIAGNOSIS — Z85828 Personal history of other malignant neoplasm of skin: Secondary | ICD-10-CM | POA: Diagnosis not present

## 2024-01-20 DIAGNOSIS — R9389 Abnormal findings on diagnostic imaging of other specified body structures: Secondary | ICD-10-CM | POA: Diagnosis not present

## 2024-01-20 DIAGNOSIS — I10 Essential (primary) hypertension: Secondary | ICD-10-CM | POA: Diagnosis present

## 2024-01-20 DIAGNOSIS — U071 COVID-19: Secondary | ICD-10-CM | POA: Diagnosis not present

## 2024-01-20 DIAGNOSIS — Z85118 Personal history of other malignant neoplasm of bronchus and lung: Secondary | ICD-10-CM | POA: Diagnosis not present

## 2024-01-20 DIAGNOSIS — Z888 Allergy status to other drugs, medicaments and biological substances status: Secondary | ICD-10-CM | POA: Diagnosis not present

## 2024-01-20 DIAGNOSIS — R918 Other nonspecific abnormal finding of lung field: Secondary | ICD-10-CM | POA: Diagnosis not present

## 2024-01-20 DIAGNOSIS — Z87891 Personal history of nicotine dependence: Secondary | ICD-10-CM | POA: Diagnosis not present

## 2024-01-20 DIAGNOSIS — E66811 Obesity, class 1: Secondary | ICD-10-CM | POA: Diagnosis present

## 2024-01-20 DIAGNOSIS — J9383 Other pneumothorax: Secondary | ICD-10-CM | POA: Diagnosis not present

## 2024-01-20 DIAGNOSIS — J9811 Atelectasis: Secondary | ICD-10-CM | POA: Diagnosis not present

## 2024-01-20 DIAGNOSIS — I700 Atherosclerosis of aorta: Secondary | ICD-10-CM | POA: Diagnosis not present

## 2024-01-20 DIAGNOSIS — M1990 Unspecified osteoarthritis, unspecified site: Secondary | ICD-10-CM | POA: Diagnosis present

## 2024-01-20 DIAGNOSIS — C8580 Other specified types of non-Hodgkin lymphoma, unspecified site: Secondary | ICD-10-CM

## 2024-01-20 DIAGNOSIS — Z818 Family history of other mental and behavioral disorders: Secondary | ICD-10-CM | POA: Diagnosis not present

## 2024-01-20 DIAGNOSIS — Z79899 Other long term (current) drug therapy: Secondary | ICD-10-CM | POA: Diagnosis not present

## 2024-01-20 DIAGNOSIS — U099 Post covid-19 condition, unspecified: Secondary | ICD-10-CM | POA: Diagnosis present

## 2024-01-20 DIAGNOSIS — J189 Pneumonia, unspecified organism: Secondary | ICD-10-CM | POA: Diagnosis not present

## 2024-01-20 DIAGNOSIS — Z808 Family history of malignant neoplasm of other organs or systems: Secondary | ICD-10-CM | POA: Diagnosis not present

## 2024-01-20 DIAGNOSIS — K219 Gastro-esophageal reflux disease without esophagitis: Secondary | ICD-10-CM | POA: Diagnosis present

## 2024-01-20 DIAGNOSIS — Z4682 Encounter for fitting and adjustment of non-vascular catheter: Secondary | ICD-10-CM | POA: Diagnosis not present

## 2024-01-20 DIAGNOSIS — D6959 Other secondary thrombocytopenia: Secondary | ICD-10-CM | POA: Diagnosis present

## 2024-01-20 DIAGNOSIS — Z6832 Body mass index (BMI) 32.0-32.9, adult: Secondary | ICD-10-CM | POA: Diagnosis not present

## 2024-01-20 LAB — BASIC METABOLIC PANEL WITH GFR
Anion gap: 9 (ref 5–15)
BUN: 12 mg/dL (ref 8–23)
CO2: 20 mmol/L — ABNORMAL LOW (ref 22–32)
Calcium: 9.2 mg/dL (ref 8.9–10.3)
Chloride: 102 mmol/L (ref 98–111)
Creatinine, Ser: 0.79 mg/dL (ref 0.61–1.24)
GFR, Estimated: >60 mL/min (ref >60)
Glucose, Bld: 109 mg/dL — ABNORMAL HIGH (ref 70–99)
Potassium: 3.8 mmol/L (ref 3.5–5.1)
Sodium: 131 mmol/L — ABNORMAL LOW (ref 135–145)

## 2024-01-20 LAB — CBC
HCT: 44.5 % (ref 39.0–52.0)
Hemoglobin: 15.7 g/dL (ref 13.0–17.0)
MCH: 31 pg (ref 26.0–34.0)
MCHC: 35.3 g/dL (ref 30.0–36.0)
MCV: 87.8 fL (ref 80.0–100.0)
Platelets: 138 K/uL — ABNORMAL LOW (ref 150–400)
RBC: 5.07 MIL/uL (ref 4.22–5.81)
RDW: 12.9 % (ref 11.5–15.5)
WBC: 8.1 K/uL (ref 4.0–10.5)
nRBC: 0 % (ref 0.0–0.2)

## 2024-01-20 LAB — SARS CORONAVIRUS 2 BY RT PCR: SARS Coronavirus 2 by RT PCR: POSITIVE — AB

## 2024-01-20 MED ORDER — MELATONIN 5 MG PO TABS
5.0000 mg | ORAL_TABLET | Freq: Every evening | ORAL | Status: DC | PRN
  Administered 2024-01-21: 5 mg via ORAL
  Filled 2024-01-20: qty 1

## 2024-01-20 MED ORDER — SODIUM CHLORIDE 0.9% FLUSH
10.0000 mL | Freq: Three times a day (TID) | INTRAVENOUS | Status: DC
  Administered 2024-01-20 – 2024-01-22 (×5): 10 mL via INTRAPLEURAL

## 2024-01-20 NOTE — Procedures (Signed)
 Insertion of Chest Tube Procedure Note  Chad Knapp  [id number]  [birth year]  Date:01/20/24  Time:11:12 AM    Provider Performing: Tinnie FORBES Furth   Procedure: Pleural Catheter Insertion w/ Imaging Guidance ([id number])  Indication(s) Pneumothorax  Consent Risks of the procedure as well as the alternatives and risks of each were explained to the patient and/or caregiver.  Consent for the procedure was obtained and is signed in the bedside chart  Anesthesia Topical only with 1% lidocaine     Time Out Verified patient identification, verified procedure, site/side was marked, verified correct patient position, special equipment/implants available, medications/allergies/relevant history reviewed, required imaging and test results available.   Sterile Technique Maximal sterile technique including full sterile barrier drape, hand hygiene, sterile gown, sterile gloves, mask, hair covering, sterile ultrasound probe cover (if used).   Procedure Description Ultrasound used to identify appropriate pleural anatomy for placement and overlying skin marked. Area of placement cleaned and draped in sterile fashion.  A pigtail pigtail pleural catheter was placed into the right pleural space using Seldinger technique. Appropriate return of air was obtained.  The tube was connected to atrium and placed on -20 cm H2O wall suction.   Complications/Tolerance None; patient tolerated the procedure well. Chest X-ray is ordered to verify placement.   EBL Minimal  Specimen(s) none  Tinnie FORBES Furth, PA-C [facility] 01/20/24 11:12 AM  Please see [email or web address] for pager details.  From 7A-7P if no response, please call [phone] After hours, please call ELink [phone]

## 2024-01-20 NOTE — Plan of Care (Signed)

## 2024-01-20 NOTE — Consult Note (Signed)
 NAME:  Chad Knapp, MRN:  [id number], DOB:  29-May-1951, LOS: 0 ADMISSION DATE:  01/19/2024, CONSULTATION DATE:  01/20/24 REFERRING MD:  TRH, CHIEF COMPLAINT:  PTX   History of Present Illness:  73 year old man history of right upper lobe lung nodule turned out to be lymphoma on biopsy here after COVID an acute episode of short chest pain seen at the [place] with chest x-ray revealing pneumothorax.  Usual state of health.  Got sick.  Coughing.  Diagnosed with COVID.  Randomly and for short period time sharp pain in the chest.  Radiated to back.  Went to [place] for evaluation.  Chest x-ray revealed pneumothorax.  He was instructed to present to the ED.  In the ED about 3.5 cm apical pneumothorax, smaller laterally.  Observed overnight.  Largely asymptomatic.  But increase in size.  Prompting chest tube placement 7/30.  Pertinent  Medical History  Lymphoma  Significant Hospital Events: Including procedures, antibiotic start and stop dates in addition to other pertinent events   7/29 admitted with pneumothorax asymptomatic 7/30 enlargement pneumothorax, chest tube placed  Interim History / Subjective:    Objective    Blood pressure (!) 107/59, pulse 84, temperature (!) 97.5 F (36.4 C), temperature source Oral, resp. rate 18, height 6' 3 (1.905 m), weight 116.6 kg, SpO2 96%.        Intake/Output Summary (Last 24 hours) at 01/20/2024 1247 Last data filed at 01/20/2024 1000 Gross per 24 hour  Intake 240 ml  Output 350 ml  Net -110 ml   Filed Weights   01/19/24 1251  Weight: 116.6 kg    Examination: General: Sitting up in the bed in no acute distress HENT: Atraumatic normocephalic Lungs: Clear bilaterally Cardiovascular: Regular rate and rhythm Abdomen: Nondistended Extremities: Edema noted, significant scarring over left shin Neuro: No deficits noted  Resolved problem list   Assessment and Plan   Presumed primary spontaneous pneumothorax: In setting of COVID infection,  suspect with rapid intrathoracic pressure swings with coughing.  Has increased in size.  Albeit largely asymptomatic.  Did have some pain, acute pain that likely occurred at the time of pneumothorax development.  But overall doing fine.  Given increased will place chest tube. -- Chest tube placement at bedside -- Chest tube to suction, if stable in the a.m. consider waterseal and then clamping trial subsequently in the coming hours to days. -- Daily chest x-ray while chest tube in place  Best Practice (right click and Reselect all SmartList Selections daily)   Per primary Labs   CBC: Recent Labs  Lab 01/19/24 1245 01/20/24 0539  WBC 8.7 8.1  NEUTROABS 6.3  --   HGB 16.9 15.7  HCT 49.2 44.5  MCV 90.3 87.8  PLT 116* 138*    Basic Metabolic Panel: Recent Labs  Lab 01/19/24 1245 01/20/24 0539  NA 136 131*  K 4.1 3.8  CL 105 102  CO2 20* 20*  GLUCOSE 92 109*  BUN 15 12  CREATININE 0.86 0.79  CALCIUM 9.5 9.2   GFR: Estimated Creatinine Clearance: 113.2 mL/min (by C-G formula based on SCr of 0.79 mg/dL). Recent Labs  Lab 01/19/24 1245 01/20/24 0539  WBC 8.7 8.1    Liver Function Tests: No results for input(s): AST, ALT, ALKPHOS, BILITOT, PROT, ALBUMIN in the last 168 hours. No results for input(s): LIPASE, AMYLASE in the last 168 hours. No results for input(s): AMMONIA in the last 168 hours.  ABG    Component Value Date/Time   HCO3  21.4 08/30/2021 1548   TCO2 22 08/30/2021 1548   O2SAT 91 08/30/2021 1548     Coagulation Profile: No results for input(s): INR, PROTIME in the last 168 hours.  Cardiac Enzymes: No results for input(s): CKTOTAL, CKMB, CKMBINDEX, TROPONINI in the last 168 hours.  HbA1C: No results found for: HGBA1C  CBG: No results for input(s): GLUCAP in the last 168 hours.  Review of Systems:   He denies any current chest pain.  No orthopnea or PND.  Comprehensive review of systems otherwise  negative.  Past Medical History:  He,  has a past medical history of Anxiety (2015), Appendicitis, Arthritis (2000's), Cancer (HCC), COVID (12/2021), Depression, GERD (gastroesophageal reflux disease) (2010), History of kidney stones, transfusion of packed red blood cells (06/23/1972), Hypertension, Lung cancer (HCC) (2023), Non Hodgkin's lymphoma (HCC), Pneumonia, Shortness of breath, Skin cancer (1990's), and Sleep apnea.   Surgical History:   Past Surgical History:  Procedure Laterality Date   ANTERIOR CERVICAL DECOMP/DISCECTOMY FUSION N/A 02/03/2013   Procedure: ACDF C6 - 7     1 LEVEL;  Surgeon: Donaciano Sprang, MD;  Location: [facility] OR;  Service: Orthopedics;  Laterality: N/A;   APPENDECTOMY  1961   BASAL CELL CARCINOMA EXCISION     BRONCHIAL BIOPSY  01/07/2022   Procedure: BRONCHIAL BIOPSIES;  Surgeon: Brenna Adine CROME, DO;  Location: [facility];  Service: Pulmonary;;   BRONCHIAL NEEDLE ASPIRATION BIOPSY  01/07/2022   Procedure: BRONCHIAL NEEDLE ASPIRATION BIOPSIES;  Surgeon: Brenna Adine CROME, DO;  Location: [facility];  Service: Pulmonary;;   CERVICAL FUSION  02/03/2013   Dr Sprang   FRACTURE SURGERY Left 1974   tib/fib/femur   SPINE SURGERY  2014   TESTICLE REMOVAL Left    TONSILLECTOMY       Social History:   reports that he quit smoking about 16 years ago. His smoking use included cigarettes. He started smoking about 53 years ago. He has a 18.5 pack-year smoking history. He has never used smokeless tobacco. He reports current alcohol use. He reports that he does not currently use drugs after having used the following drugs: Marijuana.   Family History:  His family history includes Dementia in his mother; Melanoma in his brother; Mental illness in his mother; Other in his father.   Allergies Allergies  Allergen Reactions   Lisinopril Cough     Home Medications  Prior to Admission medications   Medication Sig Start Date End Date Taking? Authorizing Provider   acetaminophen  (TYLENOL ) 500 MG tablet Take 1,000 mg by mouth every 6 (six) hours as needed for moderate pain.   Yes [provider]  albuterol  (PROVENTIL  HFA;VENTOLIN  HFA) 108 (90 BASE) MCG/ACT inhaler Inhale 2 puffs into the lungs every 4 (four) hours as needed for wheezing or shortness of breath.   Yes [provider]  camphor-menthol  (SARNA) lotion Apply 1 Application topically as needed for itching.   Yes [provider]  carbidopa -levodopa  (SINEMET  IR) 25-100 MG tablet Take 2.5 tablets by mouth 3 (three) times daily. 10/01/20  Yes [provider]  carboxymethylcellulose (REFRESH PLUS) 0.5 % SOLN Place 1 drop into both eyes 4 (four) times daily. 11/02/23  Yes [provider]  famotidine (PEPCID) 40 MG tablet Take 40 mg by mouth 2 (two) times daily. 02/04/22  Yes [provider]  fluticasone  (FLONASE ) 50 MCG/ACT nasal spray Place 2 sprays into the nose daily as needed for rhinitis or allergies.   Yes [provider]  ibuprofen (ADVIL) 800 MG tablet Take  800 mg by mouth every 8 (eight) hours as needed for moderate pain. 08/23/21  Yes [provider]  loratadine (ALLERGY) 10 MG tablet Take 10 mg by mouth daily. [employer]   Yes [provider]  losartan  (COZAAR ) 100 MG tablet Take 50 mg by mouth daily. 10/31/20  Yes [provider]  mirabegron  ER (MYRBETRIQ ) 25 MG TB24 tablet Take 25 mg by mouth daily. 12/03/23  Yes [provider]  Multiple Vitamin (MULTIVITAMIN WITH MINERALS) TABS tablet Take 1 tablet by mouth daily.   Yes [provider]  Omega-3 Fatty Acids (OMEGA 3 PO) Take 600 mg by mouth daily.   Yes [provider]  pantoprazole  (PROTONIX ) 40 MG tablet Take 40 mg by mouth daily.   Yes [provider]  propranolol  (INDERAL ) 20 MG tablet Take 20 mg by mouth 3 (three) times daily.   Yes [provider]  Psyllium (METAMUCIL PO) Take 1 packet by mouth daily.   Yes  [provider]  rasagiline  (AZILECT ) 1 MG TABS tablet TAKE ONE TABLET BY MOUTH ONCE A DAY TO SLOW PARKINSON'S DISEASE - NOT A DOSE CHANGE 10/29/21  Yes [provider]  tamsulosin  (FLOMAX ) 0.4 MG CAPS capsule Take 1 capsule (0.4 mg total) by mouth daily. Patient taking differently: Take 0.4 mg by mouth daily after supper. 12/10/22  Yes Lorren, Amy J, NP  Tiotropium Bromide-Olodaterol (STIOLTO RESPIMAT ) 2.5-2.5 MCG/ACT AERS Inhale 2 puffs into the lungs daily. 01/17/22  Yes Ruthell Lauraine FALCON, NP  ASPIRIN  81 PO Take 1 tablet by mouth daily.    [provider]  oxyCODONE  (ROXICODONE ) 5 MG immediate release tablet Take 1 tablet (5 mg total) by mouth every 6 (six) hours as needed for severe pain. Patient not taking: Reported on 01/19/2024 12/07/22   Dreama Longs, MD  Sp[facility]/A[facility]) [facility] See admin instructions.    [provider]  Tiotropium Bromide-Olodaterol (STIOLTO RESPIMAT ) 2.5-2.5 MCG/ACT AERS Inhale 2 puffs into the lungs daily. Patient not taking: Reported on 01/19/2024 10/03/21   Brenna Adine CROME, DO     Critical care time: n/a    Donnice JONELLE Beals, MD See TRACEY

## 2024-01-20 NOTE — Progress Notes (Signed)
 Assisted with Chest Tube placement.  Patient tolerated well.  Placed to 20cm Suction per orders.  Awaiting PCXR

## 2024-01-20 NOTE — Progress Notes (Signed)
 [facility]                                                                              Chad Knapp, is a 73 y.o. male, DOB - [birth year], FMW:[id number] Admit date - 01/19/2024    Outpatient Primary MD for the patient is Tanda Bleacher, MD  LOS - 0  days  Chief Complaint  Patient presents with   Shortness of Breath       Brief summary   Patient is a [name] with obesity, OSA on CPAP, extranodal pulmonary marginal zone lymphoma, HTN, Parkinson's presented with chest pain. He was recently diagnosed with COVID and started on Paxlovid . Was feeling better, finishing the Paxlovid  soon, but today woke with new chest pain and dyspnea.  Went to his [place] doctor, they did a CXR, saw a right apical pneumo and recommended he come to the hospital. In the ER, patient was hemodynamically stable, saturating well on room air. Chest x-ray showed bilateral opacities and small right apical PTX.  Pulm consulted, recommended conservative mgmt without chest tube and observation.   Assessment & Plan     Pneumothorax - In the setting of marginal zone lymphoma of the lung, recent COVID. - Hemodynamically stable, no worsening hypoxia however chest x-ray this morning showed increased volume of right-sided pneumothorax measuring 5.8 cm in thickness over the right apex compared to 3.5 cm on the previous exam -Pulmonology following, chest tube placed today       COVID-19 virus infection Resolving symptoms at the time of presentation.   - Completed Paxlovid  on 01/19/2024, no further treatment needed - Abx given empirically in the ER, but no fever, leukocytosis to suggest infection, hence antibiotics were discontinued. -Low threshold off antibiotics if start spiking any fevers or worsening leukocytosis       Sleep apnea - Hold CPAP given PTX   Parkinsonism, unspecified (HCC) - Continue rasagiline  and Sinemet  9a,1p,5p   Essential hypertension - BP stable, continue losartan     Marginal zone lymphoma (HCC) Follows with Dr. Onesimo   Thrombocytopenia (HCC) Mild.  Due to lymphoma   Obesity class I Estimated body mass index is 32.12 kg/m as calculated from the following:   Height as of this encounter: 6' 3 (1.905 m).   Weight as of this encounter: 116.6 kg.  Code Status: Full code DVT Prophylaxis:  enoxaparin  (LOVENOX ) injection 40 mg Start: 01/19/24 2200   Level of Care: Level of care: Telemetry Medical Family Communication: Updated patient Disposition Plan:      Remains inpatient appropriate:      Procedures:    Consultants:   Pulmonology  Antimicrobials:   Anti-infectives (From admission, onward)    Start     Dose/Rate Route Frequency Ordered Stop   01/19/24 1430  cefTRIAXone  (ROCEPHIN ) 1 g in sodium chloride  0.9 % 100 mL IVPB        1 g 200 mL/hr over 30 Minutes Intravenous  Once 01/19/24 1421 01/19/24 1600   01/19/24 1430  azithromycin  (ZITHROMAX ) 500 mg in sodium chloride  0.9 % 250 mL IVPB  Status:  Discontinued        500 mg 250  mL/hr over 60 Minutes Intravenous  Once 01/19/24 1421 01/19/24 1956          Medications  arformoterol   15 mcg Nebulization BID   And   umeclidinium bromide   1 puff Inhalation Daily   aspirin   81 mg Oral Daily   carbidopa -levodopa   2.5 tablet Oral TID   enoxaparin  (LOVENOX ) injection  40 mg Subcutaneous Q24H   losartan   50 mg Oral Daily   mirabegron  ER  25 mg Oral Daily   pantoprazole   40 mg Oral Daily   propranolol   20 mg Oral TID   rasagiline   1 mg Oral Daily   tamsulosin   0.4 mg Oral QHS      Subjective:   Chad Knapp was seen and examined today.  No acute complaints this morning, no worsening chest pain shortness of breath, fevers or chills.  On room air.  No acute events overnight. Objective:   Vitals:   01/20/24 0521 01/20/24 0835 01/20/24 0845 01/20/24 1049  BP: (!) 148/86 (!) 155/96  (!) 107/59  Pulse: 79 84    Resp: 20 20  18   Temp: (!) 97.5 F (36.4 C) (!) 97.5 F (36.4 C)     TempSrc: Oral Oral    SpO2: 95% 96% 96% 96%  Weight:      Height:        Intake/Output Summary (Last 24 hours) at 01/20/2024 1239 Last data filed at 01/20/2024 1000 Gross per 24 hour  Intake 240 ml  Output 350 ml  Net -110 ml     Wt Readings from Last 3 Encounters:  01/19/24 116.6 kg  01/13/24 116.6 kg  07/08/23 122.8 kg     Exam General: Alert and oriented x 3, NAD Cardiovascular: S1 S2 auscultated,  RRR Respiratory: Lung sounds diminished Rt>Lt, no wheezing Gastrointestinal: Soft, nontender, nondistended, + bowel sounds Ext: no pedal edema bilaterally Neuro: No new deficits Psych: Normal affect     Data Reviewed:  I have personally reviewed following labs    CBC Lab Results  Component Value Date   WBC 8.1 01/20/2024   RBC 5.07 01/20/2024   HGB 15.7 01/20/2024   HCT 44.5 01/20/2024   MCV 87.8 01/20/2024   MCH 31.0 01/20/2024   PLT 138 (L) 01/20/2024   MCHC 35.3 01/20/2024   RDW 12.9 01/20/2024   LYMPHSABS 1.4 01/19/2024   MONOABS 0.7 01/19/2024   EOSABS 0.2 01/19/2024   BASOSABS 0.0 01/19/2024     Last metabolic panel Lab Results  Component Value Date   NA 131 (L) 01/20/2024   K 3.8 01/20/2024   CL 102 01/20/2024   CO2 20 (L) 01/20/2024   BUN 12 01/20/2024   CREATININE 0.79 01/20/2024   GLUCOSE 109 (H) 01/20/2024   GFRNONAA >60 01/20/2024   GFRAA >60 06/13/2019   CALCIUM 9.2 01/20/2024   PROT 6.8 07/08/2023   ALBUMIN 4.1 07/08/2023   LABGLOB 2.7 07/08/2023   AGRATIO 1.5 07/03/2016   BILITOT 0.4 07/08/2023   ALKPHOS 85 07/08/2023   AST 19 07/08/2023   ALT 25 07/08/2023   ANIONGAP 9 01/20/2024    CBG (last 3)  No results for input(s): GLUCAP in the last 72 hours.    Coagulation Profile: No results for input(s): INR, PROTIME in the last 168 hours.   Radiology Studies: I have personally reviewed the imaging studies  DG CHEST PORT 1 VIEW Result Date: 01/20/2024 CLINICAL DATA:  Chest tube in place. EXAM: PORTABLE CHEST 1 VIEW  COMPARISON:  Same-day chest radiographs dated  01/20/2024 at 6:40 a.m. FINDINGS: Interval placement of right pleural catheter with tip overlying the mid lung. Interval re-expansion of the right lung without definite pneumothorax identified. Streaky bibasilar opacities, more pronounced on the right, favored to reflect atelectasis. No sizable pleural effusion. No acute osseous abnormality. IMPRESSION: 1. Interval placement of right pleural catheter. Re-expansion of the right lung without definite pneumothorax identified. 2. Persistent streaky bibasilar opacities, more pronounced on the right, favored to reflect atelectasis. Electronically Signed   By: Harrietta Sherry M.D.   On: 01/20/2024 11:31   DG Chest 2 View Result Date: 01/20/2024 EXAM: 2 VIEW(S) XRAY OF THE CHEST 01/20/2024 06:40:26 AM COMPARISON: 01/19/2024 CLINICAL HISTORY: Pneumothorax FINDINGS: LUNGS AND PLEURA: Increased volume of right-sided pneumothorax is identified. Measured over the right apex this measures 5.8 cm in thickness compared with 3.5 cm on the previous exam. Unchanged areas of atelectasis within bilateral lower lung zones. HEART AND MEDIASTINUM: Stable cardiomediastinal contours. BONES AND SOFT TISSUES: No acute osseous abnormality. IMPRESSION: 1. Increased volume of right-sided pneumothorax, measuring 5.8 cm in thickness over the right apex compared with 3.5 cm on the previous exam. 2. Unchanged areas of atelectasis within bilateral lower lung zones. 3. Urgent findings will be called by the professional radiology assistance to the ordering physician and will be documented within [facility]. Electronically signed by: Waddell Calk MD 01/20/2024 07:15 AM EDT RP Workstation: [id number]   DG Chest Portable 1 View Addendum Date: 01/19/2024 ADDENDUM REPORT: 01/19/2024 14:04 ADDENDUM: Critical Value/emergent results were called by telephone at the time of interpretation by Dr Duwaine Severs on January 19, 2024 at 11 a.m. to provider Dr. Ruthe who  verbally acknowledged these results. Electronically Signed   By: Megan  Zare M.D.   On: 01/19/2024 14:04   Result Date: 01/19/2024 CLINICAL DATA:  Shortness of breath, COVID infection, history of marginal zone lymphoma of the lungs EXAM: PORTABLE CHEST 1 VIEW COMPARISON:  Chest CT Oct 29, 2022 the FINDINGS: There is paucity of vessels in right lung apex suggestive of pneumothorax with inter pleural distance of 3.5 cm in the right apex. Right mid/lower hemithorax linear bands of atelectasis/consolidation and patchy opacities. Left lower hemithorax band of subsegmental atelectasis/scarring. The heart and mediastinal contour is otherwise unremarkable. No midline shift. Status post ACDF.  No acute osseous abnormality. IMPRESSION: Right apical pneumothorax. Bilateral lower lobe patchy areas of consolidation/atelectasis which may represent infection/inflammation versus malignancy related/posttreatment changes or recurrent disease. Chest CT or PET-CT can be utilized for further assessment if clinically warranted. Electronically Signed: By: Megan  Zare M.D. On: 01/19/2024 13:55       Ibraheem Voris M.D. [facility] 01/20/2024, 12:39 PM  Available via Epic secure chat 7am-7pm After 7 pm, please refer to night coverage provider listed on amion.

## 2024-01-20 NOTE — Plan of Care (Signed)
  Problem: Pain Managment: Goal: General experience of comfort will improve and/or be controlled Outcome: Progressing   Problem: Safety: Goal: Ability to remain free from injury will improve Outcome: Progressing   Problem: Skin Integrity: Goal: Risk for impaired skin integrity will decrease Outcome: Progressing   Problem: Respiratory: Goal: Will maintain a patent airway Outcome: Progressing

## 2024-01-21 ENCOUNTER — Inpatient Hospital Stay (HOSPITAL_COMMUNITY)

## 2024-01-21 DIAGNOSIS — J189 Pneumonia, unspecified organism: Secondary | ICD-10-CM | POA: Diagnosis not present

## 2024-01-21 DIAGNOSIS — J9311 Primary spontaneous pneumothorax: Secondary | ICD-10-CM | POA: Diagnosis not present

## 2024-01-21 DIAGNOSIS — J939 Pneumothorax, unspecified: Secondary | ICD-10-CM | POA: Diagnosis not present

## 2024-01-21 DIAGNOSIS — G20A1 Parkinson's disease without dyskinesia, without mention of fluctuations: Secondary | ICD-10-CM | POA: Diagnosis not present

## 2024-01-21 DIAGNOSIS — U071 COVID-19: Secondary | ICD-10-CM | POA: Diagnosis not present

## 2024-01-21 LAB — CBC
HCT: 46.8 % (ref 39.0–52.0)
Hemoglobin: 16.6 g/dL (ref 13.0–17.0)
MCH: 31.2 pg (ref 26.0–34.0)
MCHC: 35.5 g/dL (ref 30.0–36.0)
MCV: 88 fL (ref 80.0–100.0)
Platelets: 139 K/uL — ABNORMAL LOW (ref 150–400)
RBC: 5.32 MIL/uL (ref 4.22–5.81)
RDW: 12.8 % (ref 11.5–15.5)
WBC: 7.8 K/uL (ref 4.0–10.5)
nRBC: 0 % (ref 0.0–0.2)

## 2024-01-21 LAB — RENAL FUNCTION PANEL
Albumin: 3.5 g/dL (ref 3.5–5.0)
Anion gap: 9 (ref 5–15)
BUN: 12 mg/dL (ref 8–23)
CO2: 24 mmol/L (ref 22–32)
Calcium: 9.6 mg/dL (ref 8.9–10.3)
Chloride: 103 mmol/L (ref 98–111)
Creatinine, Ser: 0.81 mg/dL (ref 0.61–1.24)
GFR, Estimated: >60 mL/min (ref >60)
Glucose, Bld: 94 mg/dL (ref 70–99)
Phosphorus: 3.7 mg/dL (ref 2.5–4.6)
Potassium: 4.4 mmol/L (ref 3.5–5.1)
Sodium: 136 mmol/L (ref 135–145)

## 2024-01-21 MED ORDER — POLYETHYLENE GLYCOL 3350 17 G PO PACK
17.0000 g | PACK | Freq: Every day | ORAL | Status: DC | PRN
  Administered 2024-01-21: 17 g via ORAL
  Filled 2024-01-21: qty 1

## 2024-01-21 MED ORDER — MELATONIN 5 MG PO TABS
5.0000 mg | ORAL_TABLET | Freq: Every day | ORAL | Status: DC
  Administered 2024-01-21: 5 mg via ORAL
  Filled 2024-01-21: qty 1

## 2024-01-21 MED ORDER — POLYETHYLENE GLYCOL 3350 17 G PO PACK: 17.0000 g | PACK | Freq: Every day | ORAL | Status: DC

## 2024-01-21 MED ORDER — BISACODYL 5 MG PO TBEC
5.0000 mg | DELAYED_RELEASE_TABLET | Freq: Every day | ORAL | Status: DC
  Administered 2024-01-21 – 2024-01-22 (×2): 5 mg via ORAL
  Filled 2024-01-21 (×2): qty 1

## 2024-01-21 MED ORDER — BISACODYL 5 MG PO TBEC: 5.0000 mg | DELAYED_RELEASE_TABLET | Freq: Once | ORAL | Status: DC

## 2024-01-21 NOTE — Progress Notes (Signed)
 [facility]                                                                              Chad Knapp, is a 73 y.o. male, DOB - 1951-05-08, FMW:[id number] Admit date - 01/19/2024    Outpatient Primary MD for the patient is Tanda Bleacher, MD  LOS - 1  days  Chief Complaint  Patient presents with   Shortness of Breath       Brief summary   Patient is a [name] with obesity, OSA on CPAP, extranodal pulmonary marginal zone lymphoma, HTN, Parkinson's presented with chest pain. He was recently diagnosed with COVID and started on Paxlovid . Was feeling better, finishing the Paxlovid  soon, but today woke with new chest pain and dyspnea.  Went to his [place] doctor, they did a CXR, saw a right apical pneumo and recommended he come to the hospital. In the ER, patient was hemodynamically stable, saturating well on room air. Chest x-ray showed bilateral opacities and small right apical PTX.  Pulm consulted, recommended conservative mgmt without chest tube and observation.   Assessment & Plan     Pneumothorax - In the setting of marginal zone lymphoma of the lung, recent COVID. - Hemodynamically stable, no worsening hypoxia however chest x-ray this morning showed increased volume of right-sided pneumothorax measuring 5.8 cm in thickness over the right apex compared to 3.5 cm on the previous exam -Pulmonology following, chest tube placed on 7/30, management per allergy       COVID-19 virus infection Resolving symptoms at the time of presentation.   - Completed Paxlovid  on 01/19/2024, no further treatment needed - Abx given empirically in the ER, but no fever, leukocytosis to suggest infection, hence antibiotics were discontinued. -Low threshold off antibiotics if start spiking any fevers or worsening leukocytosis  -Stable, no issues, no hypoxia     Sleep apnea - Hold CPAP given PTX   Parkinsonism, unspecified (HCC) - Continue rasagiline  and Sinemet  9a,1p,5p    Essential hypertension - Current losartan    Marginal zone lymphoma (HCC) Follows with Dr. Onesimo   Thrombocytopenia (HCC) Mild.  Due to lymphoma   Obesity class I Estimated body mass index is 32.12 kg/m as calculated from the following:   Height as of this encounter: 6' 3 (1.905 m).   Weight as of this encounter: 116.6 kg.  Code Status: Full code DVT Prophylaxis:  enoxaparin  (LOVENOX ) injection 40 mg Start: 01/19/24 2200   Level of Care: Level of care: Telemetry Medical Family Communication: Updated patient Disposition Plan:      Remains inpatient appropriate:      Procedures:  Chest tube placement  Consultants:   Pulmonology  Antimicrobials:   Anti-infectives (From admission, onward)    Start     Dose/Rate Route Frequency Ordered Stop   01/19/24 1430  cefTRIAXone  (ROCEPHIN ) 1 g in sodium chloride  0.9 % 100 mL IVPB        1 g 200 mL/hr over 30 Minutes Intravenous  Once 01/19/24 1421 01/19/24 1600   01/19/24 1430  azithromycin  (ZITHROMAX ) 500 mg in sodium chloride  0.9 % 250 mL IVPB  Status:  Discontinued  500 mg 250 mL/hr over 60 Minutes Intravenous  Once 01/19/24 1421 01/19/24 1956          Medications  arformoterol   15 mcg Nebulization BID   And   umeclidinium bromide   1 puff Inhalation Daily   aspirin   81 mg Oral Daily   bisacodyl   5 mg Oral Daily   carbidopa -levodopa   2.5 tablet Oral TID   enoxaparin  (LOVENOX ) injection  40 mg Subcutaneous Q24H   losartan   50 mg Oral Daily   melatonin  5 mg Oral QHS   mirabegron  ER  25 mg Oral Daily   pantoprazole   40 mg Oral Daily   propranolol   20 mg Oral TID   rasagiline   1 mg Oral Daily   sodium chloride  flush  10 mL Intrapleural Q8H   tamsulosin   0.4 mg Oral QHS      Subjective:   Chad Knapp was seen and examined today.  No acute complaints, chest tube placed on 7/30  No chest pain, fever chills, shortness of breath, no acute issues overnight.  Objective:   Vitals:   01/20/24 2045  01/20/24 2133 01/21/24 0619 01/21/24 0700  BP: 114/71 132/84 (!) 136/91 (!) 151/87  Pulse: 84 83 91 86  Resp: 18  17 17   Temp: (!) 97.4 F (36.3 C)  97.6 F (36.4 C) 97.7 F (36.5 C)  TempSrc:   Oral Oral  SpO2: 98%  97%   Weight:      Height:        Intake/Output Summary (Last 24 hours) at 01/21/2024 1334 Last data filed at 01/20/2024 2139 Gross per 24 hour  Intake 20 ml  Output 30 ml  Net -10 ml     Wt Readings from Last 3 Encounters:  01/19/24 116.6 kg  01/13/24 116.6 kg  07/08/23 122.8 kg    Physical Exam General: Alert and oriented x 3, NAD Cardiovascular: S1 S2 clear, RRR.  Respiratory: Diminished breath sounds at the bases, chest tube + Gastrointestinal: Soft, nontender, nondistended, NBS Ext: no pedal edema bilaterally Neuro: no new deficits Psych: Normal affect     Data Reviewed:  I have personally reviewed following labs    CBC Lab Results  Component Value Date   WBC 7.8 01/21/2024   RBC 5.32 01/21/2024   HGB 16.6 01/21/2024   HCT 46.8 01/21/2024   MCV 88.0 01/21/2024   MCH 31.2 01/21/2024   PLT 139 (L) 01/21/2024   MCHC 35.5 01/21/2024   RDW 12.8 01/21/2024   LYMPHSABS 1.4 01/19/2024   MONOABS 0.7 01/19/2024   EOSABS 0.2 01/19/2024   BASOSABS 0.0 01/19/2024     Last metabolic panel Lab Results  Component Value Date   NA 136 01/21/2024   K 4.4 01/21/2024   CL 103 01/21/2024   CO2 24 01/21/2024   BUN 12 01/21/2024   CREATININE 0.81 01/21/2024   GLUCOSE 94 01/21/2024   GFRNONAA >60 01/21/2024   GFRAA >60 06/13/2019   CALCIUM 9.6 01/21/2024   PHOS 3.7 01/21/2024   PROT 6.8 07/08/2023   ALBUMIN 3.5 01/21/2024   LABGLOB 2.7 07/08/2023   AGRATIO 1.5 07/03/2016   BILITOT 0.4 07/08/2023   ALKPHOS 85 07/08/2023   AST 19 07/08/2023   ALT 25 07/08/2023   ANIONGAP 9 01/21/2024    CBG (last 3)  No results for input(s): GLUCAP in the last 72 hours.    Coagulation Profile: No results for input(s): INR, PROTIME in the last 168  hours.   Radiology Studies: I have personally  reviewed the imaging studies  DG CHEST PORT 1 VIEW Result Date: 01/21/2024 CLINICAL DATA:  [id number] Chest tube in place [id number] EXAM: PORTABLE CHEST - 1 VIEW COMPARISON:  January 20, 2024, January 19, 2024 FINDINGS: Similarly positioned small bore right thoracostomy tube, pigtail coiled in the right lung base. Elevation of the left hemidiaphragm with left basilar atelectasis. No focal airspace consolidation, pleural effusion, or pneumothorax. The nodular opacity in the right lung base on the prior x-ray is less conspicuous on today's exam. No cardiomegaly. Cervical fusion hardware again noted. IMPRESSION: Similarly positioned right-sided thoracostomy tube. No pneumothorax. Electronically Signed   By: Rogelia Myers M.D.   On: 01/21/2024 10:30   DG CHEST PORT 1 VIEW Result Date: 01/20/2024 CLINICAL DATA:  Chest tube in place. EXAM: PORTABLE CHEST 1 VIEW COMPARISON:  Same-day chest radiographs dated 01/20/2024 at 6:40 a.m. FINDINGS: Interval placement of right pleural catheter with tip overlying the mid lung. Interval re-expansion of the right lung without definite pneumothorax identified. Streaky bibasilar opacities, more pronounced on the right, favored to reflect atelectasis. No sizable pleural effusion. No acute osseous abnormality. IMPRESSION: 1. Interval placement of right pleural catheter. Re-expansion of the right lung without definite pneumothorax identified. 2. Persistent streaky bibasilar opacities, more pronounced on the right, favored to reflect atelectasis. Electronically Signed   By: Harrietta Sherry M.D.   On: 01/20/2024 11:31   DG Chest 2 View Result Date: 01/20/2024 EXAM: 2 VIEW(S) XRAY OF THE CHEST 01/20/2024 06:40:26 AM COMPARISON: 01/19/2024 CLINICAL HISTORY: Pneumothorax FINDINGS: LUNGS AND PLEURA: Increased volume of right-sided pneumothorax is identified. Measured over the right apex this measures 5.8 cm in thickness compared with 3.5 cm on the  previous exam. Unchanged areas of atelectasis within bilateral lower lung zones. HEART AND MEDIASTINUM: Stable cardiomediastinal contours. BONES AND SOFT TISSUES: No acute osseous abnormality. IMPRESSION: 1. Increased volume of right-sided pneumothorax, measuring 5.8 cm in thickness over the right apex compared with 3.5 cm on the previous exam. 2. Unchanged areas of atelectasis within bilateral lower lung zones. 3. Urgent findings will be called by the professional radiology assistance to the ordering physician and will be documented within [facility]. Electronically signed by: Waddell Calk MD 01/20/2024 07:15 AM EDT RP Workstation: [id number]   DG Chest Portable 1 View Addendum Date: 01/19/2024 ADDENDUM REPORT: 01/19/2024 14:04 ADDENDUM: Critical Value/emergent results were called by telephone at the time of interpretation by Dr Duwaine Severs on January 19, 2024 at 11 a.m. to provider Dr. Ruthe who verbally acknowledged these results. Electronically Signed   By: Megan  Zare M.D.   On: 01/19/2024 14:04   Result Date: 01/19/2024 CLINICAL DATA:  Shortness of breath, COVID infection, history of marginal zone lymphoma of the lungs EXAM: PORTABLE CHEST 1 VIEW COMPARISON:  Chest CT Oct 29, 2022 the FINDINGS: There is paucity of vessels in right lung apex suggestive of pneumothorax with inter pleural distance of 3.5 cm in the right apex. Right mid/lower hemithorax linear bands of atelectasis/consolidation and patchy opacities. Left lower hemithorax band of subsegmental atelectasis/scarring. The heart and mediastinal contour is otherwise unremarkable. No midline shift. Status post ACDF.  No acute osseous abnormality. IMPRESSION: Right apical pneumothorax. Bilateral lower lobe patchy areas of consolidation/atelectasis which may represent infection/inflammation versus malignancy related/posttreatment changes or recurrent disease. Chest CT or PET-CT can be utilized for further assessment if clinically warranted. Electronically  Signed: By: Megan  Zare M.D. On: 01/19/2024 13:55       Kohana Amble M.D. [facility] 01/21/2024, 1:34 PM  Available via [employer]  secure chat 7am-7pm After 7 pm, please refer to night coverage provider listed on amion.

## 2024-01-21 NOTE — Consult Note (Deleted)
 NAME:  Chad Knapp, MRN:  [id number], DOB:  14-Apr-1951, LOS: 1 ADMISSION DATE:  01/19/2024, CONSULTATION DATE:  01/21/24 REFERRING MD:  TRH, CHIEF COMPLAINT:  PTX   History of Present Illness:  73 year old man history of right upper lobe lung nodule turned out to be lymphoma on biopsy here after COVID an acute episode of short chest pain seen at the [place] with chest x-ray revealing pneumothorax.  Usual state of health.  Got sick.  Coughing.  Diagnosed with COVID.  Randomly and for short period time sharp pain in the chest.  Radiated to back.  Went to [place] for evaluation.  Chest x-ray revealed pneumothorax.  He was instructed to present to the ED.  In the ED about 3.5 cm apical pneumothorax, smaller laterally.  Observed overnight.  Largely asymptomatic.  But increase in size.  Prompting chest tube placement 7/30.  Pertinent  Medical History  Lymphoma  Significant Hospital Events: Including procedures, antibiotic start and stop dates in addition to other pertinent events   7/29 admitted with pneumothorax asymptomatic 7/30 enlargement pneumothorax, chest tube placed  Interim History / Subjective:  Lung lung is reinflated on chest x-ray.  Trial waterseal.  Objective    Blood pressure (!) 151/87, pulse 86, temperature 97.7 F (36.5 C), temperature source Oral, resp. rate 17, height 6' 3 (1.905 m), weight 116.6 kg, SpO2 97%.        Intake/Output Summary (Last 24 hours) at 01/21/2024 1452 Last data filed at 01/20/2024 2139 Gross per 24 hour  Intake 20 ml  Output 30 ml  Net -10 ml   Filed Weights   01/19/24 1251  Weight: 116.6 kg    Examination: Deferred  Resolved problem list   Assessment and Plan   Presumed primary spontaneous pneumothorax: In setting of COVID infection, suspect with rapid intrathoracic pressure swings with coughing.  Has increased in size.  Albeit largely asymptomatic.  Did have some pain, acute pain that likely occurred at the time of pneumothorax  development.  But overall doing fine.  Solution of pneumothorax shortly after placement of chest tube based on repeat chest x-ray. -- Chest tube placement at bedside 7/30 -- Chest tube to waterseal, Pete chest x-ray this afternoon and in the morning, if stable would pursue clamping trial -- Daily chest x-ray while chest tube in place  Best Practice (right click and Reselect all SmartList Selections daily)   Per primary Labs   CBC: Recent Labs  Lab 01/19/24 1245 01/20/24 0539 01/21/24 0620  WBC 8.7 8.1 7.8  NEUTROABS 6.3  --   --   HGB 16.9 15.7 16.6  HCT 49.2 44.5 46.8  MCV 90.3 87.8 88.0  PLT 116* 138* 139*    Basic Metabolic Panel: Recent Labs  Lab 01/19/24 1245 01/20/24 0539 01/21/24 0620  NA 136 131* 136  K 4.1 3.8 4.4  CL 105 102 103  CO2 20* 20* 24  GLUCOSE 92 109* 94  BUN 15 12 12   CREATININE 0.86 0.79 0.81  CALCIUM 9.5 9.2 9.6  PHOS  --   --  3.7   GFR: Estimated Creatinine Clearance: 111.8 mL/min (by C-G formula based on SCr of 0.81 mg/dL). Recent Labs  Lab 01/19/24 1245 01/20/24 0539 01/21/24 0620  WBC 8.7 8.1 7.8    Liver Function Tests: Recent Labs  Lab 01/21/24 0620  ALBUMIN 3.5   No results for input(s): LIPASE, AMYLASE in the last 168 hours. No results for input(s): AMMONIA in the last 168 hours.  ABG  Component Value Date/Time   HCO3 21.4 08/30/2021 1548   TCO2 22 08/30/2021 1548   O2SAT 91 08/30/2021 1548     Coagulation Profile: No results for input(s): INR, PROTIME in the last 168 hours.  Cardiac Enzymes: No results for input(s): CKTOTAL, CKMB, CKMBINDEX, TROPONINI in the last 168 hours.  HbA1C: No results found for: HGBA1C  CBG: No results for input(s): GLUCAP in the last 168 hours.  Review of Systems:   N/a  Past Medical History:  He,  has a past medical history of Anxiety (2015), Appendicitis, Arthritis (2000's), Cancer (HCC), COVID (12/2021), Depression, GERD (gastroesophageal reflux  disease) (2010), History of kidney stones, transfusion of packed red blood cells (06/23/1972), Hypertension, Lung cancer (HCC) (2023), Non Hodgkin's lymphoma (HCC), Pneumonia, Shortness of breath, Skin cancer (1990's), and Sleep apnea.   Surgical History:   Past Surgical History:  Procedure Laterality Date   ANTERIOR CERVICAL DECOMP/DISCECTOMY FUSION N/A 02/03/2013   Procedure: ACDF C6 - 7     1 LEVEL;  Surgeon: Donaciano Sprang, MD;  Location: [facility] OR;  Service: Orthopedics;  Laterality: N/A;   APPENDECTOMY  1961   BASAL CELL CARCINOMA EXCISION     BRONCHIAL BIOPSY  01/07/2022   Procedure: BRONCHIAL BIOPSIES;  Surgeon: Brenna Adine CROME, DO;  Location: [facility] ENDOSCOPY;  Service: Pulmonary;;   BRONCHIAL NEEDLE ASPIRATION BIOPSY  01/07/2022   Procedure: BRONCHIAL NEEDLE ASPIRATION BIOPSIES;  Surgeon: Brenna Adine CROME, DO;  Location: [facility] ENDOSCOPY;  Service: Pulmonary;;   CERVICAL FUSION  02/03/2013   Dr Sprang   FRACTURE SURGERY Left 1974   tib/fib/femur   SPINE SURGERY  2014   TESTICLE REMOVAL Left    TONSILLECTOMY       Social History:   reports that he quit smoking about 16 years ago. His smoking use included cigarettes. He started smoking about 53 years ago. He has a 18.5 pack-year smoking history. He has never used smokeless tobacco. He reports current alcohol use. He reports that he does not currently use drugs after having used the following drugs: Marijuana.   Family History:  His family history includes Dementia in his mother; Melanoma in his brother; Mental illness in his mother; Other in his father.   Allergies Allergies  Allergen Reactions   Lisinopril Cough     Home Medications  Prior to Admission medications   Medication Sig Start Date End Date Taking? Authorizing Provider  acetaminophen  (TYLENOL ) 500 MG tablet Take 1,000 mg by mouth every 6 (six) hours as needed for moderate pain.   Yes [provider]  albuterol  (PROVENTIL  HFA;VENTOLIN  HFA) 108 (90 BASE) MCG/ACT  inhaler Inhale 2 puffs into the lungs every 4 (four) hours as needed for wheezing or shortness of breath.   Yes [provider]  camphor-menthol  (SARNA) lotion Apply 1 Application topically as needed for itching.   Yes [provider]  carbidopa -levodopa  (SINEMET  IR) 25-100 MG tablet Take 2.5 tablets by mouth 3 (three) times daily. 10/01/20  Yes [provider]  carboxymethylcellulose (REFRESH PLUS) 0.5 % SOLN Place 1 drop into both eyes 4 (four) times daily. 11/02/23  Yes [provider]  famotidine (PEPCID) 40 MG tablet Take 40 mg by mouth 2 (two) times daily. 02/04/22  Yes [provider]  fluticasone  (FLONASE ) 50 MCG/ACT nasal spray Place 2 sprays into the nose daily as needed for rhinitis or allergies.   Yes [provider]  ibuprofen (ADVIL) 800 MG tablet Take 800 mg by mouth every 8 (eight) hours as needed for  moderate pain. 08/23/21  Yes [provider]  loratadine (ALLERGY) 10 MG tablet Take 10 mg by mouth daily. [employer] brand   Yes [provider]  losartan  (COZAAR ) 100 MG tablet Take 50 mg by mouth daily. 10/31/20  Yes [provider]  mirabegron  ER (MYRBETRIQ ) 25 MG TB24 tablet Take 25 mg by mouth daily. 12/03/23  Yes [provider]  Multiple Vitamin (MULTIVITAMIN WITH MINERALS) TABS tablet Take 1 tablet by mouth daily.   Yes [provider]  Omega-3 Fatty Acids (OMEGA 3 PO) Take 600 mg by mouth daily.   Yes [provider]  pantoprazole  (PROTONIX ) 40 MG tablet Take 40 mg by mouth daily.   Yes [provider]  propranolol  (INDERAL ) 20 MG tablet Take 20 mg by mouth 3 (three) times daily.   Yes [provider]  Psyllium (METAMUCIL PO) Take 1 packet by mouth daily.   Yes [provider]  rasagiline  (AZILECT ) 1 MG TABS tablet TAKE ONE TABLET BY MOUTH ONCE A DAY TO SLOW PARKINSON'S DISEASE - NOT A DOSE CHANGE 10/29/21  Yes [provider]  tamsulosin   (FLOMAX ) 0.4 MG CAPS capsule Take 1 capsule (0.4 mg total) by mouth daily. Patient taking differently: Take 0.4 mg by mouth daily after supper. 12/10/22  Yes Lorren, Amy J, NP  Tiotropium Bromide-Olodaterol (STIOLTO RESPIMAT ) 2.5-2.5 MCG/ACT AERS Inhale 2 puffs into the lungs daily. 01/17/22  Yes Ruthell Lauraine FALCON, NP  ASPIRIN  81 PO Take 1 tablet by mouth daily.    [provider]  oxyCODONE  (ROXICODONE ) 5 MG immediate release tablet Take 1 tablet (5 mg total) by mouth every 6 (six) hours as needed for severe pain. Patient not taking: Reported on 01/19/2024 12/07/22   Dreama Longs, MD  [facility] [facility]) [facility] See admin instructions.    [provider]  Tiotropium Bromide-Olodaterol (STIOLTO RESPIMAT ) 2.5-2.5 MCG/ACT AERS Inhale 2 puffs into the lungs daily. Patient not taking: Reported on 01/19/2024 10/03/21   Brenna Adine CROME, DO     Critical care time: n/a    Donnice JONELLE Beals, MD See TRACEY

## 2024-01-21 NOTE — Plan of Care (Signed)
  Problem: Education: Goal: Knowledge of General Education information will improve Description: Including pain rating scale, medication(s)/side effects and non-pharmacologic comfort measures Outcome: Progressing   Problem: Health Behavior/Discharge Planning: Goal: Ability to manage health-related needs will improve Outcome: Progressing   Problem: Clinical Measurements: Goal: Ability to maintain clinical measurements within normal limits will improve Outcome: Progressing Goal: Will remain free from infection Outcome: Progressing Goal: Diagnostic test results will improve Outcome: Progressing Goal: Respiratory complications will improve Outcome: Progressing Goal: Cardiovascular complication will be avoided Outcome: Progressing   Problem: Activity: Goal: Risk for activity intolerance will decrease Outcome: Progressing   Problem: Nutrition: Goal: Adequate nutrition will be maintained Outcome: Progressing   Problem: Coping: Goal: Level of anxiety will decrease Outcome: Progressing   Problem: Elimination: Goal: Will not experience complications related to bowel motility Outcome: Progressing Goal: Will not experience complications related to urinary retention Outcome: Progressing   Problem: Pain Managment: Goal: General experience of comfort will improve and/or be controlled Outcome: Progressing   Problem: Safety: Goal: Ability to remain free from injury will improve Outcome: Progressing   Problem: Skin Integrity: Goal: Risk for impaired skin integrity will decrease Outcome: Progressing   Problem: Education: Goal: Knowledge of risk factors and measures for prevention of condition will improve Outcome: Progressing   Problem: Coping: Goal: Psychosocial and spiritual needs will be supported Outcome: Progressing   Problem: Respiratory: Goal: Will maintain a patent airway Outcome: Progressing Goal: Complications related to the disease process, condition or treatment  will be avoided or minimized Outcome: Progressing

## 2024-01-21 NOTE — Progress Notes (Signed)
 NAME:  Chad Knapp, MRN:  [id number], DOB:  [birth year], LOS: 1 ADMISSION DATE:  01/19/2024, CONSULTATION DATE:  01/21/24 REFERRING MD:  TRH, CHIEF COMPLAINT:  PTX   History of Present Illness:  73 year old man history of right upper lobe lung nodule turned out to be lymphoma on biopsy here after COVID an acute episode of short chest pain seen at the [place] with chest x-ray revealing pneumothorax.  Usual state of health.  Got sick.  Coughing.  Diagnosed with COVID.  Randomly and for short period time sharp pain in the chest.  Radiated to back.  Went to [place] for evaluation.  Chest x-ray revealed pneumothorax.  He was instructed to present to the ED.  In the ED about 3.5 cm apical pneumothorax, smaller laterally.  Observed overnight.  Largely asymptomatic.  But increase in size.  Prompting chest tube placement 7/30.  Pertinent  Medical History  Lymphoma  Significant Hospital Events: Including procedures, antibiotic start and stop dates in addition to other pertinent events   7/29 admitted with pneumothorax asymptomatic 7/30 enlargement pneumothorax, chest tube placed 7/31 chest tube to water seal  Interim History / Subjective:  Pt feeling improved, has some pain around the chest tube site   Objective    Blood pressure (!) 151/87, pulse 86, temperature 97.7 F (36.5 C), temperature source Oral, resp. rate 17, height 6' 3 (1.905 m), weight 116.6 kg, SpO2 97%.        Intake/Output Summary (Last 24 hours) at 01/21/2024 1214 Last data filed at 01/20/2024 2139 Gross per 24 hour  Intake 20 ml  Output 30 ml  Net -10 ml   Filed Weights   01/19/24 1251  Weight: 116.6 kg    Examination: General: Sitting up in the bed in no acute distress HENT: moist mm, sclera anicterc Lungs: Clear bilaterally, R apical CT in place without air leak Cardiovascular: Regular rate and rhythm Abdomen: Nondistended Extremities: Edema noted, significant scarring over left shin Neuro: alert and  oriented  Resolved problem list   Assessment and Plan   Presumed primary spontaneous pneumothorax: In setting of COVID infection Suspect with rapid intrathoracic pressure swings with coughing.  Has increased in size.  Albeit largely asymptomatic.  Did have some pain, acute pain that likely occurred at the time of pneumothorax development.  But overall doing fine.  Given increased size chest tube was placed on 7/30 -chest tube placed to water seal, CXR in a couple of hours - Daily chest x-ray while chest tube in place -If no enlargement tomorrow on CXR then consider CT removal  Best Practice (right click and Reselect all SmartList Selections daily)   Per primary Labs   CBC: Recent Labs  Lab 01/19/24 1245 01/20/24 0539 01/21/24 0620  WBC 8.7 8.1 7.8  NEUTROABS 6.3  --   --   HGB 16.9 15.7 16.6  HCT 49.2 44.5 46.8  MCV 90.3 87.8 88.0  PLT 116* 138* 139*    Basic Metabolic Panel: Recent Labs  Lab 01/19/24 1245 01/20/24 0539 01/21/24 0620  NA 136 131* 136  K 4.1 3.8 4.4  CL 105 102 103  CO2 20* 20* 24  GLUCOSE 92 109* 94  BUN 15 12 12   CREATININE 0.86 0.79 0.81  CALCIUM 9.5 9.2 9.6  PHOS  --   --  3.7   GFR: Estimated Creatinine Clearance: 111.8 mL/min (by C-G formula based on SCr of 0.81 mg/dL). Recent Labs  Lab 01/19/24 1245 01/20/24 0539 01/21/24 0620  WBC 8.7 8.1  7.8    Liver Function Tests: Recent Labs  Lab 01/21/24 0620  ALBUMIN 3.5   No results for input(s): LIPASE, AMYLASE in the last 168 hours. No results for input(s): AMMONIA in the last 168 hours.  ABG    Component Value Date/Time   HCO3 21.4 08/30/2021 1548   TCO2 22 08/30/2021 1548   O2SAT 91 08/30/2021 1548     Coagulation Profile: No results for input(s): INR, PROTIME in the last 168 hours.  Cardiac Enzymes: No results for input(s): CKTOTAL, CKMB, CKMBINDEX, TROPONINI in the last 168 hours.  HbA1C: No results found for: HGBA1C  CBG: No results for  input(s): GLUCAP in the last 168 hours.  Review of Systems:   He denies any current chest pain.  No orthopnea or PND.  Comprehensive review of systems otherwise negative.  Past Medical History:  He,  has a past medical history of Anxiety (2015), Appendicitis, Arthritis (2000's), Cancer (HCC), COVID (12/2021), Depression, GERD (gastroesophageal reflux disease) (2010), History of kidney stones, transfusion of packed red blood cells (06/23/1972), Hypertension, Lung cancer (HCC) (2023), Non Hodgkin's lymphoma (HCC), Pneumonia, Shortness of breath, Skin cancer (1990's), and Sleep apnea.   Surgical History:   Past Surgical History:  Procedure Laterality Date   ANTERIOR CERVICAL DECOMP/DISCECTOMY FUSION N/A 02/03/2013   Procedure: ACDF C6 - 7     1 LEVEL;  Surgeon: Donaciano Sprang, MD;  Location: [facility] OR;  Service: Orthopedics;  Laterality: N/A;   APPENDECTOMY  1961   BASAL CELL CARCINOMA EXCISION     BRONCHIAL BIOPSY  01/07/2022   Procedure: BRONCHIAL BIOPSIES;  Surgeon: Brenna Adine CROME, DO;  Location: [facility];  Service: Pulmonary;;   BRONCHIAL NEEDLE ASPIRATION BIOPSY  01/07/2022   Procedure: BRONCHIAL NEEDLE ASPIRATION BIOPSIES;  Surgeon: Brenna Adine CROME, DO;  Location: [facility];  Service: Pulmonary;;   CERVICAL FUSION  02/03/2013   Dr Sprang   FRACTURE SURGERY Left 1974   tib/fib/femur   SPINE SURGERY  2014   TESTICLE REMOVAL Left    TONSILLECTOMY       Social History:   reports that he quit smoking about 16 years ago. His smoking use included cigarettes. He started smoking about 53 years ago. He has a 18.5 pack-year smoking history. He has never used smokeless tobacco. He reports current alcohol use. He reports that he does not currently use drugs after having used the following drugs: Marijuana.   Family History:  His family history includes Dementia in his mother; Melanoma in his brother; Mental illness in his mother; Other in his father.   Allergies Allergies  Allergen  Reactions   Lisinopril Cough     Home Medications  Prior to Admission medications   Medication Sig Start Date End Date Taking? Authorizing Provider  acetaminophen  (TYLENOL ) 500 MG tablet Take 1,000 mg by mouth every 6 (six) hours as needed for moderate pain.   Yes [provider]  albuterol  (PROVENTIL  HFA;VENTOLIN  HFA) 108 (90 BASE) MCG/ACT inhaler Inhale 2 puffs into the lungs every 4 (four) hours as needed for wheezing or shortness of breath.   Yes [provider]  camphor-menthol  VIKKI) lotion Apply 1 Application topically as needed for itching.   Yes [provider]  carbidopa -levodopa  (SINEMET  IR) 25-100 MG tablet Take 2.5 tablets by mouth 3 (three) times daily. 10/01/20  Yes [provider]  carboxymethylcellulose (REFRESH PLUS) 0.5 % SOLN Place 1 drop into both eyes 4 (four) times daily. 11/02/23  Yes [provider]  famotidine (PEPCID) 40  MG tablet Take 40 mg by mouth 2 (two) times daily. 02/04/22  Yes [provider]  fluticasone  (FLONASE ) 50 MCG/ACT nasal spray Place 2 sprays into the nose daily as needed for rhinitis or allergies.   Yes [provider]  ibuprofen (ADVIL) 800 MG tablet Take 800 mg by mouth every 8 (eight) hours as needed for moderate pain. 08/23/21  Yes [provider]  loratadine (ALLERGY) 10 MG tablet Take 10 mg by mouth daily. [employer] brand   Yes [provider]  losartan  (COZAAR ) 100 MG tablet Take 50 mg by mouth daily. 10/31/20  Yes [provider]  mirabegron  ER (MYRBETRIQ ) 25 MG TB24 tablet Take 25 mg by mouth daily. 12/03/23  Yes [provider]  Multiple Vitamin (MULTIVITAMIN WITH MINERALS) TABS tablet Take 1 tablet by mouth daily.   Yes [provider]  Omega-3 Fatty Acids (OMEGA 3 PO) Take 600 mg by mouth daily.   Yes [provider]  pantoprazole  (PROTONIX ) 40 MG tablet Take 40 mg by mouth daily.   Yes [provider]  propranolol   (INDERAL ) 20 MG tablet Take 20 mg by mouth 3 (three) times daily.   Yes [provider]  Psyllium (METAMUCIL PO) Take 1 packet by mouth daily.   Yes [provider]  rasagiline  (AZILECT ) 1 MG TABS tablet TAKE ONE TABLET BY MOUTH ONCE A DAY TO SLOW PARKINSON'S DISEASE - NOT A DOSE CHANGE 10/29/21  Yes [provider]  tamsulosin  (FLOMAX ) 0.4 MG CAPS capsule Take 1 capsule (0.4 mg total) by mouth daily. Patient taking differently: Take 0.4 mg by mouth daily after supper. 12/10/22  Yes Lorren, Amy J, NP  Tiotropium Bromide-Olodaterol (STIOLTO RESPIMAT ) 2.5-2.5 MCG/ACT AERS Inhale 2 puffs into the lungs daily. 01/17/22  Yes Ruthell Lauraine FALCON, NP  ASPIRIN  81 PO Take 1 tablet by mouth daily.    [provider]  oxyCODONE  (ROXICODONE ) 5 MG immediate release tablet Take 1 tablet (5 mg total) by mouth every 6 (six) hours as needed for severe pain. Patient not taking: Reported on 01/19/2024 12/07/22   Dreama Longs, MD  [facility]) [facility] See admin instructions.    [provider]  Tiotropium Bromide-Olodaterol (STIOLTO RESPIMAT ) 2.5-2.5 MCG/ACT AERS Inhale 2 puffs into the lungs daily. Patient not taking: Reported on 01/19/2024 10/03/21   Brenna Adine CROME, DO     Critical care time: n/a    Leita SAUNDERS Darrel Gloss, PA-C  Pulmonary & Critical care See Amion for pager If no response to pager , please call [phone] until 7pm After 7:00 pm call Elink  [phone]

## 2024-01-22 ENCOUNTER — Inpatient Hospital Stay (HOSPITAL_COMMUNITY)

## 2024-01-22 DIAGNOSIS — U071 COVID-19: Secondary | ICD-10-CM | POA: Diagnosis not present

## 2024-01-22 DIAGNOSIS — J189 Pneumonia, unspecified organism: Secondary | ICD-10-CM | POA: Diagnosis not present

## 2024-01-22 DIAGNOSIS — J9383 Other pneumothorax: Principal | ICD-10-CM

## 2024-01-22 DIAGNOSIS — J939 Pneumothorax, unspecified: Secondary | ICD-10-CM | POA: Diagnosis not present

## 2024-01-22 LAB — RENAL FUNCTION PANEL
Albumin: 3.5 g/dL (ref 3.5–5.0)
Anion gap: 9 (ref 5–15)
BUN: 15 mg/dL (ref 8–23)
CO2: 26 mmol/L (ref 22–32)
Calcium: 9.8 mg/dL (ref 8.9–10.3)
Chloride: 101 mmol/L (ref 98–111)
Creatinine, Ser: 0.99 mg/dL (ref 0.61–1.24)
GFR, Estimated: >60 mL/min (ref >60)
Glucose, Bld: 99 mg/dL (ref 70–99)
Phosphorus: 3.6 mg/dL (ref 2.5–4.6)
Potassium: 5 mmol/L (ref 3.5–5.1)
Sodium: 136 mmol/L (ref 135–145)

## 2024-01-22 LAB — CBC
HCT: 48.1 % (ref 39.0–52.0)
Hemoglobin: 16.7 g/dL (ref 13.0–17.0)
MCH: 30.9 pg (ref 26.0–34.0)
MCHC: 34.7 g/dL (ref 30.0–36.0)
MCV: 88.9 fL (ref 80.0–100.0)
Platelets: 155 K/uL (ref 150–400)
RBC: 5.41 MIL/uL (ref 4.22–5.81)
RDW: 12.8 % (ref 11.5–15.5)
WBC: 8 K/uL (ref 4.0–10.5)
nRBC: 0 % (ref 0.0–0.2)

## 2024-01-22 MED ORDER — POLYETHYLENE GLYCOL 3350 17 G PO PACK: 17.0000 g | PACK | Freq: Every day | ORAL | Status: DC

## 2024-01-22 MED ORDER — LACTULOSE 10 GM/15ML PO SOLN
20.0000 g | Freq: Once | ORAL | Status: AC
  Administered 2024-01-22: 20 g via ORAL
  Filled 2024-01-22: qty 30

## 2024-01-22 NOTE — Progress Notes (Signed)
 Patient has cane and all other belongings.

## 2024-01-22 NOTE — Progress Notes (Signed)
 Right pleural chest tube d/c per order. Dressing CDI. O2 stable throughout 95% RA. CXR order already placed.

## 2024-01-22 NOTE — Discharge Summary (Signed)
 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 with obesity, OSA on CPAP, extranodal pulmonary marginal zone lymphoma, HTN, Parkinson's presented with chest pain. He was recently diagnosed with COVID and started on Paxlovid . Was feeling better, finishing the Paxlovid  soon, but today woke with new chest pain and dyspnea.  Went to his [place] doctor, they did a CXR, saw a right apical pneumo and recommended he come to the hospital. In the ER, patient was hemodynamically stable, saturating well on room air. Chest x-ray showed bilateral opacities and small right apical PTX.  Pulm consulted, recommended conservative mgmt without chest tube and observation.  Assessment and Plan: Pneumothorax - In the setting of marginal zone lymphoma of the lung, recent COVID. - Hemodynamically stable, no worsening hypoxia however chest x-ray this morning showed increased volume of right-sided pneumothorax measuring 5.8 cm in thickness over the right apex compared to 3.5 cm on the previous exam -Pulmonology following, chest tube was placed on 7/30 - Chest tube removed today, repeat CXR done in 4 hours stable.  -cleared by pulmonology to discharge home.  He was recommended recommended no CPAP for for next 72 hours, then can resume home settings after that       COVID-19 virus infection Resolving symptoms at the time of presentation.   - Completed Paxlovid  on 01/19/2024, no further  treatment needed - Abx given empirically in the ER, but no fever, leukocytosis to suggest infection, hence antibiotics were discontinued. - Stable, no issues      Sleep apnea - Hold CPAP given PTX - recommended no CPAP for for next 72 hours, then can resume home settings after that   Parkinsonism, unspecified (HCC) - Continue rasagiline  and Sinemet  9a,1p,5p   Essential hypertension - Current losartan      Marginal zone lymphoma (HCC) Follows with Dr. 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   Weight as of this encounter: 116.6 kg.     Pain control - [place]  Controlled Substance Reporting System database was reviewed. and patient was instructed, not to drive, operate heavy machinery, perform activities at heights, swimming or participation in water activities or provide baby-sitting services while on Pain, Sleep and Anxiety Medications; until their outpatient Physician has advised to do so again. Also recommended to not to take more than prescribed Pain, Sleep and Anxiety Medications.  Consultants: Pulmonology Procedures performed: Chest tube placement Disposition: Home Diet recommendation:  Discharge Diet Orders (From admission, onward)     Start     Ordered   01/22/24 0000  Diet - low sodium heart healthy        01/22/24 1200            DISCHARGE MEDICATION: Allergies as of 01/22/2024  Reactions   Lisinopril Cough        Medication List     TAKE these medications    acetaminophen  500 MG tablet Commonly known as: TYLENOL  Take 1,000 mg by mouth every 6 (six) hours as needed for moderate pain.   albuterol  108 (90 Base) MCG/ACT inhaler Commonly known as: VENTOLIN  HFA Inhale 2 puffs into the lungs every 4 (four) hours as needed for wheezing or shortness of breath.   Allergy 10 MG  tablet Generic drug: loratadine Take 10 mg by mouth daily. [employer]   ASPIRIN  81 PO Take 1 tablet by mouth daily.   camphor-menthol  lotion Commonly known as: SARNA Apply 1 Application topically as needed for itching.   carbidopa -levodopa  25-100 MG tablet Commonly known as: SINEMET  IR Take 2.5 tablets by mouth 3 (three) times daily.   carboxymethylcellulose 0.5 % Soln Commonly known as: REFRESH PLUS Place 1 drop into both eyes 4 (four) times daily.   famotidine 40 MG tablet Commonly known as: PEPCID Take 40 mg by mouth 2 (two) times daily.   fluticasone  50 MCG/ACT nasal spray Commonly known as: FLONASE  Place 2 sprays into the nose daily as needed for rhinitis or allergies.   ibuprofen 800 MG tablet Commonly known as: ADVIL Take 800 mg by mouth every 8 (eight) hours as needed for moderate pain.   losartan  100 MG tablet Commonly known as: COZAAR  Take 50 mg by mouth daily.   METAMUCIL PO Take 1 packet by mouth daily.   mirabegron  ER 25 MG Tb24 tablet Commonly known as: MYRBETRIQ  Take 25 mg by mouth daily.   multivitamin with minerals Tabs tablet Take 1 tablet by mouth daily.   OMEGA 3 PO Take 600 mg by mouth daily.   OptiChamber Diamond Devi See admin instructions.   pantoprazole  40 MG tablet Commonly known as: PROTONIX  Take 40 mg by mouth daily.   propranolol  20 MG tablet Commonly known as: INDERAL  Take 20 mg by mouth 3 (three) times daily.   rasagiline  1 MG Tabs tablet Commonly known as: AZILECT  TAKE ONE TABLET BY MOUTH ONCE A DAY TO SLOW PARKINSON'S DISEASE - NOT A DOSE CHANGE   Stiolto Respimat  2.5-2.5 MCG/ACT Aers Generic drug: Tiotropium Bromide-Olodaterol Inhale 2 puffs into the lungs daily.   tamsulosin  0.4 MG Caps capsule Commonly known as: FLOMAX  Take 1 capsule (0.4 mg total) by mouth daily. What changed: when to take this               Discharge Care Instructions  (From admission, onward)           Start     Ordered    01/22/24 0000  If the dressing is still on your incision site when you go home, remove it on the third day after your surgery date. Remove dressing if it begins to fall off, or if it is dirty or damaged before the third day.        01/22/24 1200            Follow-up Information     Tanda Bleacher, MD Follow up in 2 week(s).   Specialty: Family Medicine Why: for hospital follow-up Contact information: [address] [place] [place] [place] [address] [phone]                Discharge Exam: Fredricka Weights   01/19/24 1251  Weight: 116.6 kg   S: Cleared by pulmonology to discharge home.  No acute chest pain, shortness of breath, fevers or chills.  O2 sats remained good on home O2 evaluation.  BP (!) 141/80 (BP Location: Right Arm)   Pulse 79   Temp 97.6 F (36.4 C) (Oral)   Resp 16   Ht 6' 3 (1.905 m)   Wt 116.6 kg   SpO2 97%   BMI 32.12 kg/m   Physical Exam General: Alert and oriented x 3, NAD Cardiovascular: S1 S2 clear, RRR.  Respiratory: CTAB, no wheezing, rales or rhonchi Gastrointestinal: Soft, nontender, nondistended, NBS Ext: no pedal edema bilaterally Neuro: no new deficits Psych: Normal affect    Condition at discharge: fair  The results of significant diagnostics from this hospitalization (including imaging, microbiology, ancillary and laboratory) are listed below for reference.   Imaging Studies: DG CHEST PORT 1 VIEW Result Date: 01/22/2024 CLINICAL DATA:  [id number] Pneumothorax [id number] EXAM: PORTABLE CHEST - 1 VIEW COMPARISON:  January 22, 2024 FINDINGS: Elevation of the left hemidiaphragm with streaky left basilar atelectasis. No new airspace consolidation or pleural effusion. Interval removal of the right-sided thoracostomy tube. No pneumothorax. No cardiomegaly. Tortuous aorta with aortic atherosclerosis. No acute fracture or destructive lesions. Multilevel thoracic osteophytosis. IMPRESSION: 1. Interval removal of the right-sided thoracostomy  tube. No pneumothorax. 2. Similar left basilar atelectasis. Electronically Signed   By: Rogelia Myers M.D.   On: 01/22/2024 16:06   DG CHEST PORT 1 VIEW Result Date: 01/22/2024 CLINICAL DATA:  Pneumothorax EXAM: PORTABLE CHEST 1 VIEW COMPARISON:  Chest x-ray performed January 21, 2024 FINDINGS: A right-sided pigtail catheter is present in similar orientation. Bibasilar airspace consolidation is similar. No definite pneumothorax. IMPRESSION: 1. Right chest tube in place, no definite pneumothorax. Electronically Signed   By: Maude Naegeli M.D.   On: 01/22/2024 08:33   DG CHEST PORT 1 VIEW Result Date: 01/21/2024 CLINICAL DATA:  Chest tube placement EXAM: PORTABLE CHEST 1 VIEW COMPARISON:  01/21/2024 FINDINGS: RIGHT chest tube unchanged. No pneumothorax. Elevation LEFT hemidiaphragm. LEFT lung clear. Anterior cervical fusion. IMPRESSION: 1. No interval change. 2. RIGHT chest tube in place.  No pneumothorax. Electronically Signed   By: Jackquline Boxer M.D.   On: 01/21/2024 16:22   DG CHEST PORT 1 VIEW Result Date: 01/21/2024 CLINICAL DATA:  [id number] Chest tube in place [id number] EXAM: PORTABLE CHEST - 1 VIEW COMPARISON:  January 20, 2024, January 19, 2024 FINDINGS: Similarly positioned small bore right thoracostomy tube, pigtail coiled in the right lung base. Elevation of the left hemidiaphragm with left basilar atelectasis. No focal airspace consolidation, pleural effusion, or pneumothorax. The nodular opacity in the right lung base on the prior x-ray is less conspicuous on today's exam. No cardiomegaly. Cervical fusion hardware again noted. IMPRESSION: Similarly positioned right-sided thoracostomy tube. No pneumothorax. Electronically Signed   By: Rogelia Myers M.D.   On: 01/21/2024 10:30   DG CHEST PORT 1 VIEW Result Date: 01/20/2024 CLINICAL DATA:  Chest tube in place. EXAM: PORTABLE CHEST 1 VIEW COMPARISON:  Same-day chest radiographs dated 01/20/2024 at 6:40 a.m. FINDINGS: Interval placement of right pleural  catheter with tip overlying the mid lung. Interval re-expansion of the right lung without definite pneumothorax identified. Streaky bibasilar opacities, more pronounced on the right, favored to reflect atelectasis. No sizable pleural effusion. No acute osseous abnormality. IMPRESSION: 1. Interval placement of right pleural catheter. Re-expansion of the right lung without definite pneumothorax identified. 2. Persistent streaky bibasilar opacities, more pronounced on the right, favored to reflect atelectasis. Electronically Signed   By: Harrietta Sherry M.D.   On: 01/20/2024 11:31   DG Chest 2 View  Result Date: 01/20/2024 EXAM: 2 VIEW(S) XRAY OF THE CHEST 01/20/2024 06:40:26 AM COMPARISON: 01/19/2024 CLINICAL HISTORY: Pneumothorax FINDINGS: LUNGS AND PLEURA: Increased volume of right-sided pneumothorax is identified. Measured over the right apex this measures 5.8 cm in thickness compared with 3.5 cm on the previous exam. Unchanged areas of atelectasis within bilateral lower lung zones. HEART AND MEDIASTINUM: Stable cardiomediastinal contours. BONES AND SOFT TISSUES: No acute osseous abnormality. IMPRESSION: 1. Increased volume of right-sided pneumothorax, measuring 5.8 cm in thickness over the right apex compared with 3.5 cm on the previous exam. 2. Unchanged areas of atelectasis within bilateral lower lung zones. 3. Urgent findings will be called by the professional radiology assistance to the ordering physician and will be documented within [facility]. Electronically signed by: Waddell Calk MD 01/20/2024 07:15 AM EDT RP Workstation: [id number]   DG Chest Portable 1 View Addendum Date: 01/19/2024 ADDENDUM REPORT: 01/19/2024 14:04 ADDENDUM: Critical Value/emergent results were called by telephone at the time of interpretation by Dr Duwaine Severs on January 19, 2024 at 11 a.m. to provider Dr. Ruthe who verbally acknowledged these results. Electronically Signed   By: Megan  Zare M.D.   On: 01/19/2024 14:04   Result  Date: 01/19/2024 CLINICAL DATA:  Shortness of breath, COVID infection, history of marginal zone lymphoma of the lungs EXAM: PORTABLE CHEST 1 VIEW COMPARISON:  Chest CT Oct 29, 2022 the FINDINGS: There is paucity of vessels in right lung apex suggestive of pneumothorax with inter pleural distance of 3.5 cm in the right apex. Right mid/lower hemithorax linear bands of atelectasis/consolidation and patchy opacities. Left lower hemithorax band of subsegmental atelectasis/scarring. The heart and mediastinal contour is otherwise unremarkable. No midline shift. Status post ACDF.  No acute osseous abnormality. IMPRESSION: Right apical pneumothorax. Bilateral lower lobe patchy areas of consolidation/atelectasis which may represent infection/inflammation versus malignancy related/posttreatment changes or recurrent disease. Chest CT or PET-CT can be utilized for further assessment if clinically warranted. Electronically Signed: By: Megan  Zare M.D. On: 01/19/2024 13:55    Microbiology: Results for orders placed or performed during the hospital encounter of 01/19/24  Blood culture (routine x 2)     Status: None (Preliminary result)   Collection Time: 01/19/24  3:39 PM   Specimen: BLOOD  Result Value Ref Range Status   Specimen Description BLOOD SITE NOT SPECIFIED  Final   Special Requests   Final    BOTTLES DRAWN AEROBIC AND ANAEROBIC Blood Culture results may not be optimal due to an inadequate volume of blood received in culture bottles   Culture   Final    NO GROWTH 3 DAYS Performed at [facility], [address] [place], [place] [address]    Report Status PENDING  Incomplete  Blood culture (routine x 2)     Status: None (Preliminary result)   Collection Time: 01/19/24  9:02 PM   Specimen: BLOOD  Result Value Ref Range Status   Specimen Description BLOOD SITE NOT SPECIFIED  Final   Special Requests   Final    BOTTLES DRAWN AEROBIC AND ANAEROBIC Blood Culture adequate volume   Culture   Final     NO GROWTH 3 DAYS Performed at [facility], [address] [place], [place] [address]    Report Status PENDING  Incomplete  SARS Coronavirus 2 by RT PCR (hospital order, performed in [facility] hospital lab) *cepheid single result test* Anterior Nasal Swab     Status: Abnormal   Collection Time: 01/19/24 11:50 PM   Specimen: Anterior  Nasal Swab  Result Value Ref Range Status   SARS Coronavirus 2 by RT PCR POSITIVE (A) NEGATIVE Final    Comment: Performed at [facility], [address] [place], [place] [address]    Labs: CBC: Recent Labs  Lab 01/19/24 1245 01/20/24 0539 01/21/24 0620 01/22/24 0701  WBC 8.7 8.1 7.8 8.0  NEUTROABS 6.3  --   --   --   HGB 16.9 15.7 16.6 16.7  HCT 49.2 44.5 46.8 48.1  MCV 90.3 87.8 88.0 88.9  PLT 116* 138* 139* 155   Basic Metabolic Panel: Recent Labs  Lab 01/19/24 1245 01/20/24 0539 01/21/24 0620 01/22/24 0701  NA 136 131* 136 136  K 4.1 3.8 4.4 5.0  CL 105 102 103 101  CO2 20* 20* 24 26  GLUCOSE 92 109* 94 99  BUN 15 12 12 15   CREATININE 0.86 0.79 0.81 0.99  CALCIUM 9.5 9.2 9.6 9.8  PHOS  --   --  3.7 3.6   Liver Function Tests: Recent Labs  Lab 01/21/24 0620 01/22/24 0701  ALBUMIN 3.5 3.5   CBG: No results for input(s): GLUCAP in the last 168 hours.  Discharge time spent: greater than 30 minutes.  Signed: Nydia Distance, MD [facility] 01/22/2024

## 2024-01-22 NOTE — Progress Notes (Signed)
 AVS teaching done with Debby pt verbalized understanding that he is to not use his CPAP machine for three days and to leave chest dressing on for three days. Pt educated to not submerge in water or remove dressing on chest for three days. Pt aware of reasons to contact MD, or call 911. Pt wife picked pt up downstairs, WC to lobby for DC.

## 2024-01-22 NOTE — Progress Notes (Signed)
 SATURATION QUALIFICATIONS: (This note is used to comply with regulatory documentation for home oxygen)  Patient Saturations on Room Air at Rest = 94%  Patient Saturations on Room Air while Ambulating = 95%  Patient Saturations on 0 Liters of oxygen while Ambulating = 94%  Please briefly explain why patient needs home oxygen:

## 2024-01-22 NOTE — Plan of Care (Signed)
  Problem: Education: Goal: Knowledge of General Education information will improve Description: Including pain rating scale, medication(s)/side effects and non-pharmacologic comfort measures Outcome: Progressing   Problem: Health Behavior/Discharge Planning: Goal: Ability to manage health-related needs will improve Outcome: Progressing   Problem: Clinical Measurements: Goal: Ability to maintain clinical measurements within normal limits will improve Outcome: Progressing Goal: Will remain free from infection Outcome: Progressing Goal: Diagnostic test results will improve Outcome: Progressing Goal: Respiratory complications will improve Outcome: Progressing Goal: Cardiovascular complication will be avoided Outcome: Progressing   Problem: Activity: Goal: Risk for activity intolerance will decrease Outcome: Progressing   Problem: Nutrition: Goal: Adequate nutrition will be maintained Outcome: Progressing   Problem: Coping: Goal: Level of anxiety will decrease Outcome: Progressing   Problem: Elimination: Goal: Will not experience complications related to bowel motility Outcome: Progressing Goal: Will not experience complications related to urinary retention Outcome: Progressing   Problem: Pain Managment: Goal: General experience of comfort will improve and/or be controlled Outcome: Progressing   Problem: Safety: Goal: Ability to remain free from injury will improve Outcome: Progressing   Problem: Skin Integrity: Goal: Risk for impaired skin integrity will decrease Outcome: Progressing   Problem: Education: Goal: Knowledge of risk factors and measures for prevention of condition will improve Outcome: Progressing   Problem: Coping: Goal: Psychosocial and spiritual needs will be supported Outcome: Progressing   Problem: Respiratory: Goal: Will maintain a patent airway Outcome: Progressing Goal: Complications related to the disease process, condition or treatment  will be avoided or minimized Outcome: Progressing

## 2024-01-22 NOTE — Plan of Care (Signed)
  Problem: Education: Goal: Knowledge of General Education information will improve Description: Including pain rating scale, medication(s)/side effects and non-pharmacologic comfort measures Outcome: Adequate for Discharge   Problem: Health Behavior/Discharge Planning: Goal: Ability to manage health-related needs will improve Outcome: Adequate for Discharge   Problem: Clinical Measurements: Goal: Ability to maintain clinical measurements within normal limits will improve Outcome: Adequate for Discharge Goal: Will remain free from infection Outcome: Adequate for Discharge Goal: Diagnostic test results will improve Outcome: Adequate for Discharge Goal: Respiratory complications will improve Outcome: Adequate for Discharge Goal: Cardiovascular complication will be avoided Outcome: Adequate for Discharge   Problem: Activity: Goal: Risk for activity intolerance will decrease Outcome: Adequate for Discharge   Problem: Nutrition: Goal: Adequate nutrition will be maintained Outcome: Adequate for Discharge   Problem: Coping: Goal: Level of anxiety will decrease Outcome: Adequate for Discharge   Problem: Elimination: Goal: Will not experience complications related to bowel motility Outcome: Adequate for Discharge Goal: Will not experience complications related to urinary retention Outcome: Adequate for Discharge   Problem: Pain Managment: Goal: General experience of comfort will improve and/or be controlled Outcome: Adequate for Discharge   Problem: Safety: Goal: Ability to remain free from injury will improve Outcome: Adequate for Discharge   Problem: Skin Integrity: Goal: Risk for impaired skin integrity will decrease Outcome: Adequate for Discharge   Problem: Education: Goal: Knowledge of risk factors and measures for prevention of condition will improve Outcome: Adequate for Discharge   Problem: Coping: Goal: Psychosocial and spiritual needs will be  supported Outcome: Adequate for Discharge   Problem: Respiratory: Goal: Will maintain a patent airway Outcome: Adequate for Discharge Goal: Complications related to the disease process, condition or treatment will be avoided or minimized Outcome: Adequate for Discharge

## 2024-01-22 NOTE — Progress Notes (Signed)
 NAME:  Chad Knapp, MRN:  [id number], DOB:  12-11-1950, LOS: 2 ADMISSION DATE:  01/19/2024, CONSULTATION DATE:  01/22/24 REFERRING MD:  TRH, CHIEF COMPLAINT:  PTX   History of Present Illness:  73 year old man history of right upper lobe lung nodule turned out to be lymphoma on biopsy here after COVID an acute episode of short chest pain seen at the [place] with chest x-ray revealing pneumothorax.  Usual state of health.  Got sick.  Coughing.  Diagnosed with COVID.  Randomly and for short period time sharp pain in the chest.  Radiated to back.  Went to [place] for evaluation.  Chest x-ray revealed pneumothorax.  He was instructed to present to the ED.  In the ED about 3.5 cm apical pneumothorax, smaller laterally.  Observed overnight.  Largely asymptomatic.  But increase in size.  Prompting chest tube placement 7/30.  Pertinent  Medical History  Lymphoma  Significant Hospital Events: Including procedures, antibiotic start and stop dates in addition to other pertinent events   7/29 admitted with pneumothorax asymptomatic 7/30 enlargement pneumothorax, chest tube placed 7/31 chest tube to water seal  Interim History / Subjective:  Feels good, no pain. Coughing up some sputum.   Objective    Blood pressure (!) 141/80, pulse 79, temperature 97.6 F (36.4 C), temperature source Oral, resp. rate 16, height 6' 3 (1.905 m), weight 116.6 kg, SpO2 97%.        Intake/Output Summary (Last 24 hours) at 01/22/2024 0902 Last data filed at 01/22/2024 0000 Gross per 24 hour  Intake 10 ml  Output 10 ml  Net 0 ml   Filed Weights   01/19/24 1251  Weight: 116.6 kg    Examination: General: Sitting up in the bed in no acute distress HENT: moist mm, sclera anicterc Lungs: Clear bilaterally, R apical CT in place without air leak Cardiovascular: Regular rate and rhythm Abdomen: Nondistended Extremities: Edema noted, significant scarring over left shin Neuro: alert and oriented  Resolved problem list    Assessment and Plan   Presumed primary spontaneous pneumothorax: In setting of COVID infection OSA on CPAP - chest xray stable on waterseal on ptx.  - d/c chest tube - repeat chest xray in 4 hours.  - if stable ok for discharge from pulmonary perspective - recommend non cpap therapy for next 72 hours due to pneumothorax. Can resume home settings after that.   Verdon Gore, MD Pulmonary and Critical Care Medicine [facility] 01/22/2024 9:04 AM Pager: see AMION  If no response to pager, please call critical care on call (see AMION) until 7pm After 7:00 pm call Elink     Best Practice (right click and Reselect all SmartList Selections daily)   Per primary Labs   CBC: Recent Labs  Lab 01/19/24 1245 01/20/24 0539 01/21/24 0620 01/22/24 0701  WBC 8.7 8.1 7.8 8.0  NEUTROABS 6.3  --   --   --   HGB 16.9 15.7 16.6 16.7  HCT 49.2 44.5 46.8 48.1  MCV 90.3 87.8 88.0 88.9  PLT 116* 138* 139* 155    Basic Metabolic Panel: Recent Labs  Lab 01/19/24 1245 01/20/24 0539 01/21/24 0620 01/22/24 0701  NA 136 131* 136 136  K 4.1 3.8 4.4 5.0  CL 105 102 103 101  CO2 20* 20* 24 26  GLUCOSE 92 109* 94 99  BUN 15 12 12 15   CREATININE 0.86 0.79 0.81 0.99  CALCIUM 9.5 9.2 9.6 9.8  PHOS  --   --  3.7  3.6   GFR: Estimated Creatinine Clearance: 91.5 mL/min (by C-G formula based on SCr of 0.99 mg/dL). Recent Labs  Lab 01/19/24 1245 01/20/24 0539 01/21/24 0620 01/22/24 0701  WBC 8.7 8.1 7.8 8.0    Liver Function Tests: Recent Labs  Lab 01/21/24 0620 01/22/24 0701  ALBUMIN 3.5 3.5   No results for input(s): LIPASE, AMYLASE in the last 168 hours. No results for input(s): AMMONIA in the last 168 hours.  ABG    Component Value Date/Time   HCO3 21.4 08/30/2021 1548   TCO2 22 08/30/2021 1548   O2SAT 91 08/30/2021 1548     Coagulation Profile: No results for input(s): INR, PROTIME in the last 168 hours.  Cardiac Enzymes: No results for input(s):  CKTOTAL, CKMB, CKMBINDEX, TROPONINI in the last 168 hours.  HbA1C: No results found for: HGBA1C  CBG: No results for input(s): GLUCAP in the last 168 hours.

## 2024-01-22 NOTE — Progress Notes (Signed)
 [facility]                                                                              Chad Knapp, is a 73 y.o. male, DOB - Jun 19, 1951, FMW:[id number] Admit date - 01/19/2024    Outpatient Primary MD for the patient is Tanda Bleacher, MD  LOS - 2  days  Chief Complaint  Patient presents with   Shortness of Breath       Brief summary   Patient is a [name] with obesity, OSA on CPAP, extranodal pulmonary marginal zone lymphoma, HTN, Parkinson's presented with chest pain. He was recently diagnosed with COVID and started on Paxlovid . Was feeling better, finishing the Paxlovid  soon, but today woke with new chest pain and dyspnea.  Went to his [place] doctor, they did a CXR, saw a right apical pneumo and recommended he come to the hospital. In the ER, patient was hemodynamically stable, saturating well on room air. Chest x-ray showed bilateral opacities and small right apical PTX.  Pulm consulted, recommended conservative mgmt without chest tube and observation.   Assessment & Plan     Pneumothorax - In the setting of marginal zone lymphoma of the lung, recent COVID. - Hemodynamically stable, no worsening hypoxia however chest x-ray this morning showed increased volume of right-sided pneumothorax measuring 5.8 cm in thickness over the right apex compared to 3.5 cm on the previous exam -Pulmonology following, chest tube was placed on 7/30 - Chest tube to be removed today and repeat chest x-ray in 4 hours -If stable, recommended no CPAP for for next 72 hours, then can resume home settings after that       COVID-19 virus infection Resolving symptoms at the time of presentation.   - Completed Paxlovid  on 01/19/2024, no further treatment needed - Abx given empirically in the ER, but no fever, leukocytosis to suggest infection, hence antibiotics were discontinued. - Stable, no issues      Sleep apnea - Hold CPAP given PTX - CXR in 4 hrs, if stable, recommended no  CPAP for for next 72 hours, then can resume home settings after that   Parkinsonism, unspecified (HCC) - Continue rasagiline  and Sinemet  9a,1p,5p   Essential hypertension - Current losartan    Marginal zone lymphoma (HCC) Follows with Dr. Onesimo   Thrombocytopenia (HCC) Mild. Due to lymphoma   Obesity class I Estimated body mass index is 32.12 kg/m as calculated from the following:   Height as of this encounter: 6' 3 (1.905 m).   Weight as of this encounter: 116.6 kg.  Code Status: Full code DVT Prophylaxis:  enoxaparin  (LOVENOX ) injection 40 mg Start: 01/19/24 2200   Level of Care: Level of care: Telemetry Medical Family Communication: Updated patient Disposition Plan:      Remains inpatient appropriate:      Procedures:  Chest tube placement  Consultants:   Pulmonology  Antimicrobials:   Anti-infectives (From admission, onward)    Start     Dose/Rate Route Frequency Ordered Stop   01/19/24 1430  cefTRIAXone  (ROCEPHIN ) 1 g in sodium chloride  0.9 % 100 mL IVPB  1 g 200 mL/hr over 30 Minutes Intravenous  Once 01/19/24 1421 01/19/24 1600   01/19/24 1430  azithromycin  (ZITHROMAX ) 500 mg in sodium chloride  0.9 % 250 mL IVPB  Status:  Discontinued        500 mg 250 mL/hr over 60 Minutes Intravenous  Once 01/19/24 1421 01/19/24 1956          Medications  arformoterol   15 mcg Nebulization BID   And   umeclidinium bromide   1 puff Inhalation Daily   aspirin   81 mg Oral Daily   bisacodyl   5 mg Oral Daily   carbidopa -levodopa   2.5 tablet Oral TID   enoxaparin  (LOVENOX ) injection  40 mg Subcutaneous Q24H   losartan   50 mg Oral Daily   melatonin  5 mg Oral QHS   mirabegron  ER  25 mg Oral Daily   pantoprazole   40 mg Oral Daily   polyethylene glycol  17 g Oral Daily   propranolol   20 mg Oral TID   rasagiline   1 mg Oral Daily   sodium chloride  flush  10 mL Intrapleural Q8H   tamsulosin   0.4 mg Oral QHS      Subjective:   Chad Knapp was seen and  examined today.  No acute complaints, seem this am, eating breakfast.  Objective:   Vitals:   01/21/24 2026 01/21/24 2113 01/22/24 0358 01/22/24 0700  BP: 119/63 119/63 (!) 132/93 (!) 141/80  Pulse: 82 82 82 79  Resp: 16  18 16   Temp: 97.7 F (36.5 C)  97.6 F (36.4 C) 97.6 F (36.4 C)  TempSrc: Oral  Oral Oral  SpO2: 96%  96% 97%  Weight:      Height:        Intake/Output Summary (Last 24 hours) at 01/22/2024 1141 Last data filed at 01/22/2024 0000 Gross per 24 hour  Intake 10 ml  Output 10 ml  Net 0 ml     Wt Readings from Last 3 Encounters:  01/19/24 116.6 kg  01/13/24 116.6 kg  07/08/23 122.8 kg   Physical Exam General: Alert and oriented x 3, NAD Cardiovascular: S1 S2 clear, RRR.  Respiratory: Diminished BS at the bases, chest tube+ Gastrointestinal: Soft, nontender, nondistended, NBS Ext: no pedal edema bilaterally Neuro: no new deficits Skin: No rashes Psych: Normal affect   Blood cell  Data Reviewed:  I have personally reviewed following labs    CBC Lab Results  Component Value Date   WBC 8.0 01/22/2024   RBC 5.41 01/22/2024   HGB 16.7 01/22/2024   HCT 48.1 01/22/2024   MCV 88.9 01/22/2024   MCH 30.9 01/22/2024   PLT 155 01/22/2024   MCHC 34.7 01/22/2024   RDW 12.8 01/22/2024   LYMPHSABS 1.4 01/19/2024   MONOABS 0.7 01/19/2024   EOSABS 0.2 01/19/2024   BASOSABS 0.0 01/19/2024     Last metabolic panel Lab Results  Component Value Date   NA 136 01/22/2024   K 5.0 01/22/2024   CL 101 01/22/2024   CO2 26 01/22/2024   BUN 15 01/22/2024   CREATININE 0.99 01/22/2024   GLUCOSE 99 01/22/2024   GFRNONAA >60 01/22/2024   GFRAA >60 06/13/2019   CALCIUM 9.8 01/22/2024   PHOS 3.6 01/22/2024   PROT 6.8 07/08/2023   ALBUMIN 3.5 01/22/2024   LABGLOB 2.7 07/08/2023   AGRATIO 1.5 07/03/2016   BILITOT 0.4 07/08/2023   ALKPHOS 85 07/08/2023   AST 19 07/08/2023   ALT 25 07/08/2023   ANIONGAP 9 01/22/2024    CBG (  last 3)  No results for  input(s): GLUCAP in the last 72 hours.    Coagulation Profile: No results for input(s): INR, PROTIME in the last 168 hours.   Radiology Studies: I have personally reviewed the imaging studies  DG CHEST PORT 1 VIEW Result Date: 01/22/2024 CLINICAL DATA:  Pneumothorax EXAM: PORTABLE CHEST 1 VIEW COMPARISON:  Chest x-ray performed January 21, 2024 FINDINGS: A right-sided pigtail catheter is present in similar orientation. Bibasilar airspace consolidation is similar. No definite pneumothorax. IMPRESSION: 1. Right chest tube in place, no definite pneumothorax. Electronically Signed   By: Maude Naegeli M.D.   On: 01/22/2024 08:33   DG CHEST PORT 1 VIEW Result Date: 01/21/2024 CLINICAL DATA:  Chest tube placement EXAM: PORTABLE CHEST 1 VIEW COMPARISON:  01/21/2024 FINDINGS: RIGHT chest tube unchanged. No pneumothorax. Elevation LEFT hemidiaphragm. LEFT lung clear. Anterior cervical fusion. IMPRESSION: 1. No interval change. 2. RIGHT chest tube in place.  No pneumothorax. Electronically Signed   By: Jackquline Boxer M.D.   On: 01/21/2024 16:22   DG CHEST PORT 1 VIEW Result Date: 01/21/2024 CLINICAL DATA:  [id number] Chest tube in place [id number] EXAM: PORTABLE CHEST - 1 VIEW COMPARISON:  January 20, 2024, January 19, 2024 FINDINGS: Similarly positioned small bore right thoracostomy tube, pigtail coiled in the right lung base. Elevation of the left hemidiaphragm with left basilar atelectasis. No focal airspace consolidation, pleural effusion, or pneumothorax. The nodular opacity in the right lung base on the prior x-ray is less conspicuous on today's exam. No cardiomegaly. Cervical fusion hardware again noted. IMPRESSION: Similarly positioned right-sided thoracostomy tube. No pneumothorax. Electronically Signed   By: Rogelia Myers M.D.   On: 01/21/2024 10:30       Olivette Beckmann M.D. [facility] 01/22/2024, 11:41 AM  Available via Epic secure chat 7am-7pm After 7 pm, please refer to night coverage  provider listed on amion.

## 2024-01-24 LAB — CULTURE, BLOOD (ROUTINE X 2)
Culture: NO GROWTH
Culture: NO GROWTH
Special Requests: ADEQUATE

## 2024-01-25 ENCOUNTER — Telehealth: Payer: Self-pay

## 2024-01-25 NOTE — Transitions of Care (Post Inpatient/ED Visit) (Signed)
 01/25/2024  Name: Chad Knapp MRN: [id number] DOB: 06/09/1951  Today's TOC FU Call Status: Today's TOC FU Call Status:: Successful TOC FU Call Completed TOC FU Call Complete Date: 01/25/24 Patient's Name and Date of Birth confirmed.  Transition Care Management Follow-up Telephone Call Date of Discharge: 01/22/24 Discharge Facility: [facility]) Type of Discharge: Inpatient Admission Primary Inpatient Discharge Diagnosis:: pnemothorax How have you been since you were released from the hospital?: Better Any questions or concerns?: No  Items Reviewed: Did you receive and understand the discharge instructions provided?: Yes Medications obtained,verified, and reconciled?: Yes (Medications Reviewed) Any new allergies since your discharge?: No Dietary orders reviewed?: Yes Do you have support at home?: Yes People in Home [RPT]: spouse  Medications Reviewed Today: Medications Reviewed Today     Reviewed by Emmitt Pan, LPN (Licensed Practical Nurse) on 01/25/24 at 1052  Med List Status: <None>   Medication Order Taking? Sig Documenting Provider Last Dose Status Informant  acetaminophen  (TYLENOL ) 500 MG tablet [id number] Yes Take 1,000 mg by mouth every 6 (six) hours as needed for moderate pain. [provider]  Active Self  albuterol  (PROVENTIL  HFA;VENTOLIN  HFA) 108 (90 BASE) MCG/ACT inhaler [id number] Yes Inhale 2 puffs into the lungs every 4 (four) hours as needed for wheezing or shortness of breath. [provider]  Active Self  ASPIRIN  81 PO [id number] Yes Take 1 tablet by mouth daily. [provider]  Active Self  camphor-menthol  VIKKI) lotion [id number] Yes Apply 1 Application topically as needed for itching. [provider]  Active Self  carbidopa -levodopa  (SINEMET  IR) 25-100 MG tablet [id number] Yes Take 2.5 tablets by mouth 3 (three) times daily. [provider]  Active Self           Med Note (SATTERFIELD, DARIUS E   Tue Jan 19, 2024  8:21 PM) 9am, 1pm , 5pm  carboxymethylcellulose (REFRESH PLUS) 0.5 % SOLN [id number] Yes Place 1 drop into both eyes 4 (four) times daily. [provider]  Active Self  famotidine (PEPCID) 40 MG tablet [id number] Yes Take 40 mg by mouth 2 (two) times daily. [provider]  Active Self  fluticasone  (FLONASE ) 50 MCG/ACT nasal spray [id number] Yes Place 2 sprays into the nose daily as needed for rhinitis or allergies. [provider]  Active Self  ibuprofen (ADVIL) 800 MG tablet [id number] Yes Take 800 mg by mouth every 8 (eight) hours as needed for moderate pain. [provider]  Active Self  loratadine (ALLERGY) 10 MG tablet [id number] Yes Take 10 mg by mouth daily. [employer] brand [provider]  Active Self  losartan  (COZAAR ) 100 MG tablet [id number] Yes Take 50 mg by mouth daily. [provider]  Active Self  mirabegron  ER (MYRBETRIQ ) 25 MG TB24 tablet [id number] Yes Take 25 mg by mouth daily. [provider]  Active Self  Multiple Vitamin (MULTIVITAMIN WITH MINERALS) TABS tablet [id number] Yes Take 1 tablet by mouth daily. [provider]  Active Self  Omega-3 Fatty Acids (OMEGA 3 PO) [id number] Yes Take 600 mg by mouth daily. [provider]  Active Self  pantoprazole  (PROTONIX ) 40 MG tablet [id number] Yes Take 40 mg by mouth daily. [provider]  Active Self  propranolol  (INDERAL ) 20 MG tablet [id number] Yes Take 20 mg by mouth 3 (three) times daily. [provider]  Active Self  Psyllium (METAMUCIL PO) [id number] Yes Take 1 packet by mouth daily. [provider]  Active Self  rasagiline  (AZILECT ) 1 MG TABS  tablet [id number] Yes TAKE ONE TABLET BY MOUTH ONCE A DAY TO SLOW PARKINSON'S DISEASE - NOT A DOSE CHANGE [provider]  Active Self           Med Note DONEL VERNEITA ORN   Mon Jan 06, 2022  1:33 PM) Takes daily at noon time  Spacer/Aero-Holding [facility] DIAMOND)  ESPIRIDION [id number] Yes See admin instructions. [provider]  Active Self  tamsulosin  (FLOMAX ) 0.4 MG CAPS capsule [id number] Yes Take 1 capsule (0.4 mg total) by mouth daily.  Patient taking differently: Take 0.4 mg by mouth daily after supper.   Lorren Greig PARAS, NP  Active Self  Tiotropium Bromide-Olodaterol (STIOLTO RESPIMAT ) 2.5-2.5 MCG/ACT AERS [id number] Yes Inhale 2 puffs into the lungs daily. Ruthell Lauraine FALCON, NP  Active Self            Home Care and Equipment/Supplies: Were Home Health Services Ordered?: NA Any new equipment or medical supplies ordered?: NA  Functional Questionnaire: Do you need assistance with bathing/showering or dressing?: No Do you need assistance with meal preparation?: No Do you need assistance with eating?: No Do you have difficulty maintaining continence: No Do you need assistance with getting out of bed/getting out of a chair/moving?: No Do you have difficulty managing or taking your medications?: No  Follow up appointments reviewed: PCP Follow-up appointment confirmed?: Yes Date of PCP follow-up appointment?: 02/03/24 Follow-up Provider: [facility] Follow-up appointment confirmed?: NA Do you need transportation to your follow-up appointment?: No Do you understand care options if your condition(s) worsen?: Yes-patient verbalized understanding    SIGNATURE Julian Lemmings, LPN [facility] Nurse Health Advisor Direct Dial [phone]

## 2024-02-03 ENCOUNTER — Ambulatory Visit (INDEPENDENT_AMBULATORY_CARE_PROVIDER_SITE_OTHER): Admitting: Family Medicine

## 2024-02-03 ENCOUNTER — Encounter: Payer: Self-pay | Admitting: Family Medicine

## 2024-02-03 VITALS — BP 132/92 | HR 93 | Ht 75.0 in | Wt 261.0 lb

## 2024-02-03 DIAGNOSIS — Z8709 Personal history of other diseases of the respiratory system: Secondary | ICD-10-CM | POA: Diagnosis not present

## 2024-02-03 DIAGNOSIS — Z09 Encounter for follow-up examination after completed treatment for conditions other than malignant neoplasm: Secondary | ICD-10-CM | POA: Diagnosis not present

## 2024-02-03 NOTE — Progress Notes (Signed)
 Established Patient Office Visit  Subjective    Patient ID: Chad Knapp, male    DOB: 1951-03-25  Age: 73 y.o. MRN: [id number]  CC:  Chief Complaint  Patient presents with   Hospitalization Follow-up    HPI Chad Knapp presents for hospital discharge follow up where he was admitted with pneumothorax. Patient feeling well now.   Outpatient Encounter Medications as of 02/03/2024  Medication Sig   acetaminophen  (TYLENOL ) 500 MG tablet Take 1,000 mg by mouth every 6 (six) hours as needed for moderate pain.   albuterol  (PROVENTIL  HFA;VENTOLIN  HFA) 108 (90 BASE) MCG/ACT inhaler Inhale 2 puffs into the lungs every 4 (four) hours as needed for wheezing or shortness of breath.   ASPIRIN  81 PO Take 1 tablet by mouth daily.   camphor-menthol  (SARNA) lotion Apply 1 Application topically as needed for itching.   carbidopa -levodopa  (SINEMET  IR) 25-100 MG tablet Take 2.5 tablets by mouth 3 (three) times daily.   carboxymethylcellulose (REFRESH PLUS) 0.5 % SOLN Place 1 drop into both eyes 4 (four) times daily.   famotidine (PEPCID) 40 MG tablet Take 40 mg by mouth 2 (two) times daily.   fluticasone  (FLONASE ) 50 MCG/ACT nasal spray Place 2 sprays into the nose daily as needed for rhinitis or allergies.   ibuprofen (ADVIL) 800 MG tablet Take 800 mg by mouth every 8 (eight) hours as needed for moderate pain.   loratadine (ALLERGY) 10 MG tablet Take 10 mg by mouth daily. [employer] brand   losartan  (COZAAR ) 100 MG tablet Take 50 mg by mouth daily.   mirabegron  ER (MYRBETRIQ ) 25 MG TB24 tablet Take 25 mg by mouth daily.   Multiple Vitamin (MULTIVITAMIN WITH MINERALS) TABS tablet Take 1 tablet by mouth daily.   Omega-3 Fatty Acids (OMEGA 3 PO) Take 600 mg by mouth daily.   pantoprazole  (PROTONIX ) 40 MG tablet Take 40 mg by mouth daily.   propranolol  (INDERAL ) 20 MG tablet Take 20 mg by mouth 3 (three) times daily.   Psyllium (METAMUCIL PO) Take 1 packet by mouth daily.   rasagiline  (AZILECT ) 1  MG TABS tablet TAKE ONE TABLET BY MOUTH ONCE A DAY TO SLOW PARKINSON'S DISEASE - NOT A DOSE CHANGE   Spacer/Aero-Holding Chambers (OPTICHAMBER DIAMOND) DEVI See admin instructions.   tamsulosin  (FLOMAX ) 0.4 MG CAPS capsule Take 1 capsule (0.4 mg total) by mouth daily. (Patient taking differently: Take 0.4 mg by mouth daily after supper.)   Tiotropium Bromide-Olodaterol (STIOLTO RESPIMAT ) 2.5-2.5 MCG/ACT AERS Inhale 2 puffs into the lungs daily.   No facility-administered encounter medications on file as of 02/03/2024.    Past Medical History:  Diagnosis Date   Anxiety 2015   Appendicitis    Arthritis 2000's   Cancer (HCC)    basal cell -removed - back, shoulder and chest, squamous cell on nose - removed   COVID 12/2021   no symptoms per pt.   Depression    GERD (gastroesophageal reflux disease) 2010   History of kidney stones    Hx of transfusion of packed red blood cells 06/23/1972   Hypertension    Lung cancer (HCC) 2023   Non Hodgkin's lymphoma (HCC)    Pneumonia    Shortness of breath    Skin cancer 1990's   Sleep apnea    cpap    Past Surgical History:  Procedure Laterality Date   ANTERIOR CERVICAL DECOMP/DISCECTOMY FUSION N/A 02/03/2013   Procedure: ACDF C6 - 7     1 LEVEL;  Surgeon: Donaciano  Burnetta, MD;  Location: [facility] OR;  Service: Orthopedics;  Laterality: N/A;   APPENDECTOMY  1961   BASAL CELL CARCINOMA EXCISION     BRONCHIAL BIOPSY  01/07/2022   Procedure: BRONCHIAL BIOPSIES;  Surgeon: Brenna Adine CROME, DO;  Location: [facility] ENDOSCOPY;  Service: Pulmonary;;   BRONCHIAL NEEDLE ASPIRATION BIOPSY  01/07/2022   Procedure: BRONCHIAL NEEDLE ASPIRATION BIOPSIES;  Surgeon: Brenna Adine CROME, DO;  Location: [facility] ENDOSCOPY;  Service: Pulmonary;;   CERVICAL FUSION  02/03/2013   Dr Burnetta   FRACTURE SURGERY Left 1974   tib/fib/femur   SPINE SURGERY  2014   TESTICLE REMOVAL Left    TONSILLECTOMY      Family History  Problem Relation Age of Onset   Mental illness Mother     Dementia Mother    Other Father        heart attack or stroke   Melanoma Brother     Social History   Socioeconomic History   Marital status: Married    Spouse name: Not on file   Number of children: Not on file   Years of education: Not on file   Highest education level: Not on file  Occupational History   Occupation: retired  Tobacco Use   Smoking status: Former    Current packs/day: 0.00    Average packs/day: 0.5 packs/day for 37.0 years (18.5 ttl pk-yrs)    Types: Cigarettes    Start date: 89    Quit date: 06/20/2007    Years since quitting: 16.6   Smokeless tobacco: Never  Vaping Use   Vaping status: Never Used  Substance and Sexual Activity   Alcohol use: Yes    Comment: maybe 2 beers a week   Drug use: Not Currently    Types: Marijuana    Comment: 01/06/22 - none for 6 months   Sexual activity: Not Currently  Other Topics Concern   Not on file  Social History Narrative   2 adopted children from [place], son and daughter   Social Drivers of Corporate investment banker Strain: Low Risk  (03/03/2023)   Overall Financial Resource Strain (CARDIA)    Difficulty of Paying Living Expenses: Not hard at all  Food Insecurity: No Food Insecurity (01/19/2024)   Hunger Vital Sign    Worried About Running Out of Food in the Last Year: Never true    Ran Out of Food in the Last Year: Never true  Transportation Needs: No Transportation Needs (01/19/2024)   PRAPARE - Administrator, Civil Service (Medical): No    Lack of Transportation (Non-Medical): No  Physical Activity: Insufficiently Active (03/03/2023)   Exercise Vital Sign    Days of Exercise per Week: 4 days    Minutes of Exercise per Session: 30 min  Stress: No Stress Concern Present (03/03/2023)   [employer] of Occupational Health - Occupational Stress Questionnaire    Feeling of Stress : Not at all  Social Connections: Moderately Integrated (01/19/2024)   Social Connection and Isolation Panel     Frequency of Communication with Friends and Family: More than three times a week    Frequency of Social Gatherings with Friends and Family: Three times a week    Attends Religious Services: More than 4 times per year    Active Member of Clubs or Organizations: No    Attends Banker Meetings: Never    Marital Status: Married  Catering manager Violence: Not At Risk (01/19/2024)   Humiliation, Afraid, Rape, and  Kick questionnaire    Fear of Current or Ex-Partner: No    Emotionally Abused: No    Physically Abused: No    Sexually Abused: No    Review of Systems  All other systems reviewed and are negative.       Objective    BP (!) 132/92   Pulse 93   Ht 6' 3 (1.905 m)   Wt 261 lb (118.4 kg)   SpO2 93%   BMI 32.62 kg/m   Physical Exam Vitals and nursing note reviewed.  Constitutional:      General: He is not in acute distress.    Appearance: He is obese.  Cardiovascular:     Rate and Rhythm: Normal rate and regular rhythm.  Pulmonary:     Effort: Pulmonary effort is normal.     Breath sounds: Normal breath sounds.  Abdominal:     Palpations: Abdomen is soft.     Tenderness: There is no abdominal tenderness.  Neurological:     General: No focal deficit present.     Mental Status: He is alert and oriented to person, place, and time.         Assessment & Plan:   Hospital discharge follow-up  History of pneumothorax   Symptoms much improved/resolved  No follow-ups on file.   Tanda Raguel SQUIBB, MD

## 2024-02-18 ENCOUNTER — Encounter: Payer: Self-pay | Admitting: Pulmonary Disease

## 2024-02-18 ENCOUNTER — Ambulatory Visit (INDEPENDENT_AMBULATORY_CARE_PROVIDER_SITE_OTHER)

## 2024-02-18 ENCOUNTER — Ambulatory Visit (INDEPENDENT_AMBULATORY_CARE_PROVIDER_SITE_OTHER): Admitting: Pulmonary Disease

## 2024-02-18 VITALS — BP 114/80 | HR 68 | Temp 97.8°F | Ht 72.0 in | Wt 266.0 lb

## 2024-02-18 DIAGNOSIS — J439 Emphysema, unspecified: Secondary | ICD-10-CM | POA: Diagnosis not present

## 2024-02-18 DIAGNOSIS — J4489 Other specified chronic obstructive pulmonary disease: Secondary | ICD-10-CM

## 2024-02-18 DIAGNOSIS — J9311 Primary spontaneous pneumothorax: Secondary | ICD-10-CM | POA: Diagnosis not present

## 2024-02-18 DIAGNOSIS — G4733 Obstructive sleep apnea (adult) (pediatric): Secondary | ICD-10-CM

## 2024-02-18 NOTE — Progress Notes (Signed)
 Chad Knapp    [id number]    [birth year]  Primary Care Physician:Wilson, Raguel, MD  Referring Physician: Davia Nydia POUR, MD [address] [place],  [place] [address]  Chief complaint: Posthospitalization follow-up after pneumothorax  HPI: 73 y.o. who  has a past medical history of Anxiety (2015), Appendicitis, Arthritis (2000's), Cancer (HCC), COVID (12/2021), Depression, GERD (gastroesophageal reflux disease) (2010), History of kidney stones, transfusion of packed red blood cells (06/23/1972), Hypertension, Lung cancer (HCC) (2023), Non Hodgkin's lymphoma (HCC), Pneumonia, Shortness of breath, Skin cancer (1990's), and Sleep apnea.  Discussed the use of AI scribe software for clinical note transcription with the patient, who gave verbal consent to proceed.  History of Present Illness Chad Knapp is a 73 year old male with a history of spontaneous pneumothorax and mantle cell lymphoma who presents for follow-up after recent hospitalization for COVID-19 and pneumothorax.  Recent covid-19 infection and spontaneous pneumothorax - Hospitalized recently for COVID-19 infection and spontaneous pneumothorax - Initial COVID-19 symptoms were mild, including slight rhinorrhea - Tested positive for COVID-19 and treated with Paxlovid  - Developed acute chest pressure described as 'somebody was sitting on my chest' - Presented to the [facility], where chest x-ray revealed a collapsed lung - Initial management with supplemental oxygen was unsuccessful - Chest tube placed and remained in situ until Thursday of hospitalization - Discharged after chest tube removal and satisfactory follow-up chest x-rays  Dyspnea and respiratory symptoms COPD - Shortness of breath during prolonged speaking - Currently using Stiolto inhaler  Pulmonary nodule and lymphoproliferative disease - Diagnosed with mantle cell lymphoma in 2023, presenting as a right upper lobe pulmonary nodule -  Nodule is small and being monitored without active treatment - No interval growth of the nodule on surveillance imaging - Follow-up with oncology every six months  Obstructive sleep apnea - History of obstructive sleep apnea managed with CPAP therapy - CPAP was paused during pneumothorax but has since been resumed without issues  Tobacco use - Significant smoking history of 20 pack-years - Quit smoking in 2008  Neurologic disease - History of Parkinson's disease   Outpatient Encounter Medications as of 02/18/2024  Medication Sig   acetaminophen  (TYLENOL ) 500 MG tablet Take 1,000 mg by mouth every 6 (six) hours as needed for moderate pain.   albuterol  (PROVENTIL  HFA;VENTOLIN  HFA) 108 (90 BASE) MCG/ACT inhaler Inhale 2 puffs into the lungs every 4 (four) hours as needed for wheezing or shortness of breath.   camphor-menthol  (SARNA) lotion Apply 1 Application topically as needed for itching.   carbidopa -levodopa  (SINEMET  IR) 25-100 MG tablet Take 2.5 tablets by mouth 3 (three) times daily.   carboxymethylcellulose (REFRESH PLUS) 0.5 % SOLN Place 1 drop into both eyes 4 (four) times daily.   famotidine (PEPCID) 40 MG tablet Take 40 mg by mouth 2 (two) times daily.   fluticasone  (FLONASE ) 50 MCG/ACT nasal spray Place 2 sprays into the nose daily as needed for rhinitis or allergies.   ibuprofen (ADVIL) 800 MG tablet Take 800 mg by mouth every 8 (eight) hours as needed for moderate pain.   loratadine (ALLERGY) 10 MG tablet Take 10 mg by mouth daily. [employer]   losartan  (COZAAR ) 100 MG tablet Take 50 mg by mouth daily.   mirabegron  ER (MYRBETRIQ ) 25 MG TB24 tablet Take 25 mg by mouth daily.   Multiple Vitamin (MULTIVITAMIN WITH MINERALS) TABS tablet Take 1 tablet by mouth daily.   Omega-3  Fatty Acids (OMEGA 3 PO) Take 600 mg by mouth daily.   pantoprazole  (PROTONIX ) 40 MG tablet Take 40 mg by mouth daily.   propranolol  (INDERAL ) 20 MG tablet Take 20 mg by mouth 3 (three) times daily.    Psyllium (METAMUCIL PO) Take 1 packet by mouth daily.   rasagiline  (AZILECT ) 1 MG TABS tablet TAKE ONE TABLET BY MOUTH ONCE A DAY TO SLOW PARKINSON'S DISEASE - NOT A DOSE CHANGE   Spacer/Aero-Holding Chambers (OPTICHAMBER DIAMOND) DEVI See admin instructions.   tamsulosin  (FLOMAX ) 0.4 MG CAPS capsule Take 1 capsule (0.4 mg total) by mouth daily.   Tiotropium Bromide-Olodaterol (STIOLTO RESPIMAT ) 2.5-2.5 MCG/ACT AERS Inhale 2 puffs into the lungs daily.   ASPIRIN  81 PO Take 1 tablet by mouth daily. (Patient not taking: Reported on 02/18/2024)   No facility-administered encounter medications on file as of 02/18/2024.     Physical Exam: Today's Vitals   02/18/24 1040  BP: 114/80  Pulse: 68  Temp: 97.8 F (36.6 C)  TempSrc: Oral  SpO2: 93%  Weight: 266 lb (120.7 kg)  Height: 6' (1.829 m)   Body mass index is 36.08 kg/m.  Physical Exam GEN: No acute distress. CV: Regular rate and rhythm, no murmurs. LUNGS: Clear to auscultation bilaterally, normal respiratory effort. SKIN JOINTS: Warm and dry, no rash.    Data Reviewed: Imaging: Chest x-ray 01/19/2024-right apical pneumothorax Chest x-ray 01/22/2024-no recurrence of pneumothorax after removal of chest tube I had reviewed images personally.  PFTs: 02/05/2022 FVC 4.19 [N 7%], FEV1 1.78 [44%], F/F42, TLC 7.53 [91%], DLCO 22.80 [94%] Severe obstruction, mild diffusion impairment Suboptimal test with poor effort and hesitation which may overestimate the degree of obstruction  Labs:  Assessment & Plan Follow-up after hospitalization for COVID-19 infection and spontaneous pneumothorax Recent hospitalization for COVID-19 infection and spontaneous pneumothorax. COVID-19 symptoms were mild with slight rhinorrhea. Treated with Paxlovid . Developed spontaneous pneumothorax, likely secondary to COVID-19, without significant coughing or sneezing. Pneumothorax resolved after chest tube placement. - Order chest x-ray  Spontaneous  pneumothorax, resolved Spontaneous pneumothorax occurred in the setting of COVID-19 infection. Resolved after chest tube placement. Discussed potential need for pleurodesis if pneumothorax recurs. Pleurodesis would involve attaching the lung to the chest wall to prevent recurrence.  Chronic obstructive pulmonary disease (COPD) Previous lung function test from August 2023 showed changes consistent with COPD, but test quality was suboptimal. Test indicated severe obstruction, but likely overestimated due to poor effort. Currently on Stiolto inhaler. No significant symptoms reported. His mother-in-law has COPD, raising concern for familial or environmental factors. - Consider repeating lung function test if symptoms worsen - Continue current management with Stiolto inhaler  Pulmonary mantle cell lymphoma under surveillance Diagnosed with right upper lobe nodule as mantle cell lymphoma in 2023. Currently under surveillance with no active treatment. Regular follow-ups with oncologist every six months. No growth observed in the nodule.  Obstructive sleep apnea Obstructive sleep apnea managed with CPAP. CPAP was held during pneumothorax but resumed after resolution.  Offered to make a follow-up appointment but he prefers to keep his care at the [facility] where all his doctors are. Return to clinic as needed if chest x-ray is okay  Recommendations: Chest x-ray Continue Stiolto, CPAP  Lonna Coder MD [facility] 02/18/2024, 11:08 AM  CC: Davia Nydia POUR, MD

## 2024-02-18 NOTE — Patient Instructions (Signed)
  VISIT SUMMARY: You had a follow-up appointment today after your recent hospitalization for COVID-19 and a collapsed lung (spontaneous pneumothorax). We discussed your recovery and ongoing management of your respiratory symptoms, as well as your other health conditions including COPD, mantle cell lymphoma, and obstructive sleep apnea.  YOUR PLAN: -FOLLOW-UP AFTER HOSPITALIZATION FOR COVID-19 INFECTION AND SPONTANEOUS PNEUMOTHORAX: You were recently hospitalized for a COVID-19 infection and a collapsed lung. Your COVID-19 symptoms were mild, and you were treated with Paxlovid . The collapsed lung was likely caused by the COVID-19 infection and was treated with a chest tube. We will order a chest x-ray to ensure your lung has fully healed. Please follow up at the [facility] after the chest x-ray results are available.  -SPONTANEOUS PNEUMOTHORAX, RESOLVED: A spontaneous pneumothorax is a sudden collapsed lung without any obvious cause. Your pneumothorax has resolved after treatment with a chest tube. If this happens again, we may need to consider a procedure called pleurodesis, which attaches the lung to the chest wall to prevent it from collapsing again.  -CHRONIC OBSTRUCTIVE PULMONARY DISEASE (COPD): COPD is a chronic lung disease that makes it hard to breathe. Your previous lung function test showed changes consistent with COPD, but the test quality was not optimal. You are currently using the Stiolto inhaler. If your symptoms worsen, we may need to repeat the lung function test. Continue using your inhaler as prescribed.  -PULMONARY MANTLE CELL LYMPHOMA UNDER SURVEILLANCE: Mantle cell lymphoma is a type of cancer that affects the lymph nodes and can appear as a nodule in the lungs. Your nodule is being monitored, and there has been no growth. You will continue to have regular follow-ups with your oncologist every six months.  -OBSTRUCTIVE SLEEP APNEA: Obstructive sleep apnea is a condition where your breathing  stops and starts during sleep. You are managing this with CPAP therapy, which you have resumed after your pneumothorax resolved. Continue using your CPAP machine as directed.  INSTRUCTIONS: Please get a chest x-ray to ensure your pneumothorax has fully resolved. Follow up at the [facility] for management of COPD, sleep apnea, lymphoma

## 2024-02-29 ENCOUNTER — Ambulatory Visit: Payer: Self-pay | Admitting: Pulmonary Disease
